# Patient Record
Sex: Female | Born: 1950
Health system: Southern US, Community
[De-identification: ages and names within clinical notes are randomized; demographics above are authoritative.]

## PROBLEM LIST (undated history)

## (undated) DIAGNOSIS — E039 Hypothyroidism, unspecified: Secondary | ICD-10-CM

## (undated) DIAGNOSIS — F1010 Alcohol abuse, uncomplicated: Secondary | ICD-10-CM

## (undated) DIAGNOSIS — Z952 Presence of prosthetic heart valve: Secondary | ICD-10-CM

## (undated) DIAGNOSIS — I350 Nonrheumatic aortic (valve) stenosis: Secondary | ICD-10-CM

## (undated) DIAGNOSIS — R011 Cardiac murmur, unspecified: Secondary | ICD-10-CM

## (undated) DIAGNOSIS — I7121 Aneurysm of the ascending aorta, without rupture: Secondary | ICD-10-CM

## (undated) DIAGNOSIS — I509 Heart failure, unspecified: Secondary | ICD-10-CM

## (undated) DIAGNOSIS — I959 Hypotension, unspecified: Secondary | ICD-10-CM

## (undated) DIAGNOSIS — J449 Chronic obstructive pulmonary disease, unspecified: Secondary | ICD-10-CM

## (undated) HISTORY — DX: Morbid (severe) obesity due to excess calories: E66.01

## (undated) HISTORY — DX: Cardiac murmur, unspecified: R01.1

## (undated) HISTORY — DX: Presence of prosthetic heart valve: Z95.2

---

## 2000-02-24 ENCOUNTER — Other Ambulatory Visit: Admission: RE | Admit: 2000-02-24 | Discharge: 2000-02-24 | Payer: Self-pay | Admitting: Obstetrics and Gynecology

## 2001-06-21 ENCOUNTER — Other Ambulatory Visit: Admission: RE | Admit: 2001-06-21 | Discharge: 2001-06-21 | Payer: Self-pay | Admitting: Obstetrics and Gynecology

## 2002-09-27 ENCOUNTER — Other Ambulatory Visit: Admission: RE | Admit: 2002-09-27 | Discharge: 2002-09-27 | Payer: Self-pay | Admitting: Obstetrics and Gynecology

## 2006-07-14 ENCOUNTER — Ambulatory Visit: Payer: Self-pay | Admitting: Family Medicine

## 2007-04-02 ENCOUNTER — Ambulatory Visit: Payer: Self-pay | Admitting: Family Medicine

## 2008-12-20 ENCOUNTER — Ambulatory Visit: Payer: Self-pay | Admitting: Family Medicine

## 2010-06-06 ENCOUNTER — Ambulatory Visit: Payer: Self-pay | Admitting: Family Medicine

## 2011-01-14 ENCOUNTER — Telehealth: Payer: Self-pay | Admitting: Family Medicine

## 2011-01-14 NOTE — Telephone Encounter (Signed)
Faxed refill for med 0 refills

## 2011-02-14 ENCOUNTER — Encounter: Payer: Self-pay | Admitting: Family Medicine

## 2011-02-14 DIAGNOSIS — J45909 Unspecified asthma, uncomplicated: Secondary | ICD-10-CM | POA: Insufficient documentation

## 2011-02-14 DIAGNOSIS — R011 Cardiac murmur, unspecified: Secondary | ICD-10-CM | POA: Insufficient documentation

## 2011-02-14 DIAGNOSIS — J309 Allergic rhinitis, unspecified: Secondary | ICD-10-CM | POA: Insufficient documentation

## 2011-02-14 DIAGNOSIS — F17200 Nicotine dependence, unspecified, uncomplicated: Secondary | ICD-10-CM

## 2011-03-12 ENCOUNTER — Other Ambulatory Visit: Payer: Self-pay | Admitting: Family Medicine

## 2011-03-13 ENCOUNTER — Other Ambulatory Visit: Payer: Self-pay

## 2011-03-13 MED ORDER — ALBUTEROL SULFATE HFA 108 (90 BASE) MCG/ACT IN AERS: 2.0000 | INHALATION_SPRAY | Freq: Four times a day (QID) | RESPIRATORY_TRACT | Status: DC | PRN

## 2011-03-14 ENCOUNTER — Telehealth: Payer: Self-pay | Admitting: Family Medicine

## 2011-03-14 NOTE — Telephone Encounter (Signed)
PT INFORMED-LM 

## 2011-07-16 ENCOUNTER — Other Ambulatory Visit: Payer: Self-pay | Admitting: Family Medicine

## 2011-07-16 NOTE — Telephone Encounter (Signed)
Is this okay?

## 2011-07-16 NOTE — Telephone Encounter (Signed)
Her inhaler with the night. She needs an appointment

## 2011-07-22 ENCOUNTER — Telehealth: Payer: Self-pay | Admitting: Family Medicine

## 2011-07-22 MED ORDER — ALBUTEROL SULFATE HFA 108 (90 BASE) MCG/ACT IN AERS: 2.0000 | INHALATION_SPRAY | Freq: Four times a day (QID) | RESPIRATORY_TRACT | Status: DC | PRN

## 2011-07-22 NOTE — Telephone Encounter (Signed)
Ventolin called in

## 2011-09-06 ENCOUNTER — Other Ambulatory Visit: Payer: Self-pay | Admitting: Family Medicine

## 2011-09-08 NOTE — Telephone Encounter (Signed)
She needs a follow-up appointment 

## 2011-09-08 NOTE — Telephone Encounter (Signed)
Is this okay?

## 2011-11-28 ENCOUNTER — Other Ambulatory Visit: Payer: Self-pay | Admitting: Family Medicine

## 2012-01-23 ENCOUNTER — Ambulatory Visit (INDEPENDENT_AMBULATORY_CARE_PROVIDER_SITE_OTHER): Payer: Self-pay | Admitting: Medical

## 2012-01-23 ENCOUNTER — Encounter: Payer: Self-pay | Admitting: Medical

## 2012-01-23 VITALS — BP 122/82 | HR 100 | Temp 98.0°F | Resp 16 | Wt 181.0 lb

## 2012-01-23 DIAGNOSIS — J029 Acute pharyngitis, unspecified: Secondary | ICD-10-CM

## 2012-01-23 DIAGNOSIS — J069 Acute upper respiratory infection, unspecified: Secondary | ICD-10-CM

## 2012-01-23 DIAGNOSIS — J45909 Unspecified asthma, uncomplicated: Secondary | ICD-10-CM

## 2012-01-23 MED ORDER — VENTOLIN HFA 108 (90 BASE) MCG/ACT IN AERS: 2.0000 | INHALATION_SPRAY | Freq: Four times a day (QID) | RESPIRATORY_TRACT | Status: DC | PRN

## 2012-01-23 MED ORDER — AMOXICILLIN 875 MG PO TABS: 875.0000 mg | ORAL_TABLET | Freq: Two times a day (BID) | ORAL | Status: AC

## 2012-01-23 NOTE — Progress Notes (Signed)
Subjective:  Cathy Moody is a 61 y.o. female who presents for 1 week hx/o cold symptoms. She reports cough, sometimes productive, runny nose, congestion, generally feeling ill, bad sore throat, headache, and occasional wheezing.   She notes 2 grandchildren recent sick contacts, 1 with strep +, 1 with bad sore throat, likely strep as well.  She is using Delsym with no improvement.   Needs refill on inhaler.  In the last week or so, having to use the inhaler more.  No other aggravating or relieving factors.  No other c/o.  The following portions of the patient's history were reviewed and updated as appropriate: allergies, current medications, past family history, past medical history, past social history, past surgical history and problem list.  ROS: Gen: no fever, chills, wt gain Heart: no CP, palpations, edema Lungs: occasional SOB; no calve pain, hemoptysis GI: negative  Objective:   Filed Vitals:   01/23/12 1111  BP: 122/82  Pulse: 100  Temp: 98 F (36.7 C)  Resp: 16    General appearance: Alert, WD/WN, no distress, ill appearing                             Skin: warm, no rash, no diaphoresis                           Head: no sinus tenderness                            Eyes: conjunctiva normal, corneas clear, PERRLA                            Ears: pearly TMs, external ear canals normal                          Nose: septum midline, turbinates swollen, with erythema             Mouth/throat: MMM, tongue normal, moderate pharyngeal erythema, +post nasal drainage                           Neck: supple, no adenopathy, no thyromegaly, non tender                          Heart: RRR, normal S1, S2, no murmurs                         Lungs: +bronchial breath sounds, +scattered rhonchi, no wheezes, no rales                Extremities: no edema, nontender     Assessment and Plan:   Encounter Diagnoses  Name Primary?  . URI (upper respiratory infection) Yes  . Pharyngitis   .  Asthma    Prescription given today for Amoxicillin as below.  Discussed diagnosis and treatment.  Suggested symptomatic OTC remedies for cough and congestion.  Nasal saline spray for nasal congestion.  Tylenol or Ibuprofen OTC for fever and malaise.  Refilled Ventolin.  Call/return in 2-3 days if symptoms are worse or not improving.    Advised to return soon for fasting labs and physical as she is past due.  Return if having to use inhaler more than  3 times weekly in general.

## 2012-02-25 ENCOUNTER — Other Ambulatory Visit: Payer: Self-pay | Admitting: Medical

## 2012-02-25 NOTE — Telephone Encounter (Signed)
Cathy Moody is this all right to refill.

## 2012-02-25 NOTE — Telephone Encounter (Signed)
Message copied by Janeice Robinson on Wed Feb 25, 2012  1:50 PM ------      Message from: Jac Canavan      Created: Wed Feb 25, 2012 12:07 PM       i sent refill as requested, but if using at least 3 times weekly need to come in and discussed better asthma control.  Also, did she get improved over the recent time i saw her?

## 2012-02-25 NOTE — Telephone Encounter (Signed)
Patient states that she did get better since her last visit. She states the reason why she requested a refill was because she is going out of town until August. [place]

## 2012-05-25 ENCOUNTER — Other Ambulatory Visit: Payer: Self-pay | Admitting: Medical

## 2012-05-25 MED ORDER — ALBUTEROL SULFATE HFA 108 (90 BASE) MCG/ACT IN AERS: 2.0000 | INHALATION_SPRAY | Freq: Four times a day (QID) | RESPIRATORY_TRACT | Status: DC | PRN

## 2012-05-25 NOTE — Telephone Encounter (Signed)
IS THIS OKAY REFILL? LAST OV WAS ON 12/2011

## 2012-05-25 NOTE — Telephone Encounter (Signed)
inhaler sent.  Due for CPX

## 2012-07-14 ENCOUNTER — Other Ambulatory Visit: Payer: Self-pay | Admitting: Medical

## 2012-07-15 ENCOUNTER — Telehealth: Payer: Self-pay | Admitting: Internal Medicine

## 2012-07-15 MED ORDER — ALBUTEROL SULFATE HFA 108 (90 BASE) MCG/ACT IN AERS: 2.0000 | INHALATION_SPRAY | Freq: Four times a day (QID) | RESPIRATORY_TRACT | Status: DC | PRN

## 2012-07-15 NOTE — Telephone Encounter (Signed)
Sent med in 

## 2012-08-19 ENCOUNTER — Other Ambulatory Visit: Payer: Self-pay | Admitting: Medical

## 2012-09-21 ENCOUNTER — Other Ambulatory Visit: Payer: Self-pay | Admitting: Medical

## 2012-09-21 NOTE — Telephone Encounter (Signed)
IS this okay to refill? 

## 2012-09-22 NOTE — Telephone Encounter (Signed)
I left the patient a message about her medication and that she is due for a CPX. CLS

## 2012-09-22 NOTE — Telephone Encounter (Signed)
Needs CPX appt. 

## 2012-11-25 ENCOUNTER — Other Ambulatory Visit: Payer: Self-pay | Admitting: Medical

## 2013-02-03 ENCOUNTER — Telehealth: Payer: Self-pay | Admitting: Internal Medicine

## 2013-02-03 NOTE — Telephone Encounter (Signed)
Pt states she needs a refill on ventolin. i denied it yesterday due she hasnt been in since 5/13 and she states that she will not be in town for another 6 weeks and wants to know if you will fill it. [facility] on [address]

## 2013-02-04 ENCOUNTER — Other Ambulatory Visit: Payer: Self-pay | Admitting: Internal Medicine

## 2013-02-04 ENCOUNTER — Other Ambulatory Visit: Payer: Self-pay | Admitting: Medical

## 2013-02-04 MED ORDER — ALBUTEROL SULFATE HFA 108 (90 BASE) MCG/ACT IN AERS: 2.0000 | INHALATION_SPRAY | Freq: Four times a day (QID) | RESPIRATORY_TRACT | Status: DC | PRN

## 2013-02-04 NOTE — Telephone Encounter (Signed)
She is due for general physical and if she needs to come in for asthma now we can work her in.  If absolutely needed, call out 1 inhaler, but we have to see patients at least once yearly regarding medicatoins.    (If she wants it sent to [facility], doesn't that mean she is in town?)

## 2013-02-04 NOTE — Telephone Encounter (Signed)
Sent in med. Pt knows she needs an appt and that's when she said she wont be in town for 6 more weeks

## 2013-02-04 NOTE — Telephone Encounter (Signed)
She said that she wanted it called in and her son i believe is going to see her and take it to her

## 2013-03-21 ENCOUNTER — Telehealth: Payer: Self-pay | Admitting: Medical

## 2013-03-21 NOTE — Telephone Encounter (Signed)
Needs appt, last visit over a year ago

## 2013-03-23 NOTE — Telephone Encounter (Signed)
CALLED PT TWICE TO LET HER KNOW THAT SHE NEEDS TO COME IN FOR APPT. LEFT MESSAGE TO CALL

## 2013-07-29 ENCOUNTER — Encounter: Payer: Self-pay | Admitting: Family Medicine

## 2013-07-29 ENCOUNTER — Encounter (HOSPITAL_COMMUNITY): Payer: Self-pay | Admitting: Emergency Medicine

## 2013-07-29 ENCOUNTER — Ambulatory Visit (INDEPENDENT_AMBULATORY_CARE_PROVIDER_SITE_OTHER): Payer: BC Managed Care – PPO | Admitting: Family Medicine

## 2013-07-29 ENCOUNTER — Inpatient Hospital Stay (HOSPITAL_COMMUNITY)
Admission: EM | Admit: 2013-07-29 | Discharge: 2013-08-13 | DRG: 266 | Disposition: A | Discharge status: Home / Self Care | Payer: BC Managed Care – PPO | Attending: Surgery | Admitting: Surgery

## 2013-07-29 ENCOUNTER — Emergency Department (HOSPITAL_COMMUNITY): Payer: BC Managed Care – PPO

## 2013-07-29 VITALS — BP 150/90 | HR 124 | Ht <= 58 in | Wt 189.0 lb

## 2013-07-29 DIAGNOSIS — J4489 Other specified chronic obstructive pulmonary disease: Secondary | ICD-10-CM | POA: Diagnosis present

## 2013-07-29 DIAGNOSIS — Z833 Family history of diabetes mellitus: Secondary | ICD-10-CM

## 2013-07-29 DIAGNOSIS — I2789 Other specified pulmonary heart diseases: Secondary | ICD-10-CM | POA: Diagnosis present

## 2013-07-29 DIAGNOSIS — F411 Generalized anxiety disorder: Secondary | ICD-10-CM | POA: Clinically undetermined

## 2013-07-29 DIAGNOSIS — I359 Nonrheumatic aortic valve disorder, unspecified: Secondary | ICD-10-CM | POA: Diagnosis present

## 2013-07-29 DIAGNOSIS — F1020 Alcohol dependence, uncomplicated: Secondary | ICD-10-CM | POA: Diagnosis present

## 2013-07-29 DIAGNOSIS — I5021 Acute systolic (congestive) heart failure: Secondary | ICD-10-CM

## 2013-07-29 DIAGNOSIS — F17201 Nicotine dependence, unspecified, in remission: Secondary | ICD-10-CM

## 2013-07-29 DIAGNOSIS — R9431 Abnormal electrocardiogram [ECG] [EKG]: Secondary | ICD-10-CM

## 2013-07-29 DIAGNOSIS — R0600 Dyspnea, unspecified: Secondary | ICD-10-CM

## 2013-07-29 DIAGNOSIS — J449 Chronic obstructive pulmonary disease, unspecified: Secondary | ICD-10-CM | POA: Diagnosis present

## 2013-07-29 DIAGNOSIS — L259 Unspecified contact dermatitis, unspecified cause: Secondary | ICD-10-CM | POA: Diagnosis present

## 2013-07-29 DIAGNOSIS — I428 Other cardiomyopathies: Secondary | ICD-10-CM | POA: Diagnosis present

## 2013-07-29 DIAGNOSIS — I509 Heart failure, unspecified: Secondary | ICD-10-CM | POA: Diagnosis present

## 2013-07-29 DIAGNOSIS — R609 Edema, unspecified: Secondary | ICD-10-CM

## 2013-07-29 DIAGNOSIS — Z952 Presence of prosthetic heart valve: Secondary | ICD-10-CM

## 2013-07-29 DIAGNOSIS — F1010 Alcohol abuse, uncomplicated: Secondary | ICD-10-CM | POA: Diagnosis present

## 2013-07-29 DIAGNOSIS — J45909 Unspecified asthma, uncomplicated: Secondary | ICD-10-CM

## 2013-07-29 DIAGNOSIS — D7589 Other specified diseases of blood and blood-forming organs: Secondary | ICD-10-CM | POA: Diagnosis present

## 2013-07-29 DIAGNOSIS — Z720 Tobacco use: Secondary | ICD-10-CM

## 2013-07-29 DIAGNOSIS — N390 Urinary tract infection, site not specified: Secondary | ICD-10-CM | POA: Diagnosis not present

## 2013-07-29 DIAGNOSIS — I350 Nonrheumatic aortic (valve) stenosis: Secondary | ICD-10-CM | POA: Diagnosis present

## 2013-07-29 DIAGNOSIS — I2510 Atherosclerotic heart disease of native coronary artery without angina pectoris: Secondary | ICD-10-CM | POA: Diagnosis present

## 2013-07-29 DIAGNOSIS — E0781 Sick-euthyroid syndrome: Secondary | ICD-10-CM | POA: Diagnosis not present

## 2013-07-29 DIAGNOSIS — I272 Pulmonary hypertension, unspecified: Secondary | ICD-10-CM | POA: Diagnosis present

## 2013-07-29 DIAGNOSIS — I5023 Acute on chronic systolic (congestive) heart failure: Principal | ICD-10-CM | POA: Diagnosis present

## 2013-07-29 DIAGNOSIS — Z006 Encounter for examination for normal comparison and control in clinical research program: Secondary | ICD-10-CM

## 2013-07-29 DIAGNOSIS — L408 Other psoriasis: Secondary | ICD-10-CM | POA: Diagnosis present

## 2013-07-29 DIAGNOSIS — R011 Cardiac murmur, unspecified: Secondary | ICD-10-CM

## 2013-07-29 DIAGNOSIS — I959 Hypotension, unspecified: Secondary | ICD-10-CM

## 2013-07-29 DIAGNOSIS — Z6835 Body mass index (BMI) 35.0-35.9, adult: Secondary | ICD-10-CM

## 2013-07-29 DIAGNOSIS — F17200 Nicotine dependence, unspecified, uncomplicated: Secondary | ICD-10-CM

## 2013-07-29 DIAGNOSIS — R0609 Other forms of dyspnea: Secondary | ICD-10-CM

## 2013-07-29 DIAGNOSIS — J9620 Acute and chronic respiratory failure, unspecified whether with hypoxia or hypercapnia: Secondary | ICD-10-CM | POA: Diagnosis present

## 2013-07-29 HISTORY — DX: Heart failure, unspecified: I50.9

## 2013-07-29 HISTORY — DX: Hypotension, unspecified: I95.9

## 2013-07-29 HISTORY — DX: Alcohol abuse, uncomplicated: F10.10

## 2013-07-29 LAB — BASIC METABOLIC PANEL
BUN: 9 mg/dL (ref 6–23)
Calcium: 9 mg/dL (ref 8.4–10.5)
GFR calc Af Amer: >90 mL/min (ref >90)
GFR calc non Af Amer: >90 mL/min (ref >90)
Glucose, Bld: 126 mg/dL — ABNORMAL HIGH (ref 70–99)
Potassium: 4.3 mEq/L (ref 3.5–5.1)

## 2013-07-29 LAB — CBC
HCT: 43.9 % (ref 36.0–46.0)
HCT: 44.5 % (ref 36.0–46.0)
MCV: 100 fL (ref 78.0–100.0)
MCV: 102.3 fL — ABNORMAL HIGH (ref 78.0–100.0)
Platelets: 330 10*3/uL (ref 150–400)
Platelets: 337 10*3/uL (ref 150–400)
RBC: 4.39 MIL/uL (ref 3.87–5.11)
RBC: 4.35 MIL/uL (ref 3.87–5.11)
RDW: 14.2 % (ref 11.5–15.5)
RDW: 14.1 % (ref 11.5–15.5)
WBC: 12 10*3/uL — ABNORMAL HIGH (ref 4.0–10.5)
WBC: 10.2 10*3/uL (ref 4.0–10.5)

## 2013-07-29 LAB — TSH: TSH: 7.306 u[IU]/mL — ABNORMAL HIGH (ref 0.350–4.500)

## 2013-07-29 LAB — TROPONIN I: Troponin I: <0.3 ng/mL (ref <0.30)

## 2013-07-29 LAB — HEMOGLOBIN A1C: Hgb A1c MFr Bld: 6.3 % — ABNORMAL HIGH (ref <5.7)

## 2013-07-29 LAB — CREATININE, SERUM
Creatinine, Ser: 0.82 mg/dL (ref 0.50–1.10)
GFR calc Af Amer: 88 mL/min — ABNORMAL LOW (ref >90)

## 2013-07-29 LAB — PRO B NATRIURETIC PEPTIDE: Pro B Natriuretic peptide (BNP): 26344 pg/mL — ABNORMAL HIGH (ref 0–125)

## 2013-07-29 MED ORDER — SODIUM CHLORIDE 0.9 % IV SOLN: 250.0000 mL | INTRAVENOUS | Status: DC | PRN

## 2013-07-29 MED ORDER — SODIUM CHLORIDE 0.9 % IJ SOLN
3.0000 mL | Freq: Two times a day (BID) | INTRAMUSCULAR | Status: DC
  Administered 2013-07-30 – 2013-08-01 (×3): 3 mL via INTRAVENOUS

## 2013-07-29 MED ORDER — FUROSEMIDE 10 MG/ML IJ SOLN: 40.0000 mg | Freq: Four times a day (QID) | INTRAMUSCULAR | Status: DC

## 2013-07-29 MED ORDER — ENOXAPARIN SODIUM 40 MG/0.4ML ~~LOC~~ SOLN
40.0000 mg | SUBCUTANEOUS | Status: DC
  Filled 2013-07-29: qty 0.4

## 2013-07-29 MED ORDER — LORAZEPAM 2 MG/ML IJ SOLN
1.0000 mg | Freq: Four times a day (QID) | INTRAMUSCULAR | Status: DC | PRN
  Administered 2013-07-30: 1 mg via INTRAVENOUS
  Filled 2013-07-29 (×2): qty 1

## 2013-07-29 MED ORDER — FUROSEMIDE 10 MG/ML IJ SOLN
80.0000 mg | Freq: Once | INTRAMUSCULAR | Status: AC
  Administered 2013-07-29: 80 mg via INTRAVENOUS
  Filled 2013-07-29: qty 8

## 2013-07-29 MED ORDER — VITAMIN B-1 100 MG PO TABS
100.0000 mg | ORAL_TABLET | Freq: Every day | ORAL | Status: DC
  Administered 2013-07-29 – 2013-07-31 (×3): 100 mg via ORAL
  Filled 2013-07-29 (×4): qty 1

## 2013-07-29 MED ORDER — FUROSEMIDE 10 MG/ML IJ SOLN: 40.0000 mg | Freq: Two times a day (BID) | INTRAMUSCULAR | Status: DC

## 2013-07-29 MED ORDER — FUROSEMIDE 10 MG/ML IJ SOLN
40.0000 mg | Freq: Two times a day (BID) | INTRAMUSCULAR | Status: DC
  Administered 2013-07-29 – 2013-07-31 (×4): 40 mg via INTRAVENOUS
  Filled 2013-07-29 (×8): qty 4

## 2013-07-29 MED ORDER — ONDANSETRON HCL 4 MG/2ML IJ SOLN: 4.0000 mg | Freq: Four times a day (QID) | INTRAMUSCULAR | Status: DC | PRN

## 2013-07-29 MED ORDER — ACETAMINOPHEN 325 MG PO TABS
650.0000 mg | ORAL_TABLET | ORAL | Status: DC | PRN
  Administered 2013-07-30 – 2013-08-06 (×9): 650 mg via ORAL
  Filled 2013-07-29 (×5): qty 2

## 2013-07-29 MED ORDER — ADULT MULTIVITAMIN W/MINERALS CH
1.0000 | ORAL_TABLET | Freq: Every day | ORAL | Status: DC
  Administered 2013-07-29 – 2013-08-05 (×7): 1 via ORAL
  Filled 2013-07-29 (×12): qty 1

## 2013-07-29 MED ORDER — POTASSIUM CHLORIDE CRYS ER 20 MEQ PO TBCR
20.0000 meq | EXTENDED_RELEASE_TABLET | Freq: Every day | ORAL | Status: DC
  Filled 2013-07-29: qty 1

## 2013-07-29 MED ORDER — LORAZEPAM 2 MG/ML IJ SOLN
0.5000 mg | INTRAMUSCULAR | Status: DC | PRN
  Administered 2013-07-29: 0.5 mg via INTRAVENOUS
  Filled 2013-07-29: qty 1

## 2013-07-29 MED ORDER — LORAZEPAM 1 MG PO TABS
1.0000 mg | ORAL_TABLET | Freq: Four times a day (QID) | ORAL | Status: DC | PRN
  Administered 2013-07-29: 1 mg via ORAL
  Filled 2013-07-29: qty 1

## 2013-07-29 MED ORDER — ASPIRIN 81 MG PO CHEW
324.0000 mg | CHEWABLE_TABLET | Freq: Once | ORAL | Status: AC
  Administered 2013-07-29: 324 mg via ORAL
  Filled 2013-07-29: qty 4

## 2013-07-29 MED ORDER — ONDANSETRON HCL 4 MG PO TABS: 4.0000 mg | ORAL_TABLET | Freq: Four times a day (QID) | ORAL | Status: DC | PRN

## 2013-07-29 MED ORDER — ASPIRIN EC 81 MG PO TBEC
81.0000 mg | DELAYED_RELEASE_TABLET | Freq: Every day | ORAL | Status: DC
  Administered 2013-07-29: 81 mg via ORAL
  Filled 2013-07-29 (×2): qty 1

## 2013-07-29 MED ORDER — MORPHINE SULFATE 2 MG/ML IJ SOLN: 2.0000 mg | INTRAMUSCULAR | Status: DC | PRN

## 2013-07-29 MED ORDER — HEPARIN SODIUM (PORCINE) 5000 UNIT/ML IJ SOLN
5000.0000 [IU] | Freq: Three times a day (TID) | INTRAMUSCULAR | Status: DC
  Administered 2013-07-29 – 2013-08-01 (×8): 5000 [IU] via SUBCUTANEOUS
  Filled 2013-07-29 (×11): qty 1

## 2013-07-29 MED ORDER — THIAMINE HCL 100 MG/ML IJ SOLN
100.0000 mg | Freq: Every day | INTRAMUSCULAR | Status: DC
  Filled 2013-07-29 (×4): qty 1

## 2013-07-29 MED ORDER — ACETAMINOPHEN 650 MG RE SUPP: 650.0000 mg | Freq: Four times a day (QID) | RECTAL | Status: DC | PRN

## 2013-07-29 MED ORDER — ACETAMINOPHEN 325 MG PO TABS
650.0000 mg | ORAL_TABLET | Freq: Four times a day (QID) | ORAL | Status: DC | PRN
  Filled 2013-07-29 (×4): qty 2

## 2013-07-29 MED ORDER — ASPIRIN EC 81 MG PO TBEC
81.0000 mg | DELAYED_RELEASE_TABLET | Freq: Every day | ORAL | Status: DC
  Administered 2013-07-30 – 2013-08-01 (×3): 81 mg via ORAL
  Filled 2013-07-29 (×3): qty 1

## 2013-07-29 MED ORDER — SODIUM CHLORIDE 0.9 % IJ SOLN
3.0000 mL | Freq: Two times a day (BID) | INTRAMUSCULAR | Status: DC
  Administered 2013-07-29 – 2013-08-01 (×6): 3 mL via INTRAVENOUS

## 2013-07-29 MED ORDER — FOLIC ACID 1 MG PO TABS
1.0000 mg | ORAL_TABLET | Freq: Every day | ORAL | Status: DC
  Administered 2013-07-29 – 2013-08-08 (×10): 1 mg via ORAL
  Filled 2013-07-29 (×12): qty 1

## 2013-07-29 MED ORDER — SODIUM CHLORIDE 0.9 % IJ SOLN: 3.0000 mL | INTRAMUSCULAR | Status: DC | PRN

## 2013-07-29 NOTE — Progress Notes (Signed)
   Subjective:    Patient ID: Cathy Moody, female    DOB: 07/20/1951, 62 y.o.   MRN: [id number]  HPI One month ago she had difficulty with cough, congestion, fever and was given a five-day course of Augmentin. She states she did get better from this. She does have a history of smoking. Approximately 2 weeks ago she developed increased difficulty with shortness of breath followed one week later by peripheral edema, PND and DOE; she also complained of upper chest tightness but no definite chest pain. She came here for evaluation. She was noted to desaturate with activity down to a pulse ox of 80 but did respond to oxygen having a pulse ox in the 97 range.   Review of Systems     Objective:   Physical Exam Alert and slightly tachypneic. Cardiac exam does show a tachycardia. EKG shows evidence of possible inferior as well as anteroseptal MI. T-wave inversions in V6.      Assessment & Plan:  PND (paroxysmal nocturnal dyspnea)  DOE (dyspnea on exertion)  Peripheral edema  Abnormal finding on EKG  she will be sent immediately to the emergency room. I explained that she probably had a heart attack within the last several weeks specifically one week ago.

## 2013-07-29 NOTE — H&P (Addendum)
[facility] History and Physical  [name]:[id number] DOB: Aug 06, 1951 DOA: 07/29/2013  Referring physician:  PCP: Ernst Breach, PA-C  Specialists:   Chief Complaint: SOB, DOE  HPI: Cathy Moody is a 62 y.o. female with PMH of asthma, alcoholism, tobacco use presented with SOB, progressive DOE, PND, orthopnea, associated with LE edema for the last few weeks; she was hypoxic during outpatient visit by PCP and referred to ED for further evaluation;    Review of Systems: The patient denies anorexia, fever, weight loss,, vision loss, decreased hearing, hoarseness, balance deficits, hemoptysis, abdominal pain, melena, hematochezia, severe indigestion/heartburn, hematuria, incontinence, genital sores, muscle weakness, suspicious skin lesions, transient blindness, difficulty walking, depression, unusual weight change, abnormal bleeding, enlarged lymph nodes, angioedema, and breast masses.   Past Medical History  Diagnosis Date  . Asthma   . Heart murmur    History reviewed. No pertinent past surgical history. Social History:  reports that she has been smoking Cigarettes.  She has been smoking about 0.00 packs per day. She does not have any smokeless tobacco history on file. She reports that she drinks alcohol. She reports that she does not use illicit drugs. Home: where does patient live--home, ALF, SNF? and with whom if at home? Yes: Can patient participate in ADLs?  No Known Allergies  Family History  Problem Relation Age of Onset  . Diabetes Paternal Grandmother     (be sure to complete)  Prior to Admission medications   Medication Sig Start Date End Date Taking? Authorizing Provider  albuterol (PROVENTIL HFA;VENTOLIN HFA) 108 (90 BASE) MCG/ACT inhaler Inhale 2 puffs into the lungs every 6 (six) hours as needed for wheezing or shortness of breath.   Yes Historical Provider, MD   Physical Exam: Filed Vitals:   07/29/13 1315  BP: 93/65  Pulse: 115  Temp:    Resp: 33     General:  alert  Eyes: EOM-I  ENT: no oral ulcers  Neck: supple +JVD  Cardiovascular: s1,s2 distant HS; + systolic, + diastolic mr   Respiratory: BL LL carckles  Abdomen: soft, distended, NT  Skin: no rash  Musculoskeletal: no LE edema  Psychiatric: no hallucinations   Neurologic: CN 2-12 intact; motor 5/5 symmetric   Labs on Admission:  Basic Metabolic Panel:  Recent Labs Lab 07/29/13 1137  NA 135  K 4.3  CL 97  CO2 25  GLUCOSE 126*  BUN 9  CREATININE 0.71  CALCIUM 9.0   Liver Function Tests: No results found for this basename: AST, ALT, ALKPHOS, BILITOT, PROT, ALBUMIN,  in the last 168 hours No results found for this basename: LIPASE, AMYLASE,  in the last 168 hours No results found for this basename: AMMONIA,  in the last 168 hours CBC:  Recent Labs Lab 07/29/13 1137  WBC 12.0*  HGB 14.7  HCT 43.9  MCV 100.0  PLT 330   Cardiac Enzymes: No results found for this basename: CKTOTAL, CKMB, CKMBINDEX, TROPONINI,  in the last 168 hours  BNP (last 3 results)  Recent Labs  07/29/13 1138  PROBNP 26344.0*   CBG: No results found for this basename: GLUCAP,  in the last 168 hours  Radiological Exams on Admission: Dg Chest Port 1 View  07/29/2013   CLINICAL DATA:  Shortness of breath.  Tachycardia.  EXAM: PORTABLE CHEST - 1 VIEW  COMPARISON:  None.  FINDINGS: Severe cardiomegaly is present. Cardiac echo should be considered as the possibility of pericardial effusion, cardiomyopathy, and/or valvular disease should be  considered in this patient. Mild pulmonary vascular prominence noted. Bilateral interstitial prominence with Kerley B-lines and bilateral alveolar prominence noted consistent with pulmonary edema. Right-sided pleural effusion is most likely present. No pneumothorax. No acute bony abnormality.  IMPRESSION: Findings consistent with congestive heart failure with pulmonary edema and right-sided pleural effusion. Cardiomegaly is  severe. Cardiac ECHO may prove useful for further evaluation as the possibility of pericardial effusion, cardiomyopathy, and/or valvular disease should be considered in this patient.   Electronically Signed   By: Maisie Fus  Register   On: 07/29/2013 11:38    EKG: Independently reviewed. Sinus tachy, q wave inferior; poor r wave progression   Assessment/Plan Active Problems:   Asthma   Alcohol abuse   Acute CHF  62 y.o. female with PMH of asthma, alcoholism, tobacco use presented with SOB, progressive DOE, PND, orthopnea, associated with LE edema found in acute CHF  1. Acute CHF; CXR: cardiomegaly, congestion; probable CAD vs cardiomyopathy due to valvular HD vs pericardial effusion -admit SDU: started IV lasix 40 Q6; may need IV dobutamine; daily weight, I/O; echo;  NiPPV; may need LHC: defer management to cardiology;  -check TSH, serial trop, ECG; A1c; stop etoh, smoking  2. Asthma; no wheezing on exam; cont prn bronchodilators   3. Substance abuse, tobacco, etoh;  -monitor on ciwa protocol; stop smoking     Cardiology;  if consultant consulted, please document name and whether formally or informally consulted  Code Status: full (must indicate code status--if unknown or must be presumed, indicate so) Family Communication: son, mother at the bedside (indicate person spoken with, if applicable, with phone number if by telephone) Disposition Plan: home when ready (indicate anticipated LOS)  Time spent: >35 minutes   Esperanza Sheets [facility] Pager [phone]  If 7PM-7AM, please contact night-coverage [email or web address] Password [email or web address] 07/29/2013, 2:19 PM

## 2013-07-29 NOTE — Addendum Note (Signed)
Addended by: [name] on: 07/29/2013 01:25 PM   Modules accepted: Orders

## 2013-07-29 NOTE — ED Notes (Signed)
hospitalist at bedside

## 2013-07-29 NOTE — ED Provider Notes (Signed)
CSN: [id number]     Arrival date & time 07/29/13  1057 History   First MD Initiated Contact with Patient 07/29/13 1112     Chief Complaint  Patient presents with  . Tachycardia  . Shortness of Breath    HPI  Patient presents here after being seen by her primary care physician this morning who she's had chest pain and back pain for about a week or 10 days she's had dyspnea PND or orthopnea the last 2-3 days using her physician this morning and referred here. She does not feel as though this is as much her only significant past history is asthma for which she uses bronchodilator. Her symptoms lie supine. She's had no fever. No exacerbating or alleviating factors other position changes.  Past Medical History  Diagnosis Date  . Asthma   . Heart murmur    History reviewed. No pertinent past surgical history. Family History  Problem Relation Age of Onset  . Diabetes Paternal Grandmother    History  Substance Use Topics  . Smoking status: Current Some Day Smoker    Types: Cigarettes  . Smokeless tobacco: Not on file  . Alcohol Use: Yes     Comment: Pt is weekend drinker   OB History   Grav Para Term Preterm Abortions TAB SAB Ect Mult Living                 Review of Systems  Constitutional: Negative for fever, chills, diaphoresis, appetite change and fatigue.  HENT: Negative for mouth sores, sore throat and trouble swallowing.   Eyes: Negative for visual disturbance.  Respiratory: Positive for chest tightness and shortness of breath. Negative for cough and wheezing.   Cardiovascular: Positive for chest pain.  Gastrointestinal: Negative for nausea, vomiting, abdominal pain, diarrhea and abdominal distention.  Endocrine: Negative for polydipsia, polyphagia and polyuria.  Genitourinary: Negative for dysuria, frequency and hematuria.  Musculoskeletal: Negative for gait problem.  Skin: Negative for color change, pallor and rash.  Neurological: Negative for dizziness, syncope,  light-headedness and headaches.  Hematological: Does not bruise/bleed easily.  Psychiatric/Behavioral: Negative for behavioral problems and confusion.    Allergies  Review of patient's allergies indicates no known allergies.  Home Medications  No current outpatient prescriptions on file. BP 104/70  Pulse 111  Temp(Src) 98.2 F (36.8 C)  Resp 33  Ht 5' (1.524 m)  Wt 187 lb 6.3 oz (85 kg)  BMI 36.60 kg/m2  SpO2 96% Physical Exam  Constitutional: She is oriented to person, place, and time. She appears well-developed and well-nourished. No distress.  Dyspneic. Not fatigued.  HENT:  Head: Normocephalic.  Eyes: Conjunctivae are normal. Pupils are equal, round, and reactive to light. No scleral icterus.  Neck: Normal range of motion. Neck supple. No thyromegaly present.  JVD at 45  Cardiovascular: Normal rate, regular rhythm, S1 normal and S2 normal.  Exam reveals no gallop and no friction rub.   No murmur heard. Heart regular. Sinus tachycardia on the monitor  Pulmonary/Chest: Effort normal and breath sounds normal. No respiratory distress. She has no wheezes. She has no rales.  Dependent diminished breath sounds with crackles/rales.  Abdominal: Soft. Bowel sounds are normal. She exhibits no distension. There is no tenderness. There is no rebound.  Musculoskeletal: Normal range of motion.  Neurological: She is alert and oriented to person, place, and time.  Skin: Skin is warm and dry. No rash noted.  Psychiatric: She has a normal mood and affect. Her behavior is normal.  ED Course  Procedures (including critical care time) Labs Review Labs Reviewed  CBC - Abnormal; Notable for the following:    WBC 12.0 (*)    All other components within normal limits  PRO B NATRIURETIC PEPTIDE - Abnormal; Notable for the following:    Pro B Natriuretic peptide (BNP) 26344.0 (*)    All other components within normal limits  BASIC METABOLIC PANEL - Abnormal; Notable for the following:     Glucose, Bld 126 (*)    All other components within normal limits  MRSA PCR SCREENING  POCT I-STAT TROPONIN I   Imaging Review Dg Chest Port 1 View  07/29/2013   CLINICAL DATA:  Shortness of breath.  Tachycardia.  EXAM: PORTABLE CHEST - 1 VIEW  COMPARISON:  None.  FINDINGS: Severe cardiomegaly is present. Cardiac echo should be considered as the possibility of pericardial effusion, cardiomyopathy, and/or valvular disease should be considered in this patient. Mild pulmonary vascular prominence noted. Bilateral interstitial prominence with Kerley B-lines and bilateral alveolar prominence noted consistent with pulmonary edema. Right-sided pleural effusion is most likely present. No pneumothorax. No acute bony abnormality.  IMPRESSION: Findings consistent with congestive heart failure with pulmonary edema and right-sided pleural effusion. Cardiomegaly is severe. Cardiac ECHO may prove useful for further evaluation as the possibility of pericardial effusion, cardiomyopathy, and/or valvular disease should be considered in this patient.   Electronically Signed   By: Maisie Fus  Register   On: 07/29/2013 11:38    EKG Interpretation    Date/Time:    Ventricular Rate:    PR Interval:    QRS Duration:   QT Interval:    QTC Calculation:   R Axis:     Text Interpretation:             EKG. Sinus tachycardia. Slow R wave progression. No ST changes.  MDM   1. CHF (congestive heart failure)   2. Acute CHF   3. Alcohol abuse   4. Asthma   5. Heart murmur   6. Smoker    Presents in congestive heart failure. EKG changes suggestive of a subacute infarct. Troponin not elevated. My suspicion is that this is a recent MI and new congestive heart failure. She was given aspirin. She was given IV Lasix. Not controlled her heart rate at this point as an outpatient tolerate beta blockers or calcium channel blockers. I discussed the case with cardiology.  Currently, they're seeing the patient.    Roney Marion, MD 07/29/13 [phone]

## 2013-07-29 NOTE — ED Notes (Signed)
Pt sent here by pcp for sob and tachycardia.  He feels she may have had an MI 1 week prior.  States x 1 week whenever she ambulates or lays back she feels sob.  Pt in tripod position.  HR 115.

## 2013-07-29 NOTE — Progress Notes (Signed)
  Echocardiogram 2D Echocardiogram has been performed.  Cathy Moody 07/29/2013, 6:15 PM

## 2013-07-29 NOTE — H&P (Addendum)
Chief Complaint: SOB  HPI: The patient is a 62 y/o female, who presents to the ER with a complaint of progressively worsening SOB over the past 4 weeks. Her only known medical conditions include asthma and a heart murmur.She is not followed regularly by a PCP. She states that she saw a cardiologist over 30 years ago for her murmur. She was told that it was benign and has not followed up since. She has a long-standing history of heavy tobacco abuse, having smoked over 50 years.    Her SOB started 4 weeks ago. It occurs mainly with exertion and has worsened. She notes associated orthopnea, PND and worsening LEE. She also endorses anginal type symptoms over the last several weeks, including bilateral neck tightness, jaw and back pain. Worse with exertion and relieved with rest. No associated n/v, diaphoresis, syncope/near syncope.   She initially presented to [facility] for evaluation and was noted to be hypoxic with O2 sats in the 80s, requiring supplemental O2. She was sent to [facility] for further evaluation. She is now breathing better on 4 L [place]. Currently pain free.   Past Medical History  Diagnosis Date  . Asthma   . Heart murmur     History reviewed. No pertinent past surgical history.  Family History  Problem Relation Age of Onset  . Diabetes Paternal Grandmother    Social History:  reports that she has been smoking Cigarettes.  She has been smoking about 0.00 packs per day. She does not have any smokeless tobacco history on file. She reports that she drinks alcohol. She reports that she does not use illicit drugs.  Allergies: No Known Allergies   (Not in a hospital admission)  Results for orders placed during the hospital encounter of 07/29/13 (from the past 48 hour(s))  CBC     Status: Abnormal   Collection Time    07/29/13 11:37 AM      Result Value Range   WBC 12.0 (*) 4.0 - 10.5 K/uL   RBC 4.39  3.87 - 5.11 MIL/uL   Hemoglobin 14.7  12.0 - 15.0  g/dL   HCT 16.1  09.6 - 04.5 %   MCV 100.0  78.0 - 100.0 fL   MCH 33.5  26.0 - 34.0 pg   MCHC 33.5  30.0 - 36.0 g/dL   RDW 40.9  81.1 - 91.4 %   Platelets 330  150 - 400 K/uL  BASIC METABOLIC PANEL     Status: Abnormal   Collection Time    07/29/13 11:37 AM      Result Value Range   Sodium 135  135 - 145 mEq/L   Potassium 4.3  3.5 - 5.1 mEq/L   Chloride 97  96 - 112 mEq/L   CO2 25  19 - 32 mEq/L   Glucose, Bld 126 (*) 70 - 99 mg/dL   BUN 9  6 - 23 mg/dL   Creatinine, Ser 7.82  0.50 - 1.10 mg/dL   Calcium 9.0  8.4 - 95.6 mg/dL   GFR calc non Af Amer >90  >90 mL/min   GFR calc Af Amer >90  >90 mL/min   Comment: (NOTE)     The eGFR has been calculated using the CKD EPI equation.     This calculation has not been validated in all clinical situations.     eGFR's persistently <90 mL/min signify possible Chronic Kidney     Disease.  PRO B NATRIURETIC PEPTIDE  Status: Abnormal   Collection Time    07/29/13 11:38 AM      Result Value Range   Pro B Natriuretic peptide (BNP) 26344.0 (*) 0 - 125 pg/mL  POCT I-STAT TROPONIN I     Status: None   Collection Time    07/29/13 11:43 AM      Result Value Range   Troponin i, poc 0.07  0.00 - 0.08 ng/mL   Comment 3            Comment: Due to the release kinetics of cTnI,     a negative result within the first hours     of the onset of symptoms does not rule out     myocardial infarction with certainty.     If myocardial infarction is still suspected,     repeat the test at appropriate intervals.   Dg Chest Port 1 View  07/29/2013   CLINICAL DATA:  Shortness of breath.  Tachycardia.  EXAM: PORTABLE CHEST - 1 VIEW  COMPARISON:  None.  FINDINGS: Severe cardiomegaly is present. Cardiac echo should be considered as the possibility of pericardial effusion, cardiomyopathy, and/or valvular disease should be considered in this patient. Mild pulmonary vascular prominence noted. Bilateral interstitial prominence with Kerley B-lines and bilateral  alveolar prominence noted consistent with pulmonary edema. Right-sided pleural effusion is most likely present. No pneumothorax. No acute bony abnormality.  IMPRESSION: Findings consistent with congestive heart failure with pulmonary edema and right-sided pleural effusion. Cardiomegaly is severe. Cardiac ECHO may prove useful for further evaluation as the possibility of pericardial effusion, cardiomyopathy, and/or valvular disease should be considered in this patient.   Electronically Signed   By: Maisie Fus  Register   On: 07/29/2013 11:38    Review of Systems  Constitutional: Positive for malaise/fatigue. Negative for fever, chills and diaphoresis.  Respiratory: Positive for shortness of breath.   Cardiovascular: Positive for chest pain, orthopnea, leg swelling and PND. Negative for palpitations.  Gastrointestinal: Negative for nausea and vomiting.  Musculoskeletal: Positive for back pain.  Neurological: Negative for loss of consciousness.  All other systems reviewed and are negative.    Blood pressure 110/53, pulse 125, temperature 98.2 F (36.8 C), resp. rate 33, height 5' (1.524 m), SpO2 98.00%. Physical Exam  Constitutional: She is oriented to person, place, and time. She appears well-developed and well-nourished. No distress.  Appears older than stated age   Neck: JVD present. Carotid bruit is not present.  Cardiovascular: Normal rate, regular rhythm and intact distal pulses.   Murmur (3/6 best heard at RUSB) heard. Respiratory: Effort normal. No respiratory distress.  Decreased breath sounds bilaterally  Musculoskeletal: She exhibits edema (bilateral 2+ lower extremity edema).  Neurological: She is alert and oriented to person, place, and time.  Skin: Skin is warm and dry. She is not diaphoretic.  Psychiatric: She has a normal mood and affect. Her behavior is normal.     Assessment/Plan Principal Problem:   Acute CHF Active Problems:   Asthma   Alcohol abuse   Tobacco  abuse  Plan: 62 y/o female presents with SOB, found to be in acute CHF. BNP elevated >26K. CXR consistent with CHF with pulmonary edema. Also question pericardial effusion. She is hemodynamically stable. I have ordered a STAT 2D echo. Will admit and will continue with high dose IV Lasix. Renal function is normal. Will order strict low sodium diet, daily weights and strict I/Os. Will also rule out for MI. EKG shows sinus tach but no acute changes.  Initial troponin is negative. Will continue to cycle enzymes x 3. She will need an ischemic eval, either NST vs Cath. Will also order Hgb A1c and lipid panel for tomorrow am. Dr. Eldridge Dace to follow.   BRITTAINY SIMMONS, PA-C 07/29/2013, 3:20 PM   I have examined the patient and reviewed assessment and plan and discussed with patient.  Agree with above as stated.  Patient with sx of systolic heart failure including SHOB, edema.  CXR shows cardiomegaly.  Urgent echo today to determine LVEF.  She will need a cardiac w/u for ischemia as noted above, possibly cath, after heart failure is treated.  Some sx concerning for angina, but could be from fluid overload.  She is critically ill at this point and will be going to the stepdown.  Rule out for MI.  She needs to stop smoking as well. Low salt diet explained as well.    Eval heart murmur with echo as well.  Alija Riano S.   I spoke with the patient about her echo results.  I explained to her that her heart function is reduced and she will likely need aortic valve intervention.  Heart failure consultation may be beneficial.  She will need cardiac cath.

## 2013-07-30 DIAGNOSIS — I350 Nonrheumatic aortic (valve) stenosis: Secondary | ICD-10-CM | POA: Diagnosis present

## 2013-07-30 DIAGNOSIS — I5021 Acute systolic (congestive) heart failure: Secondary | ICD-10-CM

## 2013-07-30 LAB — PULMONARY FUNCTION TEST
FEF 25-75 Post: 0.39 L/sec
FEF2575-%Change-Post: 2 %
FEF2575-%Pred-Post: 19 %
FEV1-%Pred-Post: 37 %
FEV1-%Pred-Pre: 36 %
FEV1-Post: 0.8 L
FEV1FVC-%Change-Post: 2 %
FEV6-%Pred-Pre: 49 %
FEV6-Pre: 1.32 L
FEV6FVC-%Pred-Post: 102 %
FEV6FVC-%Pred-Pre: 104 %
FVC-%Change-Post: 0 %
FVC-Pre: 1.32 L
Post FEV1/FVC ratio: 61 %
Pre FEV1/FVC ratio: 59 %
Pre FEV6/FVC Ratio: 100 %

## 2013-07-30 LAB — COMPREHENSIVE METABOLIC PANEL
AST: 20 U/L (ref 0–37)
CO2: 33 mEq/L — ABNORMAL HIGH (ref 19–32)
Calcium: 8.8 mg/dL (ref 8.4–10.5)
Creatinine, Ser: 0.9 mg/dL (ref 0.50–1.10)
GFR calc Af Amer: 78 mL/min — ABNORMAL LOW (ref >90)
GFR calc non Af Amer: 68 mL/min — ABNORMAL LOW (ref >90)
Glucose, Bld: 113 mg/dL — ABNORMAL HIGH (ref 70–99)

## 2013-07-30 LAB — T4, FREE: Free T4: 1.02 ng/dL (ref 0.80–1.80)

## 2013-07-30 LAB — CBC
HCT: 44.4 % (ref 36.0–46.0)
Hemoglobin: 14.4 g/dL (ref 12.0–15.0)
MCH: 33.8 pg (ref 26.0–34.0)
MCHC: 32.4 g/dL (ref 30.0–36.0)
Platelets: 309 10*3/uL (ref 150–400)
RBC: 4.26 MIL/uL (ref 3.87–5.11)
WBC: 9.8 10*3/uL (ref 4.0–10.5)

## 2013-07-30 LAB — TROPONIN I: Troponin I: <0.3 ng/mL (ref <0.30)

## 2013-07-30 MED ORDER — LEVALBUTEROL HCL 0.63 MG/3ML IN NEBU
0.6300 mg | INHALATION_SOLUTION | Freq: Four times a day (QID) | RESPIRATORY_TRACT | Status: DC | PRN
  Administered 2013-07-30 – 2013-08-02 (×4): 0.63 mg via RESPIRATORY_TRACT
  Filled 2013-07-30 (×4): qty 3

## 2013-07-30 MED ORDER — POTASSIUM CHLORIDE CRYS ER 20 MEQ PO TBCR
20.0000 meq | EXTENDED_RELEASE_TABLET | Freq: Two times a day (BID) | ORAL | Status: DC
  Administered 2013-07-30 – 2013-07-31 (×3): 20 meq via ORAL
  Filled 2013-07-30 (×3): qty 1

## 2013-07-30 MED ORDER — ZOLPIDEM TARTRATE 5 MG PO TABS
5.0000 mg | ORAL_TABLET | Freq: Every evening | ORAL | Status: DC | PRN
  Administered 2013-07-30 – 2013-08-07 (×6): 5 mg via ORAL
  Filled 2013-07-30 (×7): qty 1

## 2013-07-30 MED ORDER — LOSARTAN POTASSIUM 25 MG PO TABS
25.0000 mg | ORAL_TABLET | Freq: Every day | ORAL | Status: DC
  Administered 2013-07-30 – 2013-07-31 (×2): 25 mg via ORAL
  Filled 2013-07-30 (×3): qty 1

## 2013-07-30 NOTE — Progress Notes (Addendum)
[facility] PROGRESS NOTE  Cathy Moody ZOX:[id number] DOB: Jan 03, 1951 DOA: 07/29/2013 PCP: Ernst Breach, PA-C  Assessment/Plan: 62 y.o. female with PMH of asthma, alcoholism, tobacco use presented with SOB, progressive DOE, PND, orthopnea, associated with LE edema found in acute CHF   1. Acute decompensated CHF; echo: LVEF 25%; diffuse hypokinesia; AVA 0.6, RVSP 64 -improved on diuresis; I/O neg 3.0; cont IV diuresis'; daily weight, I/O; NiPPV; added ABR per cards; LHC, AVR eval per cardiology; appreciate the input   -expect worsening renal function; close monitor   2. Asthma/COPD; no wheezing on exam; cont prn bronchodilators; pend PFTs  3. Substance abuse, tobacco, etoh;  -monitor on ciwa protocol; stop smoking   4. Mild elevated TSH likely due to acute illness; free t4-normal;    Code Status: full Family Communication: boyfriend at the bedside; d/w her son mother  (indicate person spoken with, relationship, and if by phone, the number) Disposition Plan: pend improvement    Consultants:  cardiology   Procedures:  Echo: LVEF 25%; AVA 0.6  Antibiotics:  None  (indicate start date, and stop date if known)  HPI/Subjective: alert  Objective: Filed Vitals:   07/30/13 0800  BP:   Pulse:   Temp: 97.4 F (36.3 C)  Resp:     Intake/Output Summary (Last 24 hours) at 07/30/13 0848 Last data filed at 07/30/13 0800  Gross per 24 hour  Intake    330 ml  Output   2850 ml  Net  -2520 ml   Filed Weights   07/29/13 1528 07/30/13 0401  Weight: 85 kg (187 lb 6.3 oz) 84 kg (185 lb 3 oz)    Exam:   General:  alert  Cardiovascular: s1,s2 rrr, systolic, +diastolic mr   Respiratory: BL LL crackles   Abdomen: soft, nt, nd   Musculoskeletal: LE edema   Data Reviewed: Basic Metabolic Panel:  Recent Labs Lab 07/29/13 1137 07/29/13 1825 07/30/13 0345  NA 135  --  138  K 4.3  --  3.9  CL 97  --  95*  CO2 25  --  33*  GLUCOSE 126*  --  113*  BUN  9  --  10  CREATININE 0.71 0.82 0.90  CALCIUM 9.0  --  8.8   Liver Function Tests:  Recent Labs Lab 07/30/13 0345  AST 20  ALT 17  ALKPHOS 70  BILITOT 0.7  PROT 6.8  ALBUMIN 3.3*   No results found for this basename: LIPASE, AMYLASE,  in the last 168 hours No results found for this basename: AMMONIA,  in the last 168 hours CBC:  Recent Labs Lab 07/29/13 1137 07/29/13 1825 07/30/13 0345  WBC 12.0* 10.2 9.8  HGB 14.7 14.7 14.4  HCT 43.9 44.5 44.4  MCV 100.0 102.3* 104.2*  PLT 330 337 309   Cardiac Enzymes:  Recent Labs Lab 07/29/13 1820 07/29/13 2240 07/30/13 0345  TROPONINI <0.30 <0.30 <0.30   BNP (last 3 results)  Recent Labs  07/29/13 1138  PROBNP 26344.0*   CBG: No results found for this basename: GLUCAP,  in the last 168 hours  Recent Results (from the past 240 hour(s))  MRSA PCR SCREENING     Status: None   Collection Time    07/29/13  3:59 PM      Result Value Range Status   MRSA by PCR NEGATIVE  NEGATIVE Final   Comment:            The GeneXpert MRSA Assay (FDA  approved for NASAL specimens     only), is one component of a     comprehensive MRSA colonization     surveillance program. It is not     intended to diagnose MRSA     infection nor to guide or     monitor treatment for     MRSA infections.     Studies: Dg Chest Port 1 View  07/29/2013   CLINICAL DATA:  Shortness of breath.  Tachycardia.  EXAM: PORTABLE CHEST - 1 VIEW  COMPARISON:  None.  FINDINGS: Severe cardiomegaly is present. Cardiac echo should be considered as the possibility of pericardial effusion, cardiomyopathy, and/or valvular disease should be considered in this patient. Mild pulmonary vascular prominence noted. Bilateral interstitial prominence with Kerley B-lines and bilateral alveolar prominence noted consistent with pulmonary edema. Right-sided pleural effusion is most likely present. No pneumothorax. No acute bony abnormality.  IMPRESSION: Findings consistent with  congestive heart failure with pulmonary edema and right-sided pleural effusion. Cardiomegaly is severe. Cardiac ECHO may prove useful for further evaluation as the possibility of pericardial effusion, cardiomyopathy, and/or valvular disease should be considered in this patient.   Electronically Signed   By: Maisie Fus  Register   On: 07/29/2013 11:38    Scheduled Meds: . aspirin EC  81 mg Oral Daily  . aspirin EC  81 mg Oral Daily  . folic acid  1 mg Oral Daily  . furosemide  40 mg Intravenous BID  . heparin  5,000 Units Subcutaneous Q8H  . multivitamin with minerals  1 tablet Oral Daily  . potassium chloride  20 mEq Oral Daily  . sodium chloride  3 mL Intravenous Q12H  . sodium chloride  3 mL Intravenous Q12H  . thiamine  100 mg Oral Daily   Or  . thiamine  100 mg Intravenous Daily   Continuous Infusions:   Principal Problem:   Acute CHF Active Problems:   Asthma   Alcohol abuse   CHF (congestive heart failure)   Tobacco abuse    Time spent: >35 minutes     Esperanza Sheets  [facility] Pager [phone]. If 7PM-7AM, please contact night-coverage at [email or web address], password [facility] 07/30/2013, 8:48 AM  LOS: 1 day

## 2013-07-30 NOTE — Progress Notes (Signed)
Subjective:   62 y/o woman with obesity, COPD, ETOH and ongoing tobacco use admitted with a cute HF.   Echo 12/5  with biventricular dysfunction EF 25-30% Mild RV HK. Severe AS (Mean gradient 47mm HG)  Diuresed well. 2L yesterday. Weight down 2 pounds. Breathing better. No CP    Intake/Output Summary (Last 24 hours) at 07/30/13 1116 Last data filed at 07/30/13 0800  Gross per 24 hour  Intake    330 ml  Output   2850 ml  Net  -2520 ml    Current meds: . aspirin EC  81 mg Oral Daily  . folic acid  1 mg Oral Daily  . furosemide  40 mg Intravenous BID  . heparin  5,000 Units Subcutaneous Q8H  . multivitamin with minerals  1 tablet Oral Daily  . potassium chloride  20 mEq Oral BID  . sodium chloride  3 mL Intravenous Q12H  . sodium chloride  3 mL Intravenous Q12H  . thiamine  100 mg Oral Daily   Or  . thiamine  100 mg Intravenous Daily   Infusions:     Objective:  Blood pressure 102/53, pulse 100, temperature 97.4 F (36.3 C), temperature source Oral, resp. rate 15, height 5' (1.524 m), weight 84 kg (185 lb 3 oz), SpO2 95.00%. Weight change:   Physical Exam: Constitutional: She is oriented to person, place, and time. She appears well-developed and well-nourished. No distress.  Appears older than stated age Neck: JVD 10  Carotid bruit is not present.  Cardiovascular: Normal rate, regular rhythm and intact distal pulses.  Murmur (3/6 best heard at RUSB) heard. S2 markedly decreased Respiratory: tachypneic Markedly decreased breath sounds bilaterally  Musculoskeletal: 2+ edema Neurological: She is alert and oriented to person, place, and time.  Skin: Skin is warm and dry. She is not diaphoretic.  Psychiatric: She has a normal mood and affect. Her behavior is normal.    Telemetry: Sinus tach 110  Lab Results: Basic Metabolic Panel:  Recent Labs Lab 07/29/13 1137 07/29/13 1825 07/30/13 0345  NA 135  --  138  K 4.3  --  3.9  CL 97  --  95*  CO2 25  --  33*    GLUCOSE 126*  --  113*  BUN 9  --  10  CREATININE 0.71 0.82 0.90  CALCIUM 9.0  --  8.8   Liver Function Tests:  Recent Labs Lab 07/30/13 0345  AST 20  ALT 17  ALKPHOS 70  BILITOT 0.7  PROT 6.8  ALBUMIN 3.3*   No results found for this basename: LIPASE, AMYLASE,  in the last 168 hours No results found for this basename: AMMONIA,  in the last 168 hours CBC:  Recent Labs Lab 07/29/13 1137 07/29/13 1825 07/30/13 0345  WBC 12.0* 10.2 9.8  HGB 14.7 14.7 14.4  HCT 43.9 44.5 44.4  MCV 100.0 102.3* 104.2*  PLT 330 337 309   Cardiac Enzymes:  Recent Labs Lab 07/29/13 1820 07/29/13 2240 07/30/13 0345  TROPONINI <0.30 <0.30 <0.30   BNP: No components found with this basename: POCBNP,  CBG: No results found for this basename: GLUCAP,  in the last 168 hours Microbiology: No results found for this basename: cult   No results found for this basename: CULT, SDES,  in the last 168 hours  Imaging: Dg Chest Port 1 View  07/29/2013   CLINICAL DATA:  Shortness of breath.  Tachycardia.  EXAM: PORTABLE CHEST - 1 VIEW  COMPARISON:  None.  FINDINGS: Severe cardiomegaly is present. Cardiac echo should be considered as the possibility of pericardial effusion, cardiomyopathy, and/or valvular disease should be considered in this patient. Mild pulmonary vascular prominence noted. Bilateral interstitial prominence with Kerley B-lines and bilateral alveolar prominence noted consistent with pulmonary edema. Right-sided pleural effusion is most likely present. No pneumothorax. No acute bony abnormality.  IMPRESSION: Findings consistent with congestive heart failure with pulmonary edema and right-sided pleural effusion. Cardiomegaly is severe. Cardiac ECHO may prove useful for further evaluation as the possibility of pericardial effusion, cardiomyopathy, and/or valvular disease should be considered in this patient.   Electronically Signed   By: Maisie Fus  Register   On: 07/29/2013 11:38      ASSESSMENT:  1. Acute systolic HF 2. Severe aortic stenosis 3. Acute respiratory failure 4. Severe COPD with ongoing tobacco use 5. ETOH use - on CIWA protocol 5. Morbid obesity  PLAN/DISCUSSION:  She has severe AS and markedly reduced LV function. Given her tobacco use, I am worried that she also has significant CAD. COPD seems quite severe on exam.  Will need R&L cath on Monday to assess coronaries and RH pressures. Will need AVR +/- CABG however COPD may be limiting factor. Will get PFTs.   Continue diuresis. Add low dose ARB. No b-blocker at this point due to respiratory compromise.    LOS: 1 day    Arvilla Meres, MD 07/30/2013, 11:16 AM

## 2013-07-31 LAB — BASIC METABOLIC PANEL
Calcium: 8.7 mg/dL (ref 8.4–10.5)
Chloride: 94 mEq/L — ABNORMAL LOW (ref 96–112)
GFR calc Af Amer: 78 mL/min — ABNORMAL LOW (ref >90)
GFR calc non Af Amer: 68 mL/min — ABNORMAL LOW (ref >90)
Potassium: 4.7 mEq/L (ref 3.5–5.1)
Sodium: 139 mEq/L (ref 135–145)

## 2013-07-31 LAB — URINALYSIS, ROUTINE W REFLEX MICROSCOPIC
Nitrite: NEGATIVE
Protein, ur: NEGATIVE mg/dL
Specific Gravity, Urine: 1.005 (ref 1.005–1.030)
Urobilinogen, UA: 0.2 mg/dL (ref 0.0–1.0)

## 2013-07-31 MED ORDER — BISOPROLOL FUMARATE 5 MG PO TABS
2.5000 mg | ORAL_TABLET | Freq: Every day | ORAL | Status: DC
  Administered 2013-07-31 – 2013-08-04 (×4): 2.5 mg via ORAL
  Administered 2013-08-05: 10:00:00 via ORAL
  Administered 2013-08-06 – 2013-08-07 (×2): 2.5 mg via ORAL
  Administered 2013-08-08: 10:00:00 via ORAL
  Filled 2013-07-31 (×9): qty 0.5

## 2013-07-31 MED ORDER — GUAIFENESIN-DM 100-10 MG/5ML PO SYRP
5.0000 mL | ORAL_SOLUTION | ORAL | Status: DC | PRN
  Administered 2013-07-31 – 2013-08-04 (×3): 5 mL via ORAL
  Filled 2013-07-31 (×4): qty 5

## 2013-07-31 NOTE — Progress Notes (Addendum)
Subjective:   62 y/o woman with obesity, COPD, ETOH and ongoing tobacco use admitted with acute HF.   Echo 12/5  with biventricular dysfunction EF 25-30% Mild RV HK. Severe AS (Mean gradient 47mm HG)  Diuresed well. Another 2L negative  yesterday. Weight down 3 more pounds. Breathing better. No CP. + L flank pain    Intake/Output Summary (Last 24 hours) at 07/31/13 1059 Last data filed at 07/31/13 0600  Gross per 24 hour  Intake    920 ml  Output   1500 ml  Net   -580 ml    Current meds: . aspirin EC  81 mg Oral Daily  . folic acid  1 mg Oral Daily  . furosemide  40 mg Intravenous BID  . heparin  5,000 Units Subcutaneous Q8H  . losartan  25 mg Oral Daily  . multivitamin with minerals  1 tablet Oral Daily  . potassium chloride  20 mEq Oral BID  . sodium chloride  3 mL Intravenous Q12H  . sodium chloride  3 mL Intravenous Q12H  . thiamine  100 mg Oral Daily   Or  . thiamine  100 mg Intravenous Daily   Infusions:     Objective:  Blood pressure 91/51, pulse 108, temperature 98.1 F (36.7 C), temperature source Oral, resp. rate 18, height 5' (1.524 m), weight 82.6 kg (182 lb 1.6 oz), SpO2 98.00%. Weight change: -2.4 kg (-5 lb 4.7 oz)  Physical Exam: Constitutional: She is oriented to person, place, and time. She appears well-developed and well-nourished. No distress.  Appears older than stated age Neck: JVD 8  Carotid bruit is not present.  Cardiovascular: Normal rate, regular rhythm and intact distal pulses.  Murmur (3/6 best heard at RUSB) heard. S2 markedly decreased Respiratory Markedly decreased breath sounds bilaterally. No wheeze  No CVA tenderness. Extremities:  Tr-1+ edema Neurological: She is alert and oriented to person, place, and time.  Skin: Skin is warm and dry. She is not diaphoretic.  Psychiatric: She has a normal mood and affect. Her behavior is normal.    Telemetry: Sinus tach 90-105 Lab Results: Basic Metabolic Panel:  Recent Labs Lab  07/29/13 1137 07/29/13 1825 07/30/13 0345 07/31/13 0549  NA 135  --  138 139  K 4.3  --  3.9 4.7  CL 97  --  95* 94*  CO2 25  --  33* 38*  GLUCOSE 126*  --  113* 119*  BUN 9  --  10 12  CREATININE 0.71 0.82 0.90 0.90  CALCIUM 9.0  --  8.8 8.7   Liver Function Tests:  Recent Labs Lab 07/30/13 0345  AST 20  ALT 17  ALKPHOS 70  BILITOT 0.7  PROT 6.8  ALBUMIN 3.3*   No results found for this basename: LIPASE, AMYLASE,  in the last 168 hours No results found for this basename: AMMONIA,  in the last 168 hours CBC:  Recent Labs Lab 07/29/13 1137 07/29/13 1825 07/30/13 0345  WBC 12.0* 10.2 9.8  HGB 14.7 14.7 14.4  HCT 43.9 44.5 44.4  MCV 100.0 102.3* 104.2*  PLT 330 337 309   Cardiac Enzymes:  Recent Labs Lab 07/29/13 1820 07/29/13 2240 07/30/13 0345  TROPONINI <0.30 <0.30 <0.30   BNP: No components found with this basename: POCBNP,  CBG: No results found for this basename: GLUCAP,  in the last 168 hours Microbiology: No results found for this basename: cult   No results found for this basename: CULT, SDES,  in the  last 168 hours  Imaging: Dg Chest Port 1 View  07/29/2013   CLINICAL DATA:  Shortness of breath.  Tachycardia.  EXAM: PORTABLE CHEST - 1 VIEW  COMPARISON:  None.  FINDINGS: Severe cardiomegaly is present. Cardiac echo should be considered as the possibility of pericardial effusion, cardiomyopathy, and/or valvular disease should be considered in this patient. Mild pulmonary vascular prominence noted. Bilateral interstitial prominence with Kerley B-lines and bilateral alveolar prominence noted consistent with pulmonary edema. Right-sided pleural effusion is most likely present. No pneumothorax. No acute bony abnormality.  IMPRESSION: Findings consistent with congestive heart failure with pulmonary edema and right-sided pleural effusion. Cardiomegaly is severe. Cardiac ECHO may prove useful for further evaluation as the possibility of pericardial effusion,  cardiomyopathy, and/or valvular disease should be considered in this patient.   Electronically Signed   By: Maisie Fus  Register   On: 07/29/2013 11:38     ASSESSMENT:  1. Acute systolic HF 2. Severe aortic stenosis 3. Acute respiratory failure 4. Severe COPD with ongoing tobacco use 5. ETOH use - on CIWA protocol 5. Morbid obesity  PLAN/DISCUSSION:  She has severe AS and markedly reduced LV function. Given her tobacco use, I am worried that she also has significant underlying CAD in addition to COPD (by exam COPD seems fairly severe)  Volume status improved with diuresis. Renal function stable. Will continue diuresis.   Will need R&L cath on tomorrow to assess coronaries and RH pressures. Will need AVR +/- CABG however COPD may be limiting factor. Will get PFTs.   Will add low dose b-blocker now that respiratory status improved. Make NPO after MN.   She has L flank pain. No CVA tenderness. Will check UA   LOS: 2 days    Arvilla Meres, MD 07/31/2013, 10:59 AM

## 2013-07-31 NOTE — Progress Notes (Signed)
Utilization Review Completed.Cathy Moody T12/02/2013  

## 2013-08-01 ENCOUNTER — Encounter (HOSPITAL_COMMUNITY): Payer: Self-pay | Admitting: Interventional Cardiology

## 2013-08-01 ENCOUNTER — Encounter (HOSPITAL_COMMUNITY)
Admission: EM | Disposition: A | Discharge status: Home / Self Care | Payer: Self-pay | Source: Home / Self Care | Attending: Internal Medicine

## 2013-08-01 ENCOUNTER — Encounter (HOSPITAL_COMMUNITY): Payer: BC Managed Care – PPO

## 2013-08-01 DIAGNOSIS — I359 Nonrheumatic aortic valve disorder, unspecified: Secondary | ICD-10-CM

## 2013-08-01 DIAGNOSIS — N390 Urinary tract infection, site not specified: Secondary | ICD-10-CM

## 2013-08-01 DIAGNOSIS — I5021 Acute systolic (congestive) heart failure: Secondary | ICD-10-CM

## 2013-08-01 DIAGNOSIS — I959 Hypotension, unspecified: Secondary | ICD-10-CM

## 2013-08-01 HISTORY — PX: LEFT AND RIGHT HEART CATHETERIZATION WITH CORONARY ANGIOGRAM: SHX5449

## 2013-08-01 LAB — BASIC METABOLIC PANEL
CO2: 39 mEq/L — ABNORMAL HIGH (ref 19–32)
Calcium: 8.8 mg/dL (ref 8.4–10.5)
Creatinine, Ser: 1.31 mg/dL — ABNORMAL HIGH (ref 0.50–1.10)
GFR calc Af Amer: 50 mL/min — ABNORMAL LOW (ref >90)
GFR calc non Af Amer: 43 mL/min — ABNORMAL LOW (ref >90)
Glucose, Bld: 117 mg/dL — ABNORMAL HIGH (ref 70–99)
Potassium: 4.2 mEq/L (ref 3.5–5.1)
Sodium: 137 mEq/L (ref 135–145)

## 2013-08-01 LAB — POCT I-STAT 3, VENOUS BLOOD GAS (G3P V)
Acid-Base Excess: 9 mmol/L — ABNORMAL HIGH (ref 0.0–2.0)
Bicarbonate: 38.3 mEq/L — ABNORMAL HIGH (ref 20.0–24.0)
O2 Saturation: 54 %
pH, Ven: 7.344 — ABNORMAL HIGH (ref 7.250–7.300)
pO2, Ven: 32 mmHg (ref 30.0–45.0)

## 2013-08-01 LAB — CBC
Hemoglobin: 13.5 g/dL (ref 12.0–15.0)
MCH: 33 pg (ref 26.0–34.0)
MCHC: 31.5 g/dL (ref 30.0–36.0)
Platelets: 313 10*3/uL (ref 150–400)
RBC: 4.09 MIL/uL (ref 3.87–5.11)
RDW: 13.7 % (ref 11.5–15.5)
WBC: 8.9 10*3/uL (ref 4.0–10.5)

## 2013-08-01 LAB — CREATININE, SERUM
Creatinine, Ser: 0.99 mg/dL (ref 0.50–1.10)
GFR calc Af Amer: 70 mL/min — ABNORMAL LOW (ref >90)
GFR calc non Af Amer: 60 mL/min — ABNORMAL LOW (ref >90)

## 2013-08-01 LAB — PROTIME-INR: INR: 1.08 (ref 0.00–1.49)

## 2013-08-01 SURGERY — LEFT AND RIGHT HEART CATHETERIZATION WITH CORONARY ANGIOGRAM
Anesthesia: LOCAL

## 2013-08-01 MED ORDER — SODIUM CHLORIDE 0.9 % IJ SOLN: 3.0000 mL | INTRAMUSCULAR | Status: DC | PRN

## 2013-08-01 MED ORDER — HEPARIN SODIUM (PORCINE) 5000 UNIT/ML IJ SOLN
5000.0000 [IU] | Freq: Three times a day (TID) | INTRAMUSCULAR | Status: DC
  Administered 2013-08-01 – 2013-08-07 (×17): 5000 [IU] via SUBCUTANEOUS
  Filled 2013-08-01 (×20): qty 1

## 2013-08-01 MED ORDER — LIDOCAINE HCL (PF) 1 % IJ SOLN
INTRAMUSCULAR | Status: AC
Start: 2013-08-01 — End: 2013-08-01
  Filled 2013-08-01: qty 30

## 2013-08-01 MED ORDER — SODIUM CHLORIDE 0.9 % IV SOLN
INTRAVENOUS | Status: DC
  Administered 2013-08-01: 13:00:00 via INTRAVENOUS

## 2013-08-01 MED ORDER — SODIUM CHLORIDE 0.9 % IV SOLN: 250.0000 mL | INTRAVENOUS | Status: DC | PRN

## 2013-08-01 MED ORDER — SODIUM CHLORIDE 0.9 % IV SOLN: INTRAVENOUS | Status: AC

## 2013-08-01 MED ORDER — ACETAMINOPHEN 325 MG PO TABS: 650.0000 mg | ORAL_TABLET | ORAL | Status: DC | PRN

## 2013-08-01 MED ORDER — ASPIRIN 81 MG PO CHEW: 81.0000 mg | CHEWABLE_TABLET | ORAL | Status: DC

## 2013-08-01 MED ORDER — LEVOFLOXACIN IN D5W 500 MG/100ML IV SOLN
500.0000 mg | INTRAVENOUS | Status: DC
  Administered 2013-08-01 – 2013-08-03 (×3): 500 mg via INTRAVENOUS
  Filled 2013-08-01 (×4): qty 100

## 2013-08-01 MED ORDER — SODIUM CHLORIDE 0.9 % IJ SOLN: 3.0000 mL | Freq: Two times a day (BID) | INTRAMUSCULAR | Status: DC

## 2013-08-01 MED ORDER — HEPARIN (PORCINE) IN NACL 2-0.9 UNIT/ML-% IJ SOLN
INTRAMUSCULAR | Status: AC
  Filled 2013-08-01: qty 1000

## 2013-08-01 NOTE — CV Procedure (Addendum)
     Left and Right Heart Catheterization with Coronary Angiography Report  Cathy Moody  62 y.o.  female [birth year]  Procedure Date: 08/01/2013  Referring Physician: J.Varanasi, MD Primary Cardiologist:: Catalina Gravel  INDICATIONS: Critical aortic stenosis (by echo) with severe left ventricular systolic dysfunction(LVEF less than 30% by echo) the  PROCEDURE: 1. Left heart catheterization; 2. Coronary angiography; 3. Right heart catheterization  CONSENT:  The risks, benefits, and details of the procedure were explained in detail to the patient. Risks including death, stroke, heart attack, kidney injury, allergy, limb ischemia, bleeding and radiation injury were discussed.  The patient verbalized understanding and wanted to proceed.  Informed written consent was obtained.  PROCEDURE TECHNIQUE:  After Xylocaine anesthesia a 5 French sheath was placed in the right femoral artery with after multiple sticks.  The right femoral vein was entered after multiple passes and a 7 French sheath inserted using the modified Seldinger technique. We did not attempt to cross the aortic valve. Coronary angiography was done using a 5 [place] JL 4 and JR 4 catheters.  Left ventriculography was not done.   No sedation was administered because of significant lung disease and tendency towards hypoxia    CONTRAST:  Total of 60 cc.  COMPLICATIONS:  None   HEMODYNAMICS:  Aortic pressure 99/58 mmHg; LV pressure (we did not attempt to cross the aortic valve); LVEDP not obtained, RA 14 mm mercury, RV 65/14 mmHg, PA 65/31 mm mercury, PCWP(mean) mean of 31 mm mercury, Cardiac Output 3.07 L per minute by Fick determination.  ANGIOGRAPHIC DATA:   The left main coronary artery is normal.  The left anterior descending artery is widely patent giving origin to a large diagonal which is also widely patent..  The left circumflex artery is 2 obtuse marginal branches with the first a trifurcating vessel. Widely  patent..  The right coronary artery is dominant and widely patent.Marland Kitchen   LEFT VENTRICULOGRAM:  Left ventricular angiogram was done in the 30 RAO projection and revealed not performed as the aortic valve was not retrogradely crossed.   IMPRESSIONS:  1. Widely patent coronary arteries with no evidence of significant CAD  2. Moderate to severe pulmonary hypertension  3. Known severe left ventricular systolic dysfunction, EF less than 30% by echo  4. Critical aortic stenosis based upon echo findings   RECOMMENDATION:  Referral to heart valve team. Phoned in to [facility].

## 2013-08-01 NOTE — Care Management Note (Addendum)
    Page 1 of 1   08/02/2013     12:04:24 PM   CARE MANAGEMENT NOTE 08/02/2013  Patient:  Cathy Moody, Cathy Moody   Account Number:  [id number]  Date Initiated:  08/01/2013  Documentation initiated by:  Junius Creamer  Subjective/Objective Assessment:   adm w chf     Action/Plan:   lives alone, pcp dr Onalee Hua tysinger  supportive mother and family.   Anticipated DC Date:     Anticipated DC Plan:  HOME W HOME HEALTH SERVICES      DC Planning Services  CM consult      Choice offered to / List presented to:             Status of service:   Medicare Important Message given?   (If response is "NO", the following Medicare IM given date fields will be blank) Date Medicare IM given:   Date Additional Medicare IM given:    Discharge Disposition:    Per UR Regulation:  Reviewed for med. necessity/level of care/duration of stay  If discussed at Long Length of Stay Meetings, dates discussed:    Comments:  12/9  1202pm debbie Arnetra Terris rn,bsn spoke w pt and her mother. pt lives alone but her mother assists if needed.not sure of dc needs at present. will follow and assist.  12/8 1021a debbie Timmey Lamba rn,bsn may need hhc for chf follow up. will assist as needed.

## 2013-08-01 NOTE — Progress Notes (Signed)
Pt. Refuses CPAP at this time. Pt. States that she doesn't wear CPAP at home. Pt. Was made aware to let RT know if she decided to wear CPAP anytime during the night.

## 2013-08-01 NOTE — Progress Notes (Addendum)
Subjective:   62 y/o woman with obesity, COPD, ETOH and ongoing tobacco use admitted with acute HF.   Echo 12/5  with biventricular dysfunction EF 25-30% Mild RV HK. Severe AS (Mean gradient 47mm HG)  Diuresed well. Another 1L negative  yesterday. Weight up...unclear if that is accurate. Breathing better. No CP.  She had an area on the left calf leaking some fluid.    Intake/Output Summary (Last 24 hours) at 08/01/13 0906 Last data filed at 07/31/13 2200  Gross per 24 hour  Intake    740 ml  Output   1200 ml  Net   -460 ml    Current meds: . aspirin EC  81 mg Oral Daily  . bisoprolol  2.5 mg Oral Daily  . folic acid  1 mg Oral Daily  . heparin  5,000 Units Subcutaneous Q8H  . levofloxacin (LEVAQUIN) IV  500 mg Intravenous Q24H  . losartan  25 mg Oral Daily  . multivitamin with minerals  1 tablet Oral Daily  . sodium chloride  3 mL Intravenous Q12H  . sodium chloride  3 mL Intravenous Q12H  . thiamine  100 mg Oral Daily   Or  . thiamine  100 mg Intravenous Daily   Infusions:     Objective:  Blood pressure 87/73, pulse 71, temperature 98.1 F (36.7 C), temperature source Oral, resp. rate 20, height 5' (1.524 m), weight 186 lb 1.1 oz (84.4 kg), SpO2 99.00%. Weight change: 3 lb 15.5 oz (1.8 kg)  Physical Exam: Constitutional: She is oriented to person, place, and time. She appears well-developed and well-nourished. No distress.  Appears older than stated age Neck:  Carotid bruit is not present.  Cardiovascular: Normal rate, regular rhythm and intact distal pulses.  Murmur (3/6 best heard at RUSB) heard. S2 markedly decreased Respiratory Markedly decreased breath sounds bilaterally.   No CVA tenderness. Extremities:  Tr-1+ edema Neurological: She is alert and oriented to person, place, and time.  Skin: Skin is warm and dry. She is not diaphoretic.  Psychiatric: She has a normal mood and affect. Her behavior is normal.    Telemetry: NSR Lab Results: Basic  Metabolic Panel:  Recent Labs Lab 07/29/13 1137 07/29/13 1825 07/30/13 0345 07/31/13 0549 08/01/13 0448  NA 135  --  138 139 137  K 4.3  --  3.9 4.7 4.2  CL 97  --  95* 94* 93*  CO2 25  --  33* 38* 39*  GLUCOSE 126*  --  113* 119* 117*  BUN 9  --  10 12 19   CREATININE 0.71 0.82 0.90 0.90 1.31*  CALCIUM 9.0  --  8.8 8.7 8.8   Liver Function Tests:  Recent Labs Lab 07/30/13 0345  AST 20  ALT 17  ALKPHOS 70  BILITOT 0.7  PROT 6.8  ALBUMIN 3.3*   No results found for this basename: LIPASE, AMYLASE,  in the last 168 hours No results found for this basename: AMMONIA,  in the last 168 hours CBC:  Recent Labs Lab 07/29/13 1137 07/29/13 1825 07/30/13 0345  WBC 12.0* 10.2 9.8  HGB 14.7 14.7 14.4  HCT 43.9 44.5 44.4  MCV 100.0 102.3* 104.2*  PLT 330 337 309   Cardiac Enzymes:  Recent Labs Lab 07/29/13 1820 07/29/13 2240 07/30/13 0345  TROPONINI <0.30 <0.30 <0.30   BNP: No components found with this basename: POCBNP,  CBG: No results found for this basename: GLUCAP,  in the last 168 hours Microbiology: No results found for  this basename: cult   No results found for this basename: CULT, SDES,  in the last 168 hours  Imaging: No results found.   ASSESSMENT:  1. Acute systolic HF 2. Severe aortic stenosis 3. Acute respiratory failure 4. Severe COPD with ongoing tobacco use 5. ETOH use - on CIWA protocol 5. Morbid obesity  PLAN/DISCUSSION:  She has severe AS and markedly reduced LV function. Given her tobacco use, I am worried that she also has significant underlying CAD in addition to COPD (by exam COPD seems fairly severe)  Volume status improved with diuresis. Cr increased slightly today.  Given cath, will slow  diuresis. Now able to lie flat.  Will need R&L cath  to assess coronaries and RH pressures. Will need AVR +/- CABG however COPD may be limiting factor. Will get PFTs.   Will add low dose b-blocker now that respiratory status improved. NPO.   Cath explained to patient and family.  All questions answered.  Dr. Katrinka Blazing to perform cath later today.  She had L flank pain. U/A with negative nitrites, + LE.  Watch for dysuria.  Hold losartan today given cath and the fact that she has had some low BP.  May be related to aggressive diuresis. Cautious of hypotension given severe AS.   LOS: 3 days    Corky Crafts., MD 08/01/2013, 9:06 AM

## 2013-08-01 NOTE — Progress Notes (Addendum)
[facility] PROGRESS NOTE  Cathy Moody ZOX:[id number] DOB: April 05, 1951 DOA: 07/29/2013 PCP: Ernst Breach, PA-C  Assessment/Plan: 62 y.o. female with PMH of asthma, alcoholism, tobacco use presented with SOB, progressive DOE, PND, orthopnea, associated with LE edema found in acute CHF   1. Acute decompensated CHF; echo: LVEF 25%; diffuse hypokinesia; AVA 0.6, RVSP 64 -improved on diuresis; I/O neg 3.0; added ABR, BB per cards; LHC, AVR eval per cardiology; appreciate the input   -hold diuretics 12/8, clinically looks better, mild worsening Cr and scheduled for LHC, BP low; close monitor   2. Asthma/COPD; cont prn bronchodilators; pend PFTs  3. Substance abuse, tobacco, etoh;  -monitor on ciwa protocol; stop smoking   4. Mild elevated TSH likely due to acute illness; free t4-normal;   5.  Probable UTI, pend urine cultures; start PO atx     Code Status: full Family Communication: boyfriend at the bedside; d/w her son mother  (indicate person spoken with, relationship, and if by phone, the number) Disposition Plan: pend improvement    Consultants:  cardiology   Procedures:  Echo: LVEF 25%; AVA 0.6  Antibiotics:  None  (indicate start date, and stop date if known)  HPI/Subjective: alert  Objective: Filed Vitals:   08/01/13 0806  BP:   Pulse:   Temp: 98.1 F (36.7 C)  Resp: 20    Intake/Output Summary (Last 24 hours) at 08/01/13 0900 Last data filed at 07/31/13 2200  Gross per 24 hour  Intake    740 ml  Output   1200 ml  Net   -460 ml   Filed Weights   07/30/13 0401 07/31/13 0438 08/01/13 0422  Weight: 84 kg (185 lb 3 oz) 82.6 kg (182 lb 1.6 oz) 84.4 kg (186 lb 1.1 oz)    Exam:   General:  alert  Cardiovascular: s1,s2 rrr, systolic, +diastolic mr   Respiratory: BL LL crackles   Abdomen: soft, nt, nd   Musculoskeletal: LE edema   Data Reviewed: Basic Metabolic Panel:  Recent Labs Lab 07/29/13 1137 07/29/13 1825 07/30/13 0345  07/31/13 0549 08/01/13 0448  NA 135  --  138 139 137  K 4.3  --  3.9 4.7 4.2  CL 97  --  95* 94* 93*  CO2 25  --  33* 38* 39*  GLUCOSE 126*  --  113* 119* 117*  BUN 9  --  10 12 19   CREATININE 0.71 0.82 0.90 0.90 1.31*  CALCIUM 9.0  --  8.8 8.7 8.8   Liver Function Tests:  Recent Labs Lab 07/30/13 0345  AST 20  ALT 17  ALKPHOS 70  BILITOT 0.7  PROT 6.8  ALBUMIN 3.3*   No results found for this basename: LIPASE, AMYLASE,  in the last 168 hours No results found for this basename: AMMONIA,  in the last 168 hours CBC:  Recent Labs Lab 07/29/13 1137 07/29/13 1825 07/30/13 0345  WBC 12.0* 10.2 9.8  HGB 14.7 14.7 14.4  HCT 43.9 44.5 44.4  MCV 100.0 102.3* 104.2*  PLT 330 337 309   Cardiac Enzymes:  Recent Labs Lab 07/29/13 1820 07/29/13 2240 07/30/13 0345  TROPONINI <0.30 <0.30 <0.30   BNP (last 3 results)  Recent Labs  07/29/13 1138  PROBNP 26344.0*   CBG: No results found for this basename: GLUCAP,  in the last 168 hours  Recent Results (from the past 240 hour(s))  MRSA PCR SCREENING     Status: None   Collection Time    07/29/13  3:59 PM      Result Value Range Status   MRSA by PCR NEGATIVE  NEGATIVE Final   Comment:            The GeneXpert MRSA Assay (FDA     approved for NASAL specimens     only), is one component of a     comprehensive MRSA colonization     surveillance program. It is not     intended to diagnose MRSA     infection nor to guide or     monitor treatment for     MRSA infections.     Studies: No results found.  Scheduled Meds: . aspirin EC  81 mg Oral Daily  . bisoprolol  2.5 mg Oral Daily  . folic acid  1 mg Oral Daily  . furosemide  40 mg Intravenous BID  . heparin  5,000 Units Subcutaneous Q8H  . losartan  25 mg Oral Daily  . multivitamin with minerals  1 tablet Oral Daily  . sodium chloride  3 mL Intravenous Q12H  . sodium chloride  3 mL Intravenous Q12H  . thiamine  100 mg Oral Daily   Or  . thiamine  100 mg  Intravenous Daily   Continuous Infusions:   Principal Problem:   Acute CHF Active Problems:   Asthma   Alcohol abuse   CHF (congestive heart failure)   Tobacco abuse   Acute systolic heart failure   Aortic stenosis, severe    Time spent: >35 minutes     Esperanza Sheets  [facility] Pager [phone]. If 7PM-7AM, please contact night-coverage at [email or web address], password [facility] 08/01/2013, 9:00 AM  LOS: 3 days

## 2013-08-01 NOTE — Progress Notes (Signed)
Critically ill patient with severe AS, LV systolic failure and COPD. She has slight bump in creatinine c/w yesterday. The procedure and risk including stroke, death, MI, respiratory failure, bleeding, limb ischemia among others was discussed in detail with the patient, son, and mother. She has a higher than typical risk considering respiratory issues and poor LV function.

## 2013-08-02 ENCOUNTER — Encounter: Payer: Self-pay | Admitting: Internal Medicine

## 2013-08-02 ENCOUNTER — Other Ambulatory Visit: Payer: Self-pay | Admitting: *Deleted

## 2013-08-02 ENCOUNTER — Inpatient Hospital Stay (HOSPITAL_COMMUNITY): Payer: BC Managed Care – PPO

## 2013-08-02 DIAGNOSIS — I359 Nonrheumatic aortic valve disorder, unspecified: Secondary | ICD-10-CM

## 2013-08-02 LAB — POCT I-STAT 3, ART BLOOD GAS (G3+)
Bicarbonate: 36.6 mEq/L — ABNORMAL HIGH (ref 20.0–24.0)
O2 Saturation: 94 %
TCO2: 39 mmol/L (ref 0–100)
pCO2 arterial: 64 mmHg (ref 35.0–45.0)
pH, Arterial: 7.365 (ref 7.350–7.450)
pO2, Arterial: 75 mmHg — ABNORMAL LOW (ref 80.0–100.0)

## 2013-08-02 LAB — PULMONARY FUNCTION TEST

## 2013-08-02 MED ORDER — MOMETASONE FURO-FORMOTEROL FUM 100-5 MCG/ACT IN AERO
2.0000 | INHALATION_SPRAY | Freq: Two times a day (BID) | RESPIRATORY_TRACT | Status: DC
  Administered 2013-08-02 – 2013-08-09 (×13): 2 via RESPIRATORY_TRACT
  Filled 2013-08-02: qty 8.8

## 2013-08-02 MED ORDER — FUROSEMIDE 10 MG/ML IJ SOLN
40.0000 mg | Freq: Two times a day (BID) | INTRAMUSCULAR | Status: DC
  Administered 2013-08-02 – 2013-08-04 (×6): 40 mg via INTRAVENOUS
  Filled 2013-08-02 (×8): qty 4

## 2013-08-02 MED ORDER — TIOTROPIUM BROMIDE MONOHYDRATE 18 MCG IN CAPS
18.0000 ug | ORAL_CAPSULE | Freq: Every day | RESPIRATORY_TRACT | Status: DC
  Administered 2013-08-02 – 2013-08-09 (×7): 18 ug via RESPIRATORY_TRACT
  Filled 2013-08-02 (×2): qty 5

## 2013-08-02 NOTE — Progress Notes (Signed)
Dr. York Spaniel notifed of 10 beat run of V-tach at 1222 this afternoon. No new orders received. Dr. York Spaniel also notified of two open wounds to each elbow (pt states she has plaque psoriasis). Order received to place wound care consult.  Will continue to monitor.   Asher Muir Lilliauna Van,RN

## 2013-08-02 NOTE — Consult Note (Signed)
WOC wound consult note Reason for Consult: Consult requested for bilat elbows.  Pt states she has psoriasis and has a chronic problem with her elbows healing, then reopening as open wounds prior to admission.  She does not want to go to a dermatologist and denies using any special topical treatment. Wound type: Left elbow full thickness wound .2X.2X.2cm, dry scabbed area surrounding.  Wound bed dark red and dry.  No odor or drainage. Right elbow with full thickness wound .5X.5X.2cm, dry dark red wound bed, no odor or drainage.  Pressure Ulcer POA: These are NOT pressure ulcers. Dressing procedure/placement/frequency: Foam dressing to protect and promote healing. Encouraged pt to obtain follow-up with dermatologist after discharge for this chronic problem. Please re-consult if further assistance is needed.  Thank-you,  Cammie Mcgee MSN, RN, CWOCN, [place], CNS [phone]

## 2013-08-02 NOTE — Progress Notes (Signed)
Pt. Continues to refuse CPAP. Pt. Was made aware to call RT if she changed her mind & decided to wear CPAP.

## 2013-08-02 NOTE — Progress Notes (Addendum)
[facility] PROGRESS NOTE  Cathy Moody ZOX:[id number] DOB: 22-May-1951 DOA: 07/29/2013 PCP: Ernst Breach, PA-C  Assessment/Plan: 62 y.o. female with PMH of asthma, alcoholism, tobacco use presented with SOB, progressive DOE, PND, orthopnea, associated with LE edema found in acute CHF   1. Acute decompensated CHF; echo: LVEF 25%; diffuse hypokinesia; AVA 0.6, RVSP 64 -cont diuresis; I/O neg 4.0; added BB per cards;holding ARB; LHC: no evidence of CAD;   -awaiting CT surgery opinion regarding AVR      2. Asthma/COPD; likely chronic CO2 retainer; cont prn bronchodilators, added spiriva, advair; pend PFTs  3. Substance abuse, tobacco, etoh;  -monitor on ciwa protocol; stop smoking   4. Mild elevated TSH likely due to acute illness; free t4-normal;   5. Probable UTI, pend urine cultures; started PO atx     Code Status: full Family Communication: boyfriend at the bedside; d/w her son mother  (indicate person spoken with, relationship, and if by phone, the number) Disposition Plan: pend improvement    Consultants:  cardiology   Procedures:  Echo: LVEF 25%; AVA 0.6  Antibiotics:  None  (indicate start date, and stop date if known)  HPI/Subjective: alert  Objective: Filed Vitals:   08/02/13 0800  BP: 105/63  Pulse: 94  Temp: 97.8 F (36.6 C)  Resp: 16    Intake/Output Summary (Last 24 hours) at 08/02/13 1005 Last data filed at 08/01/13 1900  Gross per 24 hour  Intake      0 ml  Output    351 ml  Net   -351 ml   Filed Weights   07/31/13 0438 08/01/13 0422 08/02/13 0357  Weight: 82.6 kg (182 lb 1.6 oz) 84.4 kg (186 lb 1.1 oz) 82.6 kg (182 lb 1.6 oz)    Exam:   General:  alert  Cardiovascular: s1,s2 rrr, systolic, +diastolic mr   Respiratory: BL LL crackles   Abdomen: soft, nt, nd   Musculoskeletal: LE edema   Data Reviewed: Basic Metabolic Panel:  Recent Labs Lab 07/29/13 1137 07/29/13 1825 07/30/13 0345 07/31/13 0549  08/01/13 0448 08/01/13 1701  NA 135  --  138 139 137  --   K 4.3  --  3.9 4.7 4.2  --   CL 97  --  95* 94* 93*  --   CO2 25  --  33* 38* 39*  --   GLUCOSE 126*  --  113* 119* 117*  --   BUN 9  --  10 12 19   --   CREATININE 0.71 0.82 0.90 0.90 1.31* 0.99  CALCIUM 9.0  --  8.8 8.7 8.8  --    Liver Function Tests:  Recent Labs Lab 07/30/13 0345  AST 20  ALT 17  ALKPHOS 70  BILITOT 0.7  PROT 6.8  ALBUMIN 3.3*   No results found for this basename: LIPASE, AMYLASE,  in the last 168 hours No results found for this basename: AMMONIA,  in the last 168 hours CBC:  Recent Labs Lab 07/29/13 1137 07/29/13 1825 07/30/13 0345 08/01/13 1701  WBC 12.0* 10.2 9.8 8.9  HGB 14.7 14.7 14.4 13.5  HCT 43.9 44.5 44.4 42.9  MCV 100.0 102.3* 104.2* 104.9*  PLT 330 337 309 313   Cardiac Enzymes:  Recent Labs Lab 07/29/13 1820 07/29/13 2240 07/30/13 0345  TROPONINI <0.30 <0.30 <0.30   BNP (last 3 results)  Recent Labs  07/29/13 1138  PROBNP 26344.0*   CBG: No results found for this basename: GLUCAP,  in the last  168 hours  Recent Results (from the past 240 hour(s))  MRSA PCR SCREENING     Status: None   Collection Time    07/29/13  3:59 PM      Result Value Range Status   MRSA by PCR NEGATIVE  NEGATIVE Final   Comment:            The GeneXpert MRSA Assay (FDA     approved for NASAL specimens     only), is one component of a     comprehensive MRSA colonization     surveillance program. It is not     intended to diagnose MRSA     infection nor to guide or     monitor treatment for     MRSA infections.     Studies: No results found.  Scheduled Meds: . bisoprolol  2.5 mg Oral Daily  . folic acid  1 mg Oral Daily  . furosemide  40 mg Intravenous BID  . heparin  5,000 Units Subcutaneous Q8H  . levofloxacin (LEVAQUIN) IV  500 mg Intravenous Q24H  . multivitamin with minerals  1 tablet Oral Daily   Continuous Infusions:   Principal Problem:   Acute CHF Active  Problems:   Asthma   Alcohol abuse   CHF (congestive heart failure)   Tobacco abuse   Acute systolic heart failure   Aortic stenosis, severe    Time spent: >35 minutes     Esperanza Sheets  [facility] Pager [phone]. If 7PM-7AM, please contact night-coverage at [email or web address], password [facility] 08/02/2013, 10:05 AM  LOS: 4 days

## 2013-08-02 NOTE — Consult Note (Addendum)
[facility]  MULTIDISCIPLINARY HEART VALVE CLINIC    CARDIOTHORACIC SURGERY CONSULTATION REPORT  Referring Provider is Everette Rank, MD PCP is Ernst Breach, PA-C  Chief Complaint  Patient presents with  . Tachycardia  . Shortness of Breath    HPI:  The patient is a 62 year old white female with a long history of heavy smoking, chronic shortness of breath for years probably related to COPD, who also has a long history of a heart murmur. She reports at least a 6 week history of worsening shortness of breath with exertion that has progressed to the point that she is getting short of breath going to the bathroom. She has also developed marked swelling in her legs. On admission her BNP was 16109. Initial Troponin I poc was .07 and subsequent follow-up levels were < 0.3. A 2 D echo showed severe calcific AS with an AVA of 0.61 cm2 by VTI and 0.49 cm2 by Vmax. The mean gradient was 47 mm Hg with a peak gradient of 80 mm Hg. Her EF is severely reduced at 25% with diffuse hypokinesis and mild to moderate MR. Systolic PAP was severely elevated at 64. The LVOT diameter was only 19 mm. She underwent a R&L heart cath showing no coronary disease, severe pulmonary HTN with PA 65/31 with a PCWP mean of 31, RA of 14, and CO of 3.07 L/min with a BSA of 1.8.  Past Medical History  Diagnosis Date  . Asthma/COPD   . Heart murmur   . Acute CHF     Cathy Moody 07/29/2013  . Alcohol abuse     /notes 07/29/2013  . Hypotension     History reviewed. No pertinent past surgical history except C-section.  Family History  Problem Relation Age of Onset  . Diabetes Paternal Grandmother     History   Social History  . Marital Status: Divorced    Spouse Name: N/A    Number of Children: N/A  . Years of Education: N/A   Occupational History  . Stays at home and cares for grandchildren   Social History Main Topics  . Smoking status: Smoked 1-2 ppd for many years. Recently smoking 6-10  cig per day and using vapor.    Types: Cigarettes  . Smokeless tobacco: Not on file  . Alcohol Use: Yes     Comment: Pt is weekend drinker  . Drug Use: No  . Sexual Activity: Not on file   Other Topics Concern  . Not on file   Social History Narrative  . No narrative on file    Current Facility-Administered Medications  Medication Dose Route Frequency Provider Last Rate Last Dose  . acetaminophen (TYLENOL) tablet 650 mg  650 mg Oral Q4H PRN Robbie Lis, PA-C   650 mg at 07/31/13 2156  . bisoprolol (ZEBETA) tablet 2.5 mg  2.5 mg Oral Daily Dolores Patty, MD   2.5 mg at 08/02/13 1014  . folic acid (FOLVITE) tablet 1 mg  1 mg Oral Daily Esperanza Sheets, MD   1 mg at 08/02/13 1014  . furosemide (LASIX) injection 40 mg  40 mg Intravenous BID Everette Rank, MD   40 mg at 08/02/13 1013  . guaiFENesin-dextromethorphan (ROBITUSSIN DM) 100-10 MG/5ML syrup 5 mL  5 mL Oral Q4H PRN Dolores Patty, MD   5 mL at 07/31/13 2153  . heparin injection 5,000 Units  5,000 Units Subcutaneous Q8H Lesleigh Noe, MD   5,000 Units at 08/02/13  0602  . levalbuterol (XOPENEX) nebulizer solution 0.63 mg  0.63 mg Nebulization Q6H PRN Everette Rank, MD   0.63 mg at 08/01/13 1642  . levofloxacin (LEVAQUIN) IVPB 500 mg  500 mg Intravenous Q24H Esperanza Sheets, MD   500 mg at 08/02/13 1013  . mometasone-formoterol (DULERA) 100-5 MCG/ACT inhaler 2 puff  2 puff Inhalation BID Esperanza Sheets, MD   2 puff at 08/02/13 1201  . morphine 2 MG/ML injection 2 mg  2 mg Intravenous Q4H PRN Esperanza Sheets, MD      . multivitamin with minerals tablet 1 tablet  1 tablet Oral Daily Esperanza Sheets, MD   1 tablet at 08/02/13 1014  . ondansetron (ZOFRAN) injection 4 mg  4 mg Intravenous Q6H PRN Brittainy Simmons, PA-C      . tiotropium (SPIRIVA) inhalation capsule 18 mcg  18 mcg Inhalation Daily Esperanza Sheets, MD   18 mcg at 08/02/13 1201  . zolpidem (AMBIEN) tablet 5 mg  5 mg Oral QHS PRN Everette Rank, MD   5 mg  at 08/01/13 2141    No Known Allergies    Review of Systems:   General:  normal appetite, decreased energy, positive weight gain, no weight loss, no fever  Cardiac:  no chest pain with exertion, no chest pain at rest, marked SOB with mild exertion, positive resting SOB, positive PND, positive orthopnea, no  palpitations, no arrhythmia, no atrial fibrillation, marked LE edema, some dizzy spells with standing, no syncope  Respiratory:  marked shortness of breath, no home oxygen, non productive cough, positive dry cough,  no hemoptysis, history of asthma, no pain with inspiration or cough, no sleep apnea, no CPAP at night  GI:   no difficulty swallowing, no reflux, no frequent heartburn, no hiatal hernia, no abdominal pain, no constipation, no diarrhea, no hematochezia, no hematemesis, no melena  GU:   no dysuria,  no frequency, no urinary tract infection, no hematuria,  no kidney stones, no kidney disease  Vascular:  no pain suggestive of claudication, no pain in feet, no leg cramps, no varicose veins, no DVT, no non-healing foot ulcer  Neuro:   no stroke, no TIA's, no seizures, no headaches, notemporary blindness one eye,  no slurred speech, no peripheral neuropathy, no chronic pain, no instability of gait, no memory/cognitive dysfunction  Musculoskeletal: no arthritis, no joint swelling, no myalgias, no difficulty walking, no mobility, positive for back pain  Skin:   no rash, no itching, no skin infections, no pressure sores or ulcerations  Psych:   no anxiety, no depression, no nervousness, no unusual recent stress  Eyes:   no blurry vision, no floaters, no recent vision changes, no wears glasses or contacts  ENT:   no hearing loss, no loose or painful teeth  Hematologic:  no easy bruising, no abnormal bleeding, no clotting disorder, no frequent epistaxis  Endocrine:  no diabetes, does not check CBG's at home.           Physical Exam:   BP 105/63  Pulse 94  Temp(Src) 97.2 F (36.2  C) (Oral)  Resp 16  Ht 5' (1.524 m)  Wt 82.6 kg (182 lb 1.6 oz)  BMI 35.56 kg/m2  SpO2 96% BSA 1.8  General:  Chronically ill-appearing, sitting upright in bed, morbidly obese.   HEENT:  Unremarkable   Neck:   no JVD,  no adenopathy , transmitted murmur to both sides of neck  Chest:   distant, symmetrical breath sounds, no wheezes,  no rhonchi   CV:   RRR, grade III/VI crescendo/decrescendo murmur heard best at RSB, II/VI  diastolic murmur RSB to apex.  Abdomen:  soft, non-tender, no masses , obese  Extremities:  warm, well-perfused, pulses not palpable in feet, mild ankle edema bilat  Rectal/GU  Deferred  Neuro:   Grossly non-focal and symmetrical throughout  Skin:   Clean and dry, no rashes, no breakdown   Diagnostic Tests:  ABG at cath was 7.36, PCO2 of 64, PO2 of 75, 94% sat  Creat 0.99 Albumin 3.3 Hemoglobin A1C 6.3   *[place]* *[facility]* [address] [place], [place] [address] [phone]  ------------------------------------------------------------ Transthoracic Echocardiography  Patient: Masa, Lubin MR #: [id number] Study Date: 07/29/2013 Gender: F Age: 49 Height: Weight: BSA: Pt. Status: Room: [facility]  PERFORMING [facility], Echo ORDERING Everette Rank, MD SONOGRAPHER Georgian Co, RDCS, CCT ADMITTING [place] Spaniel, [place] cc:  ------------------------------------------------------------ LV EF: 25% - 30%  ------------------------------------------------------------ Indications: CHF - 428.0.  ------------------------------------------------------------ History: PMH: Dyspnea.  ------------------------------------------------------------ Study Conclusions  - Left ventricle: The cavity size was normal. Systolic function was severely reduced. The estimated ejection fraction was in the range of 25% to 30%. Diffuse hypokinesis. - Aortic valve: Transvalvular velocity was increased. There was severe stenosis. Mild  regurgitation. Valve area: 0.61cm^2(VTI). Valve area: 0.49cm^2 (Vmax). - Mitral valve: Calcified annulus. Mild to moderate regurgitation. - Left atrium: The atrium was mildly to moderately dilated. - Right ventricle: The cavity size was mildly dilated. Wall thickness was normal. Systolic function was mildly reduced. - Right atrium: The atrium was mildly dilated. - Pulmonary arteries: Systolic pressure was moderately to severely increased. PA peak pressure: 64mm Hg (S). Transthoracic echocardiography. M-mode, complete 2D, spectral Doppler, and color Doppler. Patient status: Inpatient. Location: ICU/CCU  ------------------------------------------------------------  ------------------------------------------------------------ Left ventricle: The cavity size was normal. Systolic function was severely reduced. The estimated ejection fraction was in the range of 25% to 30%. Diffuse hypokinesis.  ------------------------------------------------------------ Aortic valve: Trileaflet; severely calcified leaflets. Mobility was not restricted. Doppler: Transvalvular velocity was increased. There was severe stenosis. Mild regurgitation. Valve area: 0.61cm^2(VTI). Peak velocity ratio of LVOT to aortic valve: 0.17. Valve area: 0.49cm^2 (Vmax). Mean gradient: 47mm Hg (S). Peak gradient: 80mm Hg (S).  ------------------------------------------------------------ Aorta: Aortic root: The aortic root was normal in size.  ------------------------------------------------------------ Mitral valve: Calcified annulus. Mobility was not restricted. Doppler: Transvalvular velocity was within the normal range. There was no evidence for stenosis. Mild to moderate regurgitation. Peak gradient: 5mm Hg (D).  ------------------------------------------------------------ Left atrium: The atrium was mildly to moderately dilated.  ------------------------------------------------------------ Atrial septum:  Poorly visualized.  ------------------------------------------------------------ Right ventricle: The cavity size was mildly dilated. Wall thickness was normal. Systolic function was mildly reduced.  ------------------------------------------------------------ Pulmonic valve: Poorly visualized. Doppler: Transvalvular velocity was within the normal range. There was no evidence for stenosis.  ------------------------------------------------------------ Tricuspid valve: Structurally normal valve. Doppler: Transvalvular velocity was within the normal range. Mild regurgitation.  ------------------------------------------------------------ Pulmonary artery: Poorly visualized. The main pulmonary artery was normal-sized. Systolic pressure was moderately to severely increased.  ------------------------------------------------------------ Right atrium: The atrium was mildly dilated.  ------------------------------------------------------------ Pericardium: There was no pericardial effusion.  ------------------------------------------------------------ Systemic veins: Inferior vena cava: The vessel was dilated; the respirophasic diameter changes were blunted (< 50%); findings are consistent with elevated central venous pressure.  ------------------------------------------------------------  2D measurements Normal Doppler measurements Normal Left ventricle Main pulmonary LVID ED, 53.9 mm 43-52 artery chord, PLAX Pressure, S 64 mm =30 LVID ES, 49.2 mm 23-38 Hg chord, PLAX LVOT FS,  chord, 9 % >29 Peak vel, S 77 cm/s ------ PLAX Aortic valve LVPW, ED 12.2 mm ------ Peak vel, S 446 cm/s ------ IVS/LVPW 0.94 <1.3 Mean vel, S 320 cm/s ------ ratio, ED VTI, S 88. cm ------ Vol ED, MOD1 173 ml ------ 5 Vol ED, MOD2 172 ml ------ Mean 47 mm ------ Ventricular septum gradient, S Hg IVS, ED 11.5 mm ------ Peak 80 mm ------ LVOT gradient, S Hg Diam, S 19 mm ------ Area, VTI 0.6 cm^2  ------ Area 2.84 cm^2 ------ 1 Aorta Peak vel 0.1 ------ Root diam, 30 mm ------ ratio, 7 ED LVOT/AV Left atrium Area, Vmax 0.4 cm^2 ------ AP dim 49 mm ------ 9 Vol, S 81.5 ml ------ Regurg PHT 148 ms ------ Mitral valve Peak E vel 116 cm/s ------ Peak A vel 69 cm/s ------ Deceleration 148 ms 150-23 time 0 Peak 5 mm ------ gradient, D Hg Peak E/A 1.7 ------ ratio Regurg alias 38. cm/s ------ vel, PISA 5 Max regurg 568 cm/s ------ vel Regurg VTI 155 cm ------ ERO, PISA 0.0 cm^2 ------ 7 Regurg vol, 11 ml ------ PISA Tricuspid valve Regurg peak 369 cm/s ------ vel Peak RV-RA 54 mm ------ gradient, S Hg Max regurg 369 cm/s ------ vel Systemic veins Estimated CVP 10 mm ------ Hg Right ventricle Pressure, S 64 mm <30 Hg  ------------------------------------------------------------ Prepared and Electronically Authenticated by  Everette Rank, MD 2014-12-05T18:24:[id number]   Left and Right Heart Catheterization with Coronary Angiography Report  Cathy Moody  62 y.o.  female  1951-01-25  Procedure Date: 08/01/2013  Referring Physician: J.Varanasi, MD  Primary Cardiologist:: Catalina Gravel  INDICATIONS: Critical aortic stenosis (by echo) with severe left ventricular systolic dysfunction(LVEF less than 30% by echo) the  PROCEDURE: 1. Left heart catheterization; 2. Coronary angiography; 3. Right heart catheterization  CONSENT:  The risks, benefits, and details of the procedure were explained in detail to the patient. Risks including death, stroke, heart attack, kidney injury, allergy, limb ischemia, bleeding and radiation injury were discussed. The patient verbalized understanding and wanted to proceed. Informed written consent was obtained.  PROCEDURE TECHNIQUE: After Xylocaine anesthesia a 5 French sheath was placed in the right femoral artery with after multiple sticks. The right femoral vein was entered after multiple passes and a 7 French sheath inserted using the modified  Seldinger technique. We did not attempt to cross the aortic valve. Coronary angiography was done using a 5 [place] JL 4 and JR 4 catheters. Left ventriculography was not done.  No sedation was administered because of significant lung disease and tendency towards hypoxia  CONTRAST: Total of 60 cc.  COMPLICATIONS: None  HEMODYNAMICS: Aortic pressure 99/58 mmHg; LV pressure (we did not attempt to cross the aortic valve); LVEDP not obtained, RA 14 mm mercury, RV 65/14 mmHg, PA 65/31 mm mercury, PCWP(mean) mean of 31 mm mercury, Cardiac Output 3.07 L per minute by Fick determination.  ANGIOGRAPHIC DATA: The left main coronary artery is normal.  The left anterior descending artery is widely patent giving origin to a large diagonal which is also widely patent..  The left circumflex artery is 2 obtuse marginal branches with the first a trifurcating vessel. Widely patent..  The right coronary artery is dominant and widely patent.Marland Kitchen  LEFT VENTRICULOGRAM: Left ventricular angiogram was done in the 30 RAO projection and revealed not performed as the aortic valve was not retrogradely crossed.  IMPRESSIONS: 1. Widely patent coronary arteries with no evidence of significant CAD  2. Moderate to severe pulmonary hypertension  3. Known severe left ventricular systolic dysfunction, EF less than 30% by echo  4. Critical aortic stenosis based upon echo findings  RECOMMENDATION: Referral to heart valve team. Phoned in to [facility].   STS Risk Calculator  Procedure Name Isolated AVRepl  Risk of Mortality 9.957%  Morbidity or Mortality 47.207%  Long Length of Stay 28.957%  Short Length of Stay 16.332%  Permanent Stroke 1.569%  Prolonged Ventilation 41.921%  DSW Infection 0.404%  Renal Failure 5.489%  Reoperation 14.124%      Note: This is assuming she has some degree of cardiogenic shock with borderline low BP, CI of 1.7 at cath, venous O2 sat of 54%. Without cardiogenic shock her risk of mortality is  6.4%.  Impression:  This woman has critical AS with severe LV dysfunction, severe pulmonary hypertension and I suspect has severe COPD with a PCO2 of 64 at cath. She is morbidly obese with a small annular size and with open surgical valve replacement I think I would only be able to insert a 19 mm valve which would leave her with a significant gradient with a sick heart. She would be at very high risk and would not do well if she survived. An annular enlargement or Bentall procedure may allow a slightly larger valve to be inserted but at significant additional risk that I think is prohibitive. I think she did have early signs of cardiogenic shock on presentation. I think consideration of TAVR is reasonable in this high risk patient with a small annular size.   Plan:  I will discuss further with Dr. Excell Seltzer to get his opinion. She is scheduled for PFT's. If Dr. Excell Seltzer feels that TAVR is reasonable then we will initiate the preoperative studies.   Alleen Borne, MD 08/02/2013 12:43 PM

## 2013-08-02 NOTE — Progress Notes (Signed)
Subjective:   62 y/o woman with obesity, COPD, ETOH and ongoing tobacco use admitted with acute HF.   Echo 12/5  with biventricular dysfunction EF 25-30% Mild RV HK. Severe AS (Mean gradient 47mm HG)  Diuresed well. Lasix held yesterday due to cath. No CP. Wants regular food. Does not feel as well today.  Having to sit up more than she did yesterday AM.   Intake/Output Summary (Last 24 hours) at 08/02/13 0843 Last data filed at 08/01/13 1900  Gross per 24 hour  Intake    120 ml  Output    351 ml  Net   -231 ml    Current meds: . bisoprolol  2.5 mg Oral Daily  . folic acid  1 mg Oral Daily  . furosemide  40 mg Intravenous BID  . heparin  5,000 Units Subcutaneous Q8H  . levofloxacin (LEVAQUIN) IV  500 mg Intravenous Q24H  . multivitamin with minerals  1 tablet Oral Daily   Infusions:     Objective:  Blood pressure 105/63, pulse 94, temperature 97.8 F (36.6 C), temperature source Oral, resp. rate 16, height 5' (1.524 m), weight 182 lb 1.6 oz (82.6 kg), SpO2 98.00%. Weight change: -3 lb 15.5 oz (-1.8 kg)  Physical Exam: Constitutional: She is oriented to person, place, and time. She appears well-developed and well-nourished. No distress.  Appears older than stated age Neck:  Carotid bruit is not present.  Cardiovascular: Normal rate, regular rhythm and intact distal pulses.  Murmur (3/6 best heard at RUSB) heard. S2 markedly decreased Respiratory Markedly decreased breath sounds bilaterally.   No CVA tenderness. Extremities:  1+ edema; 2+ right DP pulse; right groin bruising Neurological: She is alert and oriented to person, place, and time.  Skin: Skin is warm and dry. She is not diaphoretic.  Psychiatric: She has a normal mood and affect. Her behavior is normal.    Telemetry: NSR Lab Results: Basic Metabolic Panel:  Recent Labs Lab 07/29/13 1137 07/29/13 1825 07/30/13 0345 07/31/13 0549 08/01/13 0448 08/01/13 1701  NA 135  --  138 139 137  --   K 4.3   --  3.9 4.7 4.2  --   CL 97  --  95* 94* 93*  --   CO2 25  --  33* 38* 39*  --   GLUCOSE 126*  --  113* 119* 117*  --   BUN 9  --  10 12 19   --   CREATININE 0.71 0.82 0.90 0.90 1.31* 0.99  CALCIUM 9.0  --  8.8 8.7 8.8  --    Liver Function Tests:  Recent Labs Lab 07/30/13 0345  AST 20  ALT 17  ALKPHOS 70  BILITOT 0.7  PROT 6.8  ALBUMIN 3.3*   No results found for this basename: LIPASE, AMYLASE,  in the last 168 hours No results found for this basename: AMMONIA,  in the last 168 hours CBC:  Recent Labs Lab 07/29/13 1137 07/29/13 1825 07/30/13 0345 08/01/13 1701  WBC 12.0* 10.2 9.8 8.9  HGB 14.7 14.7 14.4 13.5  HCT 43.9 44.5 44.4 42.9  MCV 100.0 102.3* 104.2* 104.9*  PLT 330 337 309 313   Cardiac Enzymes:  Recent Labs Lab 07/29/13 1820 07/29/13 2240 07/30/13 0345  TROPONINI <0.30 <0.30 <0.30   BNP: No components found with this basename: POCBNP,  CBG: No results found for this basename: GLUCAP,  in the last 168 hours Microbiology: No results found for this basename: cult   No results  found for this basename: CULT, SDES,  in the last 168 hours  Imaging: No results found.   ASSESSMENT:  1. Acute systolic HF 2. Severe aortic stenosis 3. Acute respiratory failure 4. Severe COPD with ongoing tobacco use 5. ETOH use - on CIWA protocol 5. Morbid obesity  PLAN/DISCUSSION:  She has severe AS and markedly reduced LV function. Given her tobacco use, No significant CAD by cath yesterday. COPD (by exam COPD seems fairly severe) may impact options to fix valve.  TAVR vs. convententional AVR.  Volume status improved with diuresis. Cr better yesterday.    May need to  get PFTs.   Continue low dose b-blocker  She had L flank pain. U/A with negative nitrites, + LE.  Watch for dysuria.  Hold losartan today given  the fact that she has had some low BP.  May be related to aggressive diuresis. Cautious of hypotension given severe AS.  Restart if BP  increases.  Await CT surgery opinion.   LOS: 4 days    Corky Crafts., MD 08/02/2013, 8:43 AM

## 2013-08-03 ENCOUNTER — Telehealth: Payer: Self-pay | Admitting: Interventional Cardiology

## 2013-08-03 ENCOUNTER — Encounter (HOSPITAL_COMMUNITY): Payer: Self-pay | Admitting: Radiology

## 2013-08-03 ENCOUNTER — Inpatient Hospital Stay (HOSPITAL_COMMUNITY): Payer: BC Managed Care – PPO

## 2013-08-03 DIAGNOSIS — I272 Pulmonary hypertension, unspecified: Secondary | ICD-10-CM | POA: Diagnosis present

## 2013-08-03 DIAGNOSIS — F411 Generalized anxiety disorder: Secondary | ICD-10-CM | POA: Clinically undetermined

## 2013-08-03 LAB — BASIC METABOLIC PANEL
BUN: 17 mg/dL (ref 6–23)
CO2: 38 mEq/L — ABNORMAL HIGH (ref 19–32)
Calcium: 8.7 mg/dL (ref 8.4–10.5)
Chloride: 91 mEq/L — ABNORMAL LOW (ref 96–112)
Creatinine, Ser: 0.96 mg/dL (ref 0.50–1.10)
GFR calc Af Amer: 72 mL/min — ABNORMAL LOW (ref >90)
GFR calc non Af Amer: 63 mL/min — ABNORMAL LOW (ref >90)
Sodium: 136 mEq/L (ref 135–145)

## 2013-08-03 LAB — T3, FREE: T3, Free: 2.4 pg/mL (ref 2.3–4.2)

## 2013-08-03 MED ORDER — LORAZEPAM 0.5 MG PO TABS
0.5000 mg | ORAL_TABLET | Freq: Four times a day (QID) | ORAL | Status: DC | PRN
  Administered 2013-08-03 – 2013-08-08 (×11): 0.5 mg via ORAL
  Filled 2013-08-03 (×12): qty 1

## 2013-08-03 MED ORDER — METOPROLOL TARTRATE 1 MG/ML IV SOLN
INTRAVENOUS | Status: AC
  Administered 2013-08-03: 5 mg
  Filled 2013-08-03: qty 5

## 2013-08-03 MED ORDER — NITROGLYCERIN 0.4 MG SL SUBL
SUBLINGUAL_TABLET | SUBLINGUAL | Status: AC
  Filled 2013-08-03: qty 25

## 2013-08-03 MED ORDER — LEVOFLOXACIN 500 MG PO TABS
500.0000 mg | ORAL_TABLET | Freq: Every day | ORAL | Status: DC
  Administered 2013-08-04 – 2013-08-05 (×2): 500 mg via ORAL
  Filled 2013-08-03 (×2): qty 1

## 2013-08-03 MED ORDER — IOHEXOL 350 MG/ML SOLN
80.0000 mL | Freq: Once | INTRAVENOUS | Status: AC | PRN
  Administered 2013-08-03: 80 mL via INTRAVENOUS

## 2013-08-03 MED ORDER — METOPROLOL TARTRATE 1 MG/ML IV SOLN: 5.0000 mg | INTRAVENOUS | Status: DC | PRN

## 2013-08-03 NOTE — Telephone Encounter (Signed)
I attempted to call the son's phone number which appears to be an incorrect number as call will not go through.  The son is not listed as emergency contact for patient so I am not going to pursue getting in touch with him as this would violate patient's privacy.

## 2013-08-03 NOTE — Progress Notes (Signed)
Brought down  For ct scan of the heart by wheelchair.Placed on the table at around 1645H, started feeling anxious claiming that she cannot lay flat although  2 pillows on. Music made available for her to calm down, successful for few min. BP 110/85, hr -90's, lopressor 5 mg iv given as ordered. Subsequently hr went down to 70's. When ct scan started she started to become clostrophobic, became sweaty and anxious. Procedure stopped at once.calmed down and brought back to her room. Continue to monitor.

## 2013-08-03 NOTE — Progress Notes (Signed)
Chart reviewed.  [facility] PROGRESS NOTE  HORACE WISHON ZOX:[id number] DOB: 1951/03/25 DOA: 07/29/2013 PCP: Ernst Breach, PA-C  Assessment/Plan: 62 y.o. female with PMH of asthma, alcoholism, tobacco use presented with SOB, progressive DOE, PND, orthopnea, associated with LE edema found in acute CHF   Acute decompensated CHF, systolic, biventricular; echo: LVEF 25%; diffuse hypokinesia; AVA 0.6, RVSP 64: dyspnea better.  Severe AS: preop w/u underway.  PFTs show severe COPD  Severe COPD; likely chronic CO2 retainer; cont prn bronchodilators, added spiriva, advair;  Moderate to severe pulmonary hypertension  Substance abuse, tobacco, etoh; c/o anxiety about her health, being in hospital, but CIWAs low per RN.  Pt wishes to quit smoking  elevated TSH:  fT4 normal. Check fT3   Probable UTI, cultures never done.  Change levaquin to PO. Course 5 days.  Anxiety: prn ativan  Code Status: full Family Communication: family at bedside Disposition Plan: pend improvement    Consultants:  cardiology   TCTS  Procedures:  Echo: LVEF 25%; AVA 0.6  Antibiotics:  levaquin 12/8  HPI/Subjective: Felt panicked in the night. Dyspnea better. Edema better  Objective: Filed Vitals:   08/03/13 0803  BP: 93/40  Pulse: 77  Temp: 97.7 F (36.5 C)  Resp: 22    Intake/Output Summary (Last 24 hours) at 08/03/13 1032 Last data filed at 08/03/13 0918  Gross per 24 hour  Intake    100 ml  Output    800 ml  Net   -700 ml   Filed Weights   08/01/13 0422 08/02/13 0357 08/03/13 0402  Weight: 84.4 kg (186 lb 1.1 oz) 82.6 kg (182 lb 1.6 oz) 82 kg (180 lb 12.4 oz)    Exam:   General:  Alert. Comfortable. Calm and cooperative  Cardiovascular: rrr,  Harsh AS murmur  Respiratory: diminished throughout. Occasional wheeze.  No rhonchi or rales  Abdomen: soft, nt, nd   Musculoskeletal: LE edema   Data Reviewed: Basic Metabolic Panel:  Recent Labs Lab  07/29/13 1137  07/30/13 0345 07/31/13 0549 08/01/13 0448 08/01/13 1701 08/03/13 0355  NA 135  --  138 139 137  --  136  K 4.3  --  3.9 4.7 4.2  --  4.1  CL 97  --  95* 94* 93*  --  91*  CO2 25  --  33* 38* 39*  --  38*  GLUCOSE 126*  --  113* 119* 117*  --  139*  BUN 9  --  10 12 19   --  17  CREATININE 0.71  < > 0.90 0.90 1.31* 0.99 0.96  CALCIUM 9.0  --  8.8 8.7 8.8  --  8.7  < > = values in this interval not displayed. Liver Function Tests:  Recent Labs Lab 07/30/13 0345  AST 20  ALT 17  ALKPHOS 70  BILITOT 0.7  PROT 6.8  ALBUMIN 3.3*   No results found for this basename: LIPASE, AMYLASE,  in the last 168 hours No results found for this basename: AMMONIA,  in the last 168 hours CBC:  Recent Labs Lab 07/29/13 1137 07/29/13 1825 07/30/13 0345 08/01/13 1701  WBC 12.0* 10.2 9.8 8.9  HGB 14.7 14.7 14.4 13.5  HCT 43.9 44.5 44.4 42.9  MCV 100.0 102.3* 104.2* 104.9*  PLT 330 337 309 313   Cardiac Enzymes:  Recent Labs Lab 07/29/13 1820 07/29/13 2240 07/30/13 0345  TROPONINI <0.30 <0.30 <0.30   BNP (last 3 results)  Recent Labs  07/29/13 1138  PROBNP  26344.0*   CBG: No results found for this basename: GLUCAP,  in the last 168 hours  Recent Results (from the past 240 hour(s))  MRSA PCR SCREENING     Status: None   Collection Time    07/29/13  3:59 PM      Result Value Range Status   MRSA by PCR NEGATIVE  NEGATIVE Final   Comment:            The GeneXpert MRSA Assay (FDA     approved for NASAL specimens     only), is one component of a     comprehensive MRSA colonization     surveillance program. It is not     intended to diagnose MRSA     infection nor to guide or     monitor treatment for     MRSA infections.     Studies: No results found.  Scheduled Meds: . bisoprolol  2.5 mg Oral Daily  . folic acid  1 mg Oral Daily  . furosemide  40 mg Intravenous BID  . heparin  5,000 Units Subcutaneous Q8H  . levofloxacin (LEVAQUIN) IV  500 mg  Intravenous Q24H  . mometasone-formoterol  2 puff Inhalation BID  . multivitamin with minerals  1 tablet Oral Daily  . tiotropium  18 mcg Inhalation Daily   Continuous Infusions:   Time spent: >35 minutes   Zacharee [name] L  [facility] Pager [phone]. If 7PM-7AM, please contact night-coverage at [email or web address], password [facility] 08/03/2013, 10:32 AM  LOS: 5 days

## 2013-08-03 NOTE — Progress Notes (Signed)
Had short runs of v-tach, asymptomatic, continue to monitor. 

## 2013-08-03 NOTE — Telephone Encounter (Signed)
New problem    Patient son calling asking for questions regarding mother hospital stay.    Mother is telling him one thing . Son is just unsure

## 2013-08-03 NOTE — Consult Note (Signed)
MULTIDISCIPLINARY HEART VALVE CLINIC NOTE  Patient ID: GLENDI MOHIUDDIN MRN: [id number] DOB/AGE: 1951/07/04 62 y.o.  Primary Care Physician:TYSINGER, Mertha Finders, PA-C Primary Cardiologist: Dr Eldridge Dace  HPI:  62 year-old woman who presented with about 4-6 weeks of progressive shortness of breath, orthopnea, and leg swelling. She has been chronically dyspneic, but much worse over the past few weeks. She has had some anterior chest discomfort with this as well. No lightheadedness or presyncope. The patient has been a heavy smoker for many years and is on inhaled therapies as an outpatient.   She has a longstanding heart murmur and has been told in the past that she has aortic stenosis, but it had not been severe. She had a heart catheterization at age 15 before pregnancy. She has not had regular medical follow-up over the years.   On admission she was treated for heart failure with IV diuresis. An echo showed severe LV dysfunction with LVEF 25-30% and critical aortic stenosis with aortic valve area 0.6 square cm. She has undergone heart catheterization showing no obstructive CAD. We are asked to evaluate her for consideration of treatment options for her severe AS.  The patient lives at home with her boyfriend. She watches her grandchildren during the day. She has been reasonably active until the last 2 months. Currently she has been unable to do any physical activity because of severe dyspnea.   Past Medical History  Diagnosis Date  . Asthma   . Heart murmur   . Acute CHF     [name] 07/29/2013  . Alcohol abuse     /notes 07/29/2013  . Hypotension     History reviewed. No pertinent past surgical history.  Family History  Problem Relation Age of Onset  . Diabetes Paternal Grandmother     History   Social History  . Marital Status: Divorced    Spouse Name: N/A    Number of Children: N/A  . Years of Education: N/A   Occupational History  . Not on file.   Social History Main Topics  .  Smoking status: Current Some Day Smoker    Types: Cigarettes  . Smokeless tobacco: Not on file  . Alcohol Use: Yes     Comment: Pt is weekend drinker  . Drug Use: No  . Sexual Activity: Not on file   Other Topics Concern  . Not on file   Social History Narrative  . No narrative on file    Meds: bisoprolol 2.5 mg, folic acid 1 mg, furosemide 40 mg IV BID, levaquin 500 mg daily, dulera, spiriva, MVI  No Known Allergies  ROS:  General: no fevers/chills/night sweats Eyes: no blurry vision, diplopia, or amaurosis ENT: no sore throat or hearing loss Resp: positive for shortness of breath, cough, wheezing. No hemoptysis. CV: see HPI GI: no abdominal pain, nausea, vomiting, diarrhea, or constipation GU: no dysuria, frequency, or hematuria Skin: no rash Neuro: no headache, numbness, tingling, or weakness of extremities Musculoskeletal: no joint pain or swelling Heme: no bleeding, DVT, or easy bruising Endo: no polydipsia or polyuria  BP 93/40  Pulse 77  Temp(Src) 97.7 F (36.5 C) (Oral)  Resp 22  Ht 5' (1.524 m)  Wt 180 lb 12.4 oz (82 kg)  BMI 35.31 kg/m2  SpO2 98%  PHYSICAL EXAM: Pt is alert and oriented, appears older than her stated age, in no distress. HEENT: normal Neck: JVP elevated approximately 10 cm. Carotid upstrokes delayed. No thyromegaly. Lungs: scattered rhonchi, diminished air movement throughout CV: Apex  is discrete and nondisplaced, RRR with grade 3/6 harsh systolic murmur at the  Abd: soft, NT, +BS, no bruit, no hepatosplenomegaly Back: no CVA tenderness Ext: trace pretibial edema bilaterally        Femoral pulses 2+=  Skin: warm and dry without rash Neuro: CNII-XII intact             Strength intact = bilaterally  EKG:  Sinus tachycardia, LVH, pulmonary disease pattern  2D ECHO:  Left ventricle: The cavity size was normal. Systolic function was severely reduced. The estimated ejection fraction was in the range of 25% to 30%.  Diffuse hypokinesis.  ------------------------------------------------------------ Aortic valve: Trileaflet; severely calcified leaflets. Mobility was not restricted. Doppler: Transvalvular velocity was increased. There was severe stenosis. Mild regurgitation. Valve area: 0.61cm^2(VTI). Peak velocity ratio of LVOT to aortic valve: 0.17. Valve area: 0.49cm^2 (Vmax). Mean gradient: 47mm Hg (S). Peak gradient: 80mm Hg (S).  ------------------------------------------------------------ Aorta: Aortic root: The aortic root was normal in size.  ------------------------------------------------------------ Mitral valve: Calcified annulus. Mobility was not restricted. Doppler: Transvalvular velocity was within the normal range. There was no evidence for stenosis. Mild to moderate regurgitation. Peak gradient: 5mm Hg (D).  ------------------------------------------------------------ Left atrium: The atrium was mildly to moderately dilated.  ------------------------------------------------------------ Atrial septum: Poorly visualized.  ------------------------------------------------------------ Right ventricle: The cavity size was mildly dilated. Wall thickness was normal. Systolic function was mildly reduced.  ------------------------------------------------------------ Pulmonic valve: Poorly visualized. Doppler: Transvalvular velocity was within the normal range. There was no evidence for stenosis.  ------------------------------------------------------------ Tricuspid valve: Structurally normal valve. Doppler: Transvalvular velocity was within the normal range. Mild regurgitation.  ------------------------------------------------------------ Pulmonary artery: Poorly visualized. The main pulmonary artery was normal-sized. Systolic pressure was moderately to severely increased.  ------------------------------------------------------------ Right atrium: The atrium was mildly  dilated.  ------------------------------------------------------------ Pericardium: There was no pericardial effusion.  ------------------------------------------------------------ Systemic veins: Inferior vena cava: The vessel was dilated; the respirophasic diameter changes were blunted (< 50%); findings are consistent with elevated central venous pressure.  CARDIAC CATH:  HEMODYNAMICS: Aortic pressure 99/58 mmHg; LV pressure (we did not attempt to cross the aortic valve); LVEDP not obtained, RA 14 mm mercury, RV 65/14 mmHg, PA 65/31 mm mercury, PCWP(mean) mean of 31 mm mercury, Cardiac Output 3.07 L per minute by Fick determination.  ANGIOGRAPHIC DATA: The left main coronary artery is normal.  The left anterior descending artery is widely patent giving origin to a large diagonal which is also widely patent..  The left circumflex artery is 2 obtuse marginal branches with the first a trifurcating vessel. Widely patent..  The right coronary artery is dominant and widely patent.Marland Kitchen  LEFT VENTRICULOGRAM: Left ventricular angiogram was done in the 30 RAO projection and revealed not performed as the aortic valve was not retrogradely crossed.  IMPRESSIONS: 1. Widely patent coronary arteries with no evidence of significant CAD  2. Moderate to severe pulmonary hypertension  3. Known severe left ventricular systolic dysfunction, EF less than 30% by echo  4. Critical aortic stenosis based upon echo findings   STS RISK CALCULATOR:  Procedure Name  Isolated AVRepl   Risk of Mortality  9.957%   Morbidity or Mortality  47.207%   Long Length of Stay  28.957%   Short Length of Stay  16.332%   Permanent Stroke  1.569%   Prolonged Ventilation  41.921%   DSW Infection  0.404%   Renal Failure  5.489%   Reoperation  14.124%    ASSESSMENT AND PLAN:  62 year-old woman with critical AS, severe global cardiomyopathy likely  related to aortic stenosis, and acute on chronic systolic heart failure, NYHA Class  4. Also noted to have severe pulmonary HTN and likely has severe COPD based on long hx of tobacco abuse and suggestive physical exam. I have personally reviewed her cath and echo images. Have discussed her case with Dr Laneta Simmers and agree that she would be at very high-risk of open cardiac surgery with the additional problem related to her small annular size.   I have discussed treatment options with the patient, her mother, and her son at length. We have contrasted TAVR and open surgical AVR and have reviewed the necessary workup, potential complications, and expected course.   Will arrange a gated cardiac CTA to assess valve morphology (suspect bicuspid), annular size, and other characteristics. Will also do a CTA of the chest, abdomen, and pelvis to evaluate vascular access. Other preop studies will be arranged.   Will follow-up with the patient after these studies are completed. All questions were answered. Thanks for asking our team to evaluate her.   Tonny Bollman 08/03/2013 9:18 AM

## 2013-08-03 NOTE — Progress Notes (Addendum)
VASCULAR LAB PRELIMINARY  PRELIMINARY  PRELIMINARY  PRELIMINARY  Pre-op Cardiac Surgery  Carotid Findings:  Bilateral:  1-39% ICA stenosis.  Vertebral artery flow is antegrade.    Thereasa Parkin, RVT 08/03/2013 1:32 PM      Upper Extremity Right Left  Brachial Pressures 109 triphasic 105 triphasic  Radial Waveforms triphasic triphasic  Ulnar Waveforms triphasic triphasic  Palmar Arch (Allen's Test) WNL WNL   Findings:  Doppler waveforms remain normal with ulnar and radial compressions bilaterally.

## 2013-08-04 ENCOUNTER — Inpatient Hospital Stay (HOSPITAL_COMMUNITY): Payer: BC Managed Care – PPO

## 2013-08-04 DIAGNOSIS — J449 Chronic obstructive pulmonary disease, unspecified: Secondary | ICD-10-CM

## 2013-08-04 DIAGNOSIS — I2789 Other specified pulmonary heart diseases: Secondary | ICD-10-CM

## 2013-08-04 DIAGNOSIS — R946 Abnormal results of thyroid function studies: Secondary | ICD-10-CM

## 2013-08-04 DIAGNOSIS — J4489 Other specified chronic obstructive pulmonary disease: Secondary | ICD-10-CM

## 2013-08-04 DIAGNOSIS — I359 Nonrheumatic aortic valve disorder, unspecified: Secondary | ICD-10-CM

## 2013-08-04 MED ORDER — BISOPROLOL FUMARATE 5 MG PO TABS
2.5000 mg | ORAL_TABLET | ORAL | Status: AC
  Filled 2013-08-04: qty 0.5

## 2013-08-04 MED ORDER — MUSCLE RUB 10-15 % EX CREA
TOPICAL_CREAM | CUTANEOUS | Status: DC | PRN
  Administered 2013-08-05 – 2013-08-06 (×2): via TOPICAL
  Filled 2013-08-04: qty 85

## 2013-08-04 MED ORDER — BISOPROLOL FUMARATE 5 MG PO TABS: 2.5000 mg | ORAL_TABLET | Freq: Every day | ORAL | Status: DC

## 2013-08-04 NOTE — Progress Notes (Signed)
Pt. Continues to refuses CPAP at this time.

## 2013-08-04 NOTE — Progress Notes (Signed)
Subjective:   62 y/o woman with obesity, COPD, ETOH and ongoing tobacco use admitted with acute HF.   Echo 12/5  with biventricular dysfunction EF 25-30% Mild RV HK. Severe AS (Mean gradient 47mm HG)  Diuresed well. Renal function has improved.  Intake/Output Summary (Last 24 hours) at 08/04/13 1305 Last data filed at 08/04/13 1100  Gross per 24 hour  Intake    580 ml  Output   2121 ml  Net  -1541 ml    Current meds: . bisoprolol  2.5 mg Oral Daily  . bisoprolol  2.5 mg Oral STAT  . folic acid  1 mg Oral Daily  . furosemide  40 mg Intravenous BID  . heparin  5,000 Units Subcutaneous Q8H  . levofloxacin  500 mg Oral Daily  . mometasone-formoterol  2 puff Inhalation BID  . multivitamin with minerals  1 tablet Oral Daily  . tiotropium  18 mcg Inhalation Daily   Infusions:     Objective:  Blood pressure 83/46, pulse 79, temperature 98.2 F (36.8 C), temperature source Oral, resp. rate 22, height 5' (1.524 m), weight 181 lb (82.1 kg), SpO2 99.00%. Weight change: 3.5 oz (0.1 kg)  Physical Exam: Constitutional: She is oriented to person, place, and time. She appears well-developed and well-nourished. No distress.  Appears older than stated age Neck:  Carotid bruit is not present.  Cardiovascular: Normal rate, regular rhythm and intact distal pulses.  Murmur (3/6 best heard at RUSB) heard. S2 markedly decreased Respiratory Markedly decreased breath sounds bilaterally.   No CVA tenderness. Extremities:  1+ edema; 2+ right DP pulse;  Neurological: She is alert and oriented to person, place, and time.  Skin: Skin is warm and dry. She is not diaphoretic.  Psychiatric: She has a normal mood and affect. Her behavior is normal.    Telemetry: NSR Lab Results: Basic Metabolic Panel:  Recent Labs Lab 07/29/13 1137  07/30/13 0345 07/31/13 0549 08/01/13 0448 08/01/13 1701 08/03/13 0355  NA 135  --  138 139 137  --  136  K 4.3  --  3.9 4.7 4.2  --  4.1  CL 97  --  95*  94* 93*  --  91*  CO2 25  --  33* 38* 39*  --  38*  GLUCOSE 126*  --  113* 119* 117*  --  139*  BUN 9  --  10 12 19   --  17  CREATININE 0.71  < > 0.90 0.90 1.31* 0.99 0.96  CALCIUM 9.0  --  8.8 8.7 8.8  --  8.7  < > = values in this interval not displayed. Liver Function Tests:  Recent Labs Lab 07/30/13 0345  AST 20  ALT 17  ALKPHOS 70  BILITOT 0.7  PROT 6.8  ALBUMIN 3.3*   No results found for this basename: LIPASE, AMYLASE,  in the last 168 hours No results found for this basename: AMMONIA,  in the last 168 hours CBC:  Recent Labs Lab 07/29/13 1137 07/29/13 1825 07/30/13 0345 08/01/13 1701  WBC 12.0* 10.2 9.8 8.9  HGB 14.7 14.7 14.4 13.5  HCT 43.9 44.5 44.4 42.9  MCV 100.0 102.3* 104.2* 104.9*  PLT 330 337 309 313   Cardiac Enzymes:  Recent Labs Lab 07/29/13 1820 07/29/13 2240 07/30/13 0345  TROPONINI <0.30 <0.30 <0.30   BNP: No components found with this basename: POCBNP,  CBG: No results found for this basename: GLUCAP,  in the last 168 hours Microbiology: No results found  for this basename: cult   No results found for this basename: CULT, SDES,  in the last 168 hours  Imaging: No results found.   ASSESSMENT:  1. Acute systolic HF 2. Severe aortic stenosis 3. Acute respiratory failure 4. Severe COPD with ongoing tobacco use 5. ETOH use - on CIWA protocol 5. Morbid obesity  PLAN/DISCUSSION:  She has severe AS and markedly reduced LV function. Given her tobacco use, No significant CAD by cath yesterday. COPD (by exam COPD seems fairly severe) may impact options to fix valve.  TAVR vs. convententional AVR.  Unfortunately, she did not tolerate CT scan due to claustrophobia. She is still in the process of being evaluated for what the best option will be to repair her aortic valve.  I discussed this with Dr. Excell Seltzer. They will attempt a CT scan again. She is willing to try with a little bit of sedation. We can give some Valium pre-procedure and this may  help her tolerate lying down in the CT scanner. She does not think it was shortness of breath that made her sit up.  Volume status improved with diuresis. Cr better yesterday.    May need to  get PFTs.   Continue low dose b-blocker  She had L flank pain. U/A with negative nitrites, + LE.  Watch for dysuria.  Hold losartan today given  the fact that she has had some low BP.  May be related to aggressive diuresis. Cautious of hypotension given severe AS.  Restart if BP increases.     LOS: 6 days    Corky Crafts., MD 08/04/2013, 1:05 PM

## 2013-08-04 NOTE — Progress Notes (Signed)
[facility] Progress Note Cathy Moody TEAM 1 - Stepdown/ICU TEAM   Cathy Moody ZOX:[id number] DOB: 07-10-1951 DOA: 07/29/2013 PCP: Ernst Breach, PA-C  Brief narrative: 62 y.o. female with PMH of asthma, alcoholism, tobacco use presented with SOB, progressive DOE, PND, orthopnea, associated with LE edema found in acute CHF. Per patient, she has not seen her doctor in over a year. She has been using her inhaler excessively over the past week prior to admission but her symptoms continued to progress.   Assessment/Plan: Principal Problem:   Acute CHF with systolic heart failure and severe AS and mod to severe pulm HTN - cardiology and CT surgery following- TAVR being considered as her pulmonary function places her a high risk for open heart surgery - diuresing adequately and now > 6 L in negative balance - Cath negative for significant CAD  Active Problems:   COPD, severe - cont current treatment with inhalers - cough productive of clear sputum    Tobacco abuse - advised to quit    Anxiety state, unspecified - cont Ativan PRN    Elevated TSH - Free T4 normal and therefore likely sick euthyroid syndrome  UTI - Levaquin x 5 days- will cut back to 250 mg daily to cover for UTI only  ETOH abuse - no signs of withdrawal  Obesity   Code Status: full code Family Communication: met mother today Disposition Plan: follow in SDU  Consultants: Cardiology CT surgery  Procedures: 12/8 cardiac cath- left and right heart  Antibiotics: Levaquin- 12/11  DVT prophylaxis: Heparin  HPI/Subjective: Her dyspnea has improved considerably to where she can move about the room without distress. Has a congested cough with thin, clear sputum. No other complaints.    Objective: Blood pressure 100/45, pulse 74, temperature 98.2 F (36.8 C), temperature source Oral, resp. rate 22, height 5' (1.524 m), weight 82.1 kg (181 lb), SpO2 100.00%.  Intake/Output Summary (Last 24  hours) at 08/04/13 1656 Last data filed at 08/04/13 1600  Gross per 24 hour  Intake   1060 ml  Output   2421 ml  Net  -1361 ml     Exam: General: No acute respiratory distress Lungs: Clear to auscultation bilaterally without wheezes or crackles- Cardiovascular: Regular rate and rhythm- murmur in aortic area Abdomen: Nontender, nondistended, soft, bowel sounds positive, no rebound, no ascites, no appreciable mass Extremities: No significant cyanosis, clubbing, mild pitting edema  Data Reviewed: Basic Metabolic Panel:  Recent Labs Lab 07/29/13 1137  07/30/13 0345 07/31/13 0549 08/01/13 0448 08/01/13 1701 08/03/13 0355  NA 135  --  138 139 137  --  136  K 4.3  --  3.9 4.7 4.2  --  4.1  CL 97  --  95* 94* 93*  --  91*  CO2 25  --  33* 38* 39*  --  38*  GLUCOSE 126*  --  113* 119* 117*  --  139*  BUN 9  --  10 12 19   --  17  CREATININE 0.71  < > 0.90 0.90 1.31* 0.99 0.96  CALCIUM 9.0  --  8.8 8.7 8.8  --  8.7  < > = values in this interval not displayed. Liver Function Tests:  Recent Labs Lab 07/30/13 0345  AST 20  ALT 17  ALKPHOS 70  BILITOT 0.7  PROT 6.8  ALBUMIN 3.3*   No results found for this basename: LIPASE, AMYLASE,  in the last 168 hours No results found for this basename: AMMONIA,  in the last 168 hours CBC:  Recent Labs Lab 07/29/13 1137 07/29/13 1825 07/30/13 0345 08/01/13 1701  WBC 12.0* 10.2 9.8 8.9  HGB 14.7 14.7 14.4 13.5  HCT 43.9 44.5 44.4 42.9  MCV 100.0 102.3* 104.2* 104.9*  PLT 330 337 309 313   Cardiac Enzymes:  Recent Labs Lab 07/29/13 1820 07/29/13 2240 07/30/13 0345  TROPONINI <0.30 <0.30 <0.30   BNP (last 3 results)  Recent Labs  07/29/13 1138  PROBNP 26344.0*   CBG: No results found for this basename: GLUCAP,  in the last 168 hours  Recent Results (from the past 240 hour(s))  MRSA PCR SCREENING     Status: None   Collection Time    07/29/13  3:59 PM      Result Value Range Status   MRSA by PCR NEGATIVE   NEGATIVE Final   Comment:            The GeneXpert MRSA Assay (FDA     approved for NASAL specimens     only), is one component of a     comprehensive MRSA colonization     surveillance program. It is not     intended to diagnose MRSA     infection nor to guide or     monitor treatment for     MRSA infections.     Studies:  Recent x-ray studies have been reviewed in detail by the Attending Physician  Scheduled Meds:  Scheduled Meds: . bisoprolol  2.5 mg Oral Daily  . bisoprolol  2.5 mg Oral STAT  . folic acid  1 mg Oral Daily  . furosemide  40 mg Intravenous BID  . heparin  5,000 Units Subcutaneous Q8H  . levofloxacin  500 mg Oral Daily  . mometasone-formoterol  2 puff Inhalation BID  . multivitamin with minerals  1 tablet Oral Daily  . tiotropium  18 mcg Inhalation Daily   Continuous Infusions:   Time spent on care of this patient: 35 min   Calvert Cantor, MD  [facility] Office  [phone] Pager - Text Page per Loretha Stapler as per below:  On-Call/Text Page:      [email or web address]      password [email or web address]  If 7PM-7AM, please contact night-coverage [email or web address] Password [email or web address] 08/04/2013, 4:56 PM   LOS: 6 days

## 2013-08-04 NOTE — Progress Notes (Signed)
3 Days Post-Op Procedure(s) (LRB): LEFT AND RIGHT HEART CATHETERIZATION WITH CORONARY ANGIOGRAM (N/A) Subjective:  She says she has been able to move around the room without much dyspnea. She went down for cardiac CT last pm but got claustrophobic and it had to be stopped. She had it done successfully this afternoon. The reconstructions are pending but it does show ascending aortic enlargement to 4.5 cm.   Her PFT's are terrible with an FEV1 of 0.78.  Objective: Vital signs in last 24 hours: Temp:  [97.2 F (36.2 C)-98.7 F (37.1 C)] 98.2 F (36.8 C) (12/11 1105) Pulse Rate:  [64-94] 79 (12/11 1135) Cardiac Rhythm:  [-] Normal sinus rhythm (12/11 1105) Resp:  [15-27] 22 (12/11 1135) BP: (81-116)/(42-71) 83/46 mmHg (12/11 1135) SpO2:  [92 %-100 %] 99 % (12/11 1135) Weight:  [181 lb (82.1 kg)] 181 lb (82.1 kg) (12/11 0437)  Hemodynamic parameters for last 24 hours:    Intake/Output from previous day: 12/10 0701 - 12/11 0700 In: 540 [P.O.:440; IV Piggyback:100] Out: 1170 [Urine:1170] Intake/Output this shift: Total I/O In: 240 [P.O.:240] Out: 1551 [Urine:1550; Stool:1]  General appearance: alert, cooperative and sitting upright in bed Heart: regular rate and rhythm and systolic murmur RSB Lungs: diminished breath sounds bilaterally Extremities: extremities normal, atraumatic, no cyanosis or edema and no edema, redness or tenderness in the calves or thighs  Lab Results:  Recent Labs  08/01/13 1701  WBC 8.9  HGB 13.5  HCT 42.9  PLT 313   BMET:  Recent Labs  08/01/13 1701 08/03/13 0355  NA  --  136  K  --  4.1  CL  --  91*  CO2  --  38*  GLUCOSE  --  139*  BUN  --  17  CREATININE 0.99 0.96  CALCIUM  --  8.7    PT/INR: No results found for this basename: LABPROT, INR,  in the last 72 hours ABG    Component Value Date/Time   PHART 7.365 08/01/2013 1423   HCO3 36.6* 08/01/2013 1423   TCO2 39 08/01/2013 1423   O2SAT 94.0 08/01/2013 1423   CBG (last 3)  No  results found for this basename: GLUCAP,  in the last 72 hours  FVC-Pre L  1.32 P  FVC-%Pred-Pre %  47 P  FVC-Post L  1.32 P  FVC-%Pred-Post %  47 P  FVC-%Change-Post %  0 P  FEV1-Pre L  0.78 P  FEV1-%Pred-Pre %  36 P  FEV1-Post L  0.80 P  FEV1-%Pred-Post %  37 P  FEV1-%Change-Post %  2 P  FEV6-Pre L  1.32 P  FEV6-%Pred-Pre %  49 P  FEV6-Post L  1.31 P  FEV6-%Pred-Post %  48 P  FEV6-%Change-Post %  0 P  Pre FEV1/FVC ratio %  59 P  FEV1FVC-%Pred-Pre %  75 P  Post FEV1/FVC ratio %  61 P  FEV1FVC-%Change-Post %  2 P  Pre FEV6/FVC Ratio %  100 P  FEV6FVC-%Pred-Pre %  104 P  Post FEV6/FVC ratio %  99 P  FEV6FVC-%Pred-Post %  102 P  FEV6FVC-%Change-Post %  -1 P  FEF 25-75 Pre L/sec  0.38 P  FEF2575-%Pred-Pre %  18 P  FEF 25-75 Post L/sec  0.39 P  FEF2575-%Pred-Post %  19 P  FEF2575-%Change-Post %  2 P     Assessment/Plan:  1. Critical aortic stenosis - I think she would be at extremely high risk for open surgical AVR and even higher risk for [place] due to low EF,  severe COPD with PCO2 60, pulmonary HTN, morbid obesity. She does have a 4.5 cm ascending aorta but that could be left alone because her long term prognosis for survival is low. Will await the final measurements and assessment of her cardiac CT. I think TAVR would be the best option if possible.  LOS: 6 days    BARTLE,BRYAN K 08/04/2013

## 2013-08-05 ENCOUNTER — Inpatient Hospital Stay (HOSPITAL_COMMUNITY): Payer: BC Managed Care – PPO

## 2013-08-05 DIAGNOSIS — J449 Chronic obstructive pulmonary disease, unspecified: Secondary | ICD-10-CM

## 2013-08-05 DIAGNOSIS — I359 Nonrheumatic aortic valve disorder, unspecified: Secondary | ICD-10-CM

## 2013-08-05 LAB — BASIC METABOLIC PANEL
CO2: 37 mEq/L — ABNORMAL HIGH (ref 19–32)
Calcium: 9.1 mg/dL (ref 8.4–10.5)
Creatinine, Ser: 1.1 mg/dL (ref 0.50–1.10)
GFR calc non Af Amer: 53 mL/min — ABNORMAL LOW (ref >90)
Glucose, Bld: 136 mg/dL — ABNORMAL HIGH (ref 70–99)

## 2013-08-05 MED ORDER — FUROSEMIDE 40 MG PO TABS
40.0000 mg | ORAL_TABLET | Freq: Every day | ORAL | Status: DC
  Administered 2013-08-05 – 2013-08-08 (×4): 40 mg via ORAL
  Filled 2013-08-05 (×5): qty 1

## 2013-08-05 MED ORDER — SENNOSIDES-DOCUSATE SODIUM 8.6-50 MG PO TABS
1.0000 | ORAL_TABLET | Freq: Two times a day (BID) | ORAL | Status: DC
  Filled 2013-08-05: qty 1

## 2013-08-05 MED ORDER — BISACODYL 10 MG RE SUPP: 10.0000 mg | Freq: Every day | RECTAL | Status: DC | PRN

## 2013-08-05 MED ORDER — IOHEXOL 350 MG/ML SOLN
100.0000 mL | Freq: Once | INTRAVENOUS | Status: AC | PRN
  Administered 2013-08-05: 100 mL via INTRAVENOUS

## 2013-08-05 MED ORDER — POLYETHYLENE GLYCOL 3350 17 G PO PACK
17.0000 g | PACK | Freq: Every day | ORAL | Status: DC
  Filled 2013-08-05 (×5): qty 1

## 2013-08-05 NOTE — Evaluation (Signed)
Physical Therapy Evaluation Patient Details Name: Cathy Moody MRN: [id number] DOB: 1950-10-31 Today's Date: 08/05/2013 Time: 6213-0865 PT Time Calculation (min): 59 min  PT Assessment / Plan / Recommendation History of Present Illness  Pt admit with CHF. Also with COPD.  Plan for TAVR on 12/16.  Clinical Impression  Pt admitted with above. Pt currently with functional limitations due to the deficits listed below (see PT Problem List). Pt will benefit from continued therapy up to surgery as pt presents with poor endurance.  Testing per walk test, 6 min walk test and Frailty test below for baseline results prior to TAVR.   Pt will benefit from skilled PT to increase their independence and safety with mobility to allow discharge to the venue listed below.    PT Assessment  Patient needs continued PT services    Follow Up Recommendations  Other (comment) (TBA after surgery)        Barriers to Discharge        Equipment Recommendations  Cane;Other (comment) (Pt states she wants cane but TBA after surgery)         Frequency Min 3X/week    Precautions / Restrictions Precautions Precautions: Fall Restrictions Weight Bearing Restrictions: No   Pertinent Vitals/Pain 102/63, 79 bpm, 86% on RA therefore left 4LO2 on with sat 94% and >; No pain      Mobility  Bed Mobility Bed Mobility: Supine to Sit Supine to Sit: 7: Independent Transfers Transfers: Sit to Stand;Stand to Sit Sit to Stand: 4: Min assist;With upper extremity assist;From bed Stand to Sit: 4: Min assist;With upper extremity assist;To chair/3-in-1;With armrests Details for Transfer Assistance: Pt needed steadying assist as pt stated she is dizzy when she stands up. Ambulation/Gait Ambulation/Gait Assistance: 4: Min assist Ambulation Distance (Feet): 338 Feet Assistive device: Rolling walker Ambulation/Gait Assistance Details: Pt ambulated some without RW.  Needed frequent standing rest breaks.  Also had to  place O2 on as pt desat to 87% on RA.  Performed 5 meter walk test with results as follows:  12 seconds (.416 meters/second), 12.3 seconds (.406 meters/second), and 11.8 seconds (.424 meters/second) which is a gait velocity of .415 meters/second.    Had to use 4LO2.    6 min walk test:         BP         HR         O2(%RA)         Modified Borg Scale for Dyspnea      Perceived Rate of Exertion  Baseline:                 102/63    79bpm      86%                            2/10                                                8/20 Post test                   97/46     83 bpm     96 4L                          2/10  12/20 Pt ambulated 338 feet during 6 min walk test.  Had to use 4LO2.  Frailty index: 4  Gait Pattern: Step-through pattern;Decreased stride length;Wide base of support;Trunk flexed Gait velocity: 1.33 feet/second with pt at household ambulation level.  Stairs: No Wheelchair Mobility Wheelchair Mobility: No         PT Diagnosis: Generalized weakness  PT Problem List: Decreased balance;Decreased activity tolerance;Decreased mobility;Decreased knowledge of use of DME;Decreased safety awareness;Decreased knowledge of precautions;Cardiopulmonary status limiting activity PT Treatment Interventions: DME instruction;Gait training;Functional mobility training;Therapeutic activities;Therapeutic exercise;Balance training;Patient/family education     PT Goals(Current goals can be found in the care plan section) Acute Rehab PT Goals Patient Stated Goal: to go home PT Goal Formulation: With patient Time For Goal Achievement: 08/19/13 Potential to Achieve Goals: Good  Visit Information  Last PT Received On: 08/05/13 Assistance Needed: +2 History of Present Illness: Pt admit with CHF. Also with COPD.  Plan for TAVR on 12/16.       Prior Functioning  Home Living Family/patient expects to be discharged to:: Private residence Living  Arrangements: Spouse/significant other Available Help at Discharge: Family;Available 24 hours/day Type of Home: House Home Access: Stairs to enter [employer] of Steps: 2 Entrance Stairs-Rails: Can reach both Home Layout: One level Home Equipment:  (home O2) Additional Comments: Pt reports she kept her 57 and 28 year old grandkids  Prior Function Level of Independence: Independent Communication Communication: No difficulties    Cognition  Cognition Arousal/Alertness: Awake/alert Behavior During Therapy: WFL for tasks assessed/performed Overall Cognitive Status: Within Functional Limits for tasks assessed    Extremity/Trunk Assessment Upper Extremity Assessment Upper Extremity Assessment: Defer to OT evaluation Lower Extremity Assessment Lower Extremity Assessment: Generalized weakness   Balance Balance Balance Assessed: Yes Static Standing Balance Static Standing - Balance Support: Bilateral upper extremity supported;During functional activity Static Standing - Level of Assistance: 4: Min assist Static Standing - Comment/# of Minutes: 2 High Level Balance High Level Balance Activites: Turns;Direction changes High Level Balance Comments: Needed min assist for these activities as pt fatigues easily.    End of Session PT - End of Session Equipment Utilized During Treatment: Gait belt;Oxygen Activity Tolerance: Patient limited by fatigue Patient left: in chair;with call bell/phone within reach;with family/visitor present Nurse Communication: Mobility status       [name],[name] 08/05/2013, 3:16 PM [facility] Acute Rehabilitation [phone] (pager)

## 2013-08-05 NOTE — Progress Notes (Signed)
    Subjective:  Feeling a little better. Still with cough and shortness of breath. No chest pain. Edema has resolved.   Objective:  Vital Signs in the last 24 hours: Temp:  [98 F (36.7 C)-98.2 F (36.8 C)] 98 F (36.7 C) (12/12 0816) Pulse Rate:  [71-81] 77 (12/12 0816) Resp:  [15-86] 86 (12/12 0816) BP: (81-115)/(34-60) 93/44 mmHg (12/12 0816) SpO2:  [96 %-100 %] 96 % (12/12 0816) Weight:  [181 lb 3.5 oz (82.2 kg)] 181 lb 3.5 oz (82.2 kg) (12/12 0352)  Intake/Output from previous day: 12/11 0701 - 12/12 0700 In: 1080 [P.O.:1080] Out: 2301 [Urine:2300; Stool:1]  Physical Exam: Pt is alert and oriented, NAD HEENT: normal Neck: JVP - normal, carotids 2+= without bruits Lungs: CTA bilaterally CV: RRR without murmur or gallop Abd: soft, NT, Positive BS, no hepatomegaly Ext: no C/C/E, distal pulses intact and equal Skin: warm/dry no rash   Lab Results: No results found for this basename: WBC, HGB, PLT,  in the last 72 hours  Recent Labs  08/03/13 0355 08/05/13 0529  NA 136 136  K 4.1 3.9  CL 91* 91*  CO2 38* 37*  GLUCOSE 139* 136*  BUN 17 22  CREATININE 0.96 1.10   No results found for this basename: TROPONINI, CK, MB,  in the last 72 hours  Cardiac Studies: Gated Cardiac CTA reviewed - see under images section.  Tele: Sinus rhythm  Assessment/Plan:  1. Acute systolic HF - LVEF less than 30% 2. Severe aortic stenosis  3. Acute respiratory failure  4. Severe COPD with ongoing tobacco use  5. Morbid obesity  The patient's blood pressure has been running low. I think she is ready to transition to oral Lasix. I discussed the findings of the gated cardiac CTA with the patient and her family this morning. She appears to have a tricuspid aortic valve with severe leaflet calcification. The aortic root is moderately dilated at 4.5 cm. Her valve annulus appears suitable for a 29 mm Sapien valve. I will order a CTA of the chest, abdomen, and pelvis to assess her  vascular access for transcatheter aortic valve replacement. Will discuss further with Dr. Laneta Simmers, but she does appear to be a reasonable candidate for TAVR. Because of her tenuous clinical situation with severe LV dysfunction, critical aortic stenosis, hypotension, and severe lung disease, I think is best to proceed with treatment while she is hospitalized rather than sending her home before surgery.  Tonny Bollman, M.D. 08/05/2013, 10:23 AM

## 2013-08-05 NOTE — Progress Notes (Signed)
Already seen today by [facility].

## 2013-08-05 NOTE — Progress Notes (Signed)
[facility] Progress Note [place] TEAM 1 - Stepdown/ICU TEAM   TANAIYA KOLARIK JYN:[id number] DOB: 1951/04/06 DOA: 07/29/2013 PCP: Ernst Breach, PA-C  Brief narrative: 62 y.o. female with PMH of asthma, alcoholism, tobacco use presented with SOB, progressive DOE, PND, and orthopnea, associated with LE edema and was subsequently found to be in acute CHF. Per patient, she had not seen a doctor in over a year.   HPI/Subjective: The patient is now able to walk the hallway with assistance without marked difficulty.  She denies chest pain fevers chills nausea or vomiting.  She is anxious to proceed with her valve replacement surgery.  Assessment/Plan:  Acute systolic CHF with severe AoS and mod to severe pulm HTN - Cardiology and TCTS following - TAVR being considered as her pulmonary function places her a high risk for open heart approach  - diuresing adequately and now > 6 L in negative balance - Cath negative for significant CAD  COPD, severe - cont current treatment - well compensated at present  Tobacco abuse - Strongly advised to quit  Anxiety state, unspecified - cont Ativan PRN  Elevated TSH - Free T4 normal - likely sick euthyroid syndrome  UTI - Levaquin x 5 days  ETOH abuse - no signs of withdrawal  Macrocytosis without anemia Likely due to direct toxic effects of alcohol - check B12 and folic acid  Hyperglycemia Multiple serum glucose measurements have been elevated - check hemoglobin A1c  Obesity  Code Status: FULL Family Communication: Discussed care with patient and mother at bedside Disposition Plan: SDU  Consultants: Cardiology TCTS  Procedures: 12/8 cardiac cath - left and right heart - widely patent coronary arteries with no evidence of significant CAD - moderate to severe pulmonary hypertension  Antibiotics: Levaquin 12/08>>12/12  DVT prophylaxis: SQ Heparin  Objective: Blood pressure 111/97, pulse 80, temperature 97.7 F  (36.5 C), temperature source Oral, resp. rate 35, height 5' (1.524 m), weight 82.2 kg (181 lb 3.5 oz), SpO2 97.00%.  Intake/Output Summary (Last 24 hours) at 08/05/13 1347 Last data filed at 08/05/13 1200  Gross per 24 hour  Intake    840 ml  Output   1100 ml  Net   -260 ml   Exam: General: No acute respiratory distress at rest Lungs: Clear to auscultation bilaterally without wheezes or crackles Cardiovascular: Regular rate and rhythm - 2/6 holosystolic murmur Abdomen: Nontender, nondistended, soft, bowel sounds positive, no rebound, no ascites, no appreciable mass Extremities: No significant cyanosis, clubbing, mild pitting edema  Data Reviewed: Basic Metabolic Panel:  Recent Labs Lab 07/30/13 0345 07/31/13 0549 08/01/13 0448 08/01/13 1701 08/03/13 0355 08/05/13 0529  NA 138 139 137  --  136 136  K 3.9 4.7 4.2  --  4.1 3.9  CL 95* 94* 93*  --  91* 91*  CO2 33* 38* 39*  --  38* 37*  GLUCOSE 113* 119* 117*  --  139* 136*  BUN 10 12 19   --  17 22  CREATININE 0.90 0.90 1.31* 0.99 0.96 1.10  CALCIUM 8.8 8.7 8.8  --  8.7 9.1   Liver Function Tests:  Recent Labs Lab 07/30/13 0345  AST 20  ALT 17  ALKPHOS 70  BILITOT 0.7  PROT 6.8  ALBUMIN 3.3*   CBC:  Recent Labs Lab 07/29/13 1825 07/30/13 0345 08/01/13 1701  WBC 10.2 9.8 8.9  HGB 14.7 14.4 13.5  HCT 44.5 44.4 42.9  MCV 102.3* 104.2* 104.9*  PLT 337 309 313  Cardiac Enzymes:  Recent Labs Lab 07/29/13 1820 07/29/13 2240 07/30/13 0345  TROPONINI <0.30 <0.30 <0.30   BNP (last 3 results)  Recent Labs  07/29/13 1138  PROBNP 26344.0*    Recent Results (from the past 240 hour(s))  MRSA PCR SCREENING     Status: None   Collection Time    07/29/13  3:59 PM      Result Value Range Status   MRSA by PCR NEGATIVE  NEGATIVE Final   Comment:            The GeneXpert MRSA Assay (FDA     approved for NASAL specimens     only), is one component of a     comprehensive MRSA colonization      surveillance program. It is not     intended to diagnose MRSA     infection nor to guide or     monitor treatment for     MRSA infections.     Studies:  Recent x-ray studies have been reviewed in detail by the Attending Physician  Scheduled Meds:  Scheduled Meds: . bisoprolol  2.5 mg Oral Daily  . folic acid  1 mg Oral Daily  . furosemide  40 mg Oral Daily  . heparin  5,000 Units Subcutaneous Q8H  . levofloxacin  500 mg Oral Daily  . mometasone-formoterol  2 puff Inhalation BID  . multivitamin with minerals  1 tablet Oral Daily  . tiotropium  18 mcg Inhalation Daily    Time spent on care of this patient: 35 min   Keyshun Elpers T, MD  [facility] Office  [phone] Pager - Text Page per Loretha Stapler as per below:  On-Call/Text Page:      [email or web address]      password [email or web address]  If 7PM-7AM, please contact night-coverage [email or web address] Password [email or web address] 08/05/2013, 1:47 PM   LOS: 7 days

## 2013-08-06 LAB — VITAMIN B12: Vitamin B-12: 745 pg/mL (ref 211–911)

## 2013-08-06 LAB — COMPREHENSIVE METABOLIC PANEL
ALT: 163 U/L — ABNORMAL HIGH (ref 0–35)
Albumin: 3.1 g/dL — ABNORMAL LOW (ref 3.5–5.2)
Alkaline Phosphatase: 61 U/L (ref 39–117)
CO2: 36 mEq/L — ABNORMAL HIGH (ref 19–32)
Calcium: 9 mg/dL (ref 8.4–10.5)
GFR calc Af Amer: 58 mL/min — ABNORMAL LOW (ref >90)
GFR calc non Af Amer: 50 mL/min — ABNORMAL LOW (ref >90)
Glucose, Bld: 146 mg/dL — ABNORMAL HIGH (ref 70–99)
Potassium: 4.2 mEq/L (ref 3.5–5.1)
Sodium: 134 mEq/L — ABNORMAL LOW (ref 135–145)
Total Protein: 6.3 g/dL (ref 6.0–8.3)

## 2013-08-06 LAB — FOLATE: Folate: 18.8 ng/mL

## 2013-08-06 MED ORDER — OXYCODONE HCL 5 MG PO TABS: 5.0000 mg | ORAL_TABLET | ORAL | Status: DC | PRN

## 2013-08-06 NOTE — Progress Notes (Signed)
[facility] Progress Note [place] TEAM 1 - Stepdown/ICU TEAM   Cathy Moody ZOX:[id number] DOB: 06/25/1951 DOA: 07/29/2013 PCP: Ernst Breach, PA-C  Brief narrative: 62 y.o. female with PMH of asthma, alcoholism, tobacco use presented with SOB, progressive DOE, PND, and orthopnea, associated with LE edema and was subsequently found to be in acute CHF. Per patient, she had not seen a doctor in over a year.   HPI/Subjective: The patient has no new complaints today.  She states she is doing well.    Assessment/Plan:  Acute systolic CHF with severe AoS and mod to severe pulm HTN - Cardiology and TCTS following - TAVR being considered  - diuresing adequately and now > 6 L in negative balance - Cath negative for significant CAD  COPD, severe - cont current treatment - well compensated at present  Tobacco abuse - Strongly advised to quit  Anxiety state, unspecified - cont Ativan PRN  Elevated TSH - Free T4 normal c/w sick euthyroid syndrome  UTI - Levaquin x 5 days  ETOH abuse - no signs of withdrawal  Macrocytosis without anemia Likely due to direct toxic effects of alcohol - B12 and folic acid pending   Hyperglycemia Multiple serum glucose measurements have been elevated - A1c pending   Obesity  Code Status: FULL Family Communication: Discussed care with patient at bedside Disposition Plan: SDU  Consultants: Cardiology TCTS  Procedures: 12/8 cardiac cath - left and right heart - widely patent coronary arteries with no evidence of significant CAD - moderate to severe pulmonary hypertension  Antibiotics: Levaquin 12/08>>12/12  DVT prophylaxis: SQ Heparin  Objective: Blood pressure 94/54, pulse 70, temperature 97.3 F (36.3 C), temperature source Oral, resp. rate 23, height 5' (1.524 m), weight 82.6 kg (182 lb 1.6 oz), SpO2 98.00%.  Intake/Output Summary (Last 24 hours) at 08/06/13 1132 Last data filed at 08/06/13 1000  Gross per 24 hour   Intake    660 ml  Output    675 ml  Net    -15 ml   Exam: General: No acute respiratory distress at rest Lungs: Clear to auscultation bilaterally without wheezes or crackles Cardiovascular: Regular rate and rhythm - 2/6 holosystolic murmur Abdomen: Nontender, nondistended, soft, bowel sounds positive, no rebound, no ascites, no appreciable mass Extremities: No significant cyanosis, clubbing, mild pitting edema  Data Reviewed: Basic Metabolic Panel:  Recent Labs Lab 07/31/13 0549 08/01/13 0448 08/01/13 1701 08/03/13 0355 08/05/13 0529 08/06/13 0555  NA 139 137  --  136 136 134*  K 4.7 4.2  --  4.1 3.9 4.2  CL 94* 93*  --  91* 91* 89*  CO2 38* 39*  --  38* 37* 36*  GLUCOSE 119* 117*  --  139* 136* 146*  BUN 12 19  --  17 22 27*  CREATININE 0.90 1.31* 0.99 0.96 1.10 1.15*  CALCIUM 8.7 8.8  --  8.7 9.1 9.0   Liver Function Tests:  Recent Labs Lab 08/06/13 0555  AST 202*  ALT 163*  ALKPHOS 61  BILITOT 0.8  PROT 6.3  ALBUMIN 3.1*   CBC:  Recent Labs Lab 08/01/13 1701  WBC 8.9  HGB 13.5  HCT 42.9  MCV 104.9*  PLT 313    Recent Results (from the past 240 hour(s))  MRSA PCR SCREENING     Status: None   Collection Time    07/29/13  3:59 PM      Result Value Range Status   MRSA by PCR NEGATIVE  NEGATIVE  Final   Comment:            The GeneXpert MRSA Assay (FDA     approved for NASAL specimens     only), is one component of a     comprehensive MRSA colonization     surveillance program. It is not     intended to diagnose MRSA     infection nor to guide or     monitor treatment for     MRSA infections.     Studies:  Recent x-ray studies have been reviewed in detail by the Attending Physician  Scheduled Meds:  Scheduled Meds: . bisoprolol  2.5 mg Oral Daily  . folic acid  1 mg Oral Daily  . furosemide  40 mg Oral Daily  . heparin  5,000 Units Subcutaneous Q8H  . mometasone-formoterol  2 puff Inhalation BID  . multivitamin with minerals  1  tablet Oral Daily  . polyethylene glycol  17 g Oral Daily  . senna-docusate  1 tablet Oral BID  . tiotropium  18 mcg Inhalation Daily    Time spent on care of this patient: 25 min   MCCLUNG,JEFFREY T, MD  [facility] Office  [phone] Pager - Text Page per Loretha Stapler as per below:  On-Call/Text Page:      [email or web address]      password [email or web address]  If 7PM-7AM, please contact night-coverage [email or web address] Password [email or web address] 08/06/2013, 11:32 AM   LOS: 8 days

## 2013-08-06 NOTE — Progress Notes (Signed)
CARDIAC REHAB PHASE I   PRE:  Rate/Rhythm: 80 SR PVCs  BP:  Supine:   Sitting: 109/39  Standing:    SaO2: 97%4L  MODE:  Ambulation: 270 ft   POST:  Rate/Rhythm: 96SR  BP:  Supine:   Sitting: 104/42  Standing:    SaO2: 96%RA 0945-1015 Pt walked 270 ft on 4L with rolling walker independently. Stopped once to rest. Wanted to get walk over with. C/o dizziness at beginning of walk but did not feel that she needed to sit. Stated dizziness got better with walk but legs felt tight. To recliner after walk with call bell. Denied SOB or CP with walk. Encouraged pt to walk with nurses also.   Cathy Nutting, RN BSN  08/06/2013 10:09 AM

## 2013-08-06 NOTE — Progress Notes (Signed)
Patient ID: Cathy Moody, female   DOB: 1951-07-20, 62 y.o.   MRN: [id number]      [address]       [place] [address]             [phone]        FATMATA LEGERE [facility] Medical Record #[id number] Date of Birth: November 28, 1950  Referring: Dr Excell Seltzer Primary Care: Ernst Breach, PA-C  Chief Complaint:    Chief Complaint  Patient presents with  . Tachycardia  . Shortness of Breath    History of Present Illness:     The patient is a 62 year old white female with a long history of heavy smoking, chronic shortness of breath for years probably related to COPD, who also has a long history of a heart murmur. She has noted   6 week history of worsening shortness of breath with exertion.  She is now sob just walking short distance in the house. She has also developed marked swelling in her legs. On admission her BNP was 29528. Initial Troponin I poc was .07 and subsequent follow-up levels were < 0.3. A 2 D echo showed severe calcific AS with an AVA of 0.61 cm2 by VTI and 0.49 cm2 by Vmax. The mean gradient was 47 mm Hg with a peak gradient of 80 mm Hg. She has had presyncope but no frank syncope.   Current Activity/ Functional Status: Patient is independent with mobility/ambulation, transfers, ADL's, IADL's.   Zubrod Score: At the time of surgery this patient's most appropriate activity status/level should be described as: []  Normal activity, no symptoms []  Symptoms, fully ambulatory [x]  Symptoms, in bed less than or equal to 50% of the time []  Symptoms, in bed greater than 50% of the time but less than 100% []  Bedridden []  Moribund  Past Medical History  Diagnosis Date  . Asthma   . Heart murmur   . Acute CHF     Cathy Moody 07/29/2013  . Alcohol abuse     /notes 07/29/2013  . Hypotension     History reviewed. No pertinent past surgical history.  History  Smoking status  . Current Some Day Smoker  . Types: Cigarettes  Smokeless tobacco  . Not on file    History  Alcohol Use  . Yes    Comment: Pt is weekend drinker    History   Social History  . Marital Status: Divorced    Spouse Name: N/A    Number of Children: N/A  . Years of Education: N/A   Occupational History  . Not on file.   Social History Main Topics  . Smoking status: Current Some Day Smoker    Types: Cigarettes  . Smokeless tobacco: Not on file  . Alcohol Use: Yes     Comment: Pt is weekend drinker  . Drug Use: No  . Sexual Activity: Not on file   Other Topics Concern  . Not on file   Social History Narrative  . No narrative on file    No Known Allergies  Current Facility-Administered Medications  Medication Dose Route Frequency Provider Last Rate Last Dose  . acetaminophen (TYLENOL) tablet 650 mg  650 mg Oral Q4H PRN Brittainy Simmons, PA-C   650 mg at 08/06/13 1128  . bisacodyl (DULCOLAX) suppository 10 mg  10 mg Rectal Daily PRN Lonia Blood, MD      . bisoprolol (ZEBETA) tablet 2.5 mg  2.5 mg Oral Daily Dolores Patty, MD  2.5 mg at 08/06/13 0910  . folic acid (FOLVITE) tablet 1 mg  1 mg Oral Daily Esperanza Sheets, MD   1 mg at 08/06/13 0910  . furosemide (LASIX) tablet 40 mg  40 mg Oral Daily Tonny Bollman, MD   40 mg at 08/06/13 0910  . guaiFENesin-dextromethorphan (ROBITUSSIN DM) 100-10 MG/5ML syrup 5 mL  5 mL Oral Q4H PRN Dolores Patty, MD   5 mL at 08/04/13 1330  . heparin injection 5,000 Units  5,000 Units Subcutaneous Q8H Lesleigh Noe, MD   5,000 Units at 08/06/13 1442  . levalbuterol (XOPENEX) nebulizer solution 0.63 mg  0.63 mg Nebulization Q6H PRN Everette Rank, MD   0.63 mg at 08/02/13 1403  . LORazepam (ATIVAN) tablet 0.5 mg  0.5 mg Oral Q6H PRN Christiane Ha, MD   0.5 mg at 08/06/13 1219  . metoprolol (LOPRESSOR) injection 5 mg  5 mg Intravenous Q5 min PRN Lars Masson, MD      . mometasone-formoterol [facility]) 100-5 MCG/ACT inhaler 2 puff  2 puff Inhalation BID Esperanza Sheets, MD   2 puff at 08/06/13 0844   . morphine 2 MG/ML injection 2 mg  2 mg Intravenous Q4H PRN Esperanza Sheets, MD      . multivitamin with minerals tablet 1 tablet  1 tablet Oral Daily Esperanza Sheets, MD   1 tablet at 08/05/13 1020  . MUSCLE RUB CREA   Topical PRN Calvert Cantor, MD      . ondansetron (ZOFRAN) injection 4 mg  4 mg Intravenous Q6H PRN Brittainy Simmons, PA-C      . oxyCODONE (Oxy IR/ROXICODONE) immediate release tablet 5 mg  5 mg Oral Q4H PRN Lonia Blood, MD      . polyethylene glycol (MIRALAX / GLYCOLAX) packet 17 g  17 g Oral Daily Lonia Blood, MD      . senna-docusate (Senokot-S) tablet 1 tablet  1 tablet Oral BID Lonia Blood, MD      . tiotropium [facility]) inhalation capsule 18 mcg  18 mcg Inhalation Daily Esperanza Sheets, MD   18 mcg at 08/06/13 0844  . zolpidem (AMBIEN) tablet 5 mg  5 mg Oral QHS PRN Everette Rank, MD   5 mg at 08/04/13 2209    Prescriptions prior to admission  Medication Sig Dispense Refill  . albuterol (PROVENTIL HFA;VENTOLIN HFA) 108 (90 BASE) MCG/ACT inhaler Inhale 2 puffs into the lungs every 6 (six) hours as needed for wheezing or shortness of breath.        Family History  Problem Relation Age of Onset  . Diabetes Paternal Grandmother      Review of Systems:     Cardiac Review of Systems: Y or N  Chest Pain [ y   ]  Resting SOB [ y  ] Exertional SOB  [  y]  Orthopnea Cove.Etienne  ]   Pedal Edema [  severe ]    Palpitations Milo.Brash  ] Syncope  [ n ]   Presyncope [ y  ]  General Review of Systems: [Y] = yes [  ]=no Constitional: recent weight change [  ]; anorexia [  ]; fatigue [  ]; nausea [  ]; night sweats [  ]; fever [  ]; or chills [  ]  Dental: poor dentition[  ]; Last Dentist visit:   Eye : blurred vision [  ]; diplopia [   ]; vision changes [  ];  Amaurosis fugax[  ]; Resp: cough [  ];  wheezing[ y ];  hemoptysis[ n ]; shortness of breath[y  ]; paroxysmal nocturnal dyspnea[ y ]; dyspnea on exertion[ y  ]; or orthopnea[ y ];  GI:  gallstones[  ], vomiting[  ];  dysphagia[  ]; melena[  ];  hematochezia [  ]; heartburn[  ];   Hx of  Colonoscopy[ n ]; GU: kidney stones [  ]; hematuria[  ];   dysuria [  ];  nocturia[  ];  history of     obstruction [  ]; urinary frequency [  ]             Skin: rash, swelling[  ];, hair loss[y  ];  peripheral edema[ y ];  or itching[  ]; Musculosketetal: myalgias[  ];  joint swelling[  ];  joint erythema[  ];  joint pain[  ];  back pain[  ];  Heme/Lymph: bruising[ y ];  bleeding[  ];  anemia[  ];  Neuro: TIA[  ];  headaches[  ];  stroke[n  ];  vertigo[  ];  seizures[  ];   paresthesias[  ];  difficulty walking[  ];  Psych:depression[  ]; anxiety[  ];  Endocrine: diabetes[  ];  thyroid dysfunction[y  ];  Immunizations: Flu Milo.Brash  ]; Pneumococcal[n  ];  Other:  Physical Exam: BP 93/47  Pulse 68  Temp(Src) 97.3 F (36.3 C) (Oral)  Resp 20  Ht 5' (1.524 m)  Wt 182 lb 1.6 oz (82.6 kg)  BMI 35.56 kg/m2  SpO2 96%  General appearance: alert, cooperative, appears older than stated age and mild distress Neurologic: intact Heart: systolic murmur: holosystolic 4/6, crescendo throughout the precordium Lungs: diminished breath sounds bilaterally Abdomen: soft, non-tender; bowel sounds normal; no masses,  no organomegaly Extremities: edema 3 + both feet, no palpalble pulses at ankles radiation murmur in carotids  Diagnostic Studies & Laboratory data:     Recent Radiology Findings:  Ct Coronary Morp W/cta Cor W/score W/ca W/cm &/or Wo/cm  08/05/2013   ADDENDUM REPORT: 08/05/2013 15:21  ADDENDUM: OVER-READ INTERPRETATION  CT CHEST  The following report is an over-read performed by radiologist Dr. Eliezer Mccoy [facility] on 08/05/2013. This over-read does not include interpretation of cardiac or coronary anatomy or pathology. The CTA interpretation by the cardiologist is attached.  FINDINGS:  Moderate centrilobular emphysematous changes. Mild  interstitial edema is suspected. Small right and trace left pleural effusions. Associated right middle/lower lobe atelectasis. No pneumothorax.  Cardiomegaly. Numerous mediastinal lymph nodes measuring up to 14 mm short axis (series 10856/image 12), likely reactive. No suspicious axillary lymphadenopathy.  Visualized upper abdomen is unremarkable.  No focal osseous lesions.  IMPRESSION:  Cardiomegaly with suspected mild interstitial edema. Small right and trace left pleural effusions.  Associated right middle/lower lobe atelectasis.  Underlying moderate centrilobular emphysematous changes.   Electronically Signed   By: Charline Bills M.D.   On: 08/05/2013 15:21   08/05/2013   ADDENDUM REPORT: 08/05/2013 09:28  ADDENDUM: The optimal deployment angle is LAO 10 Cranial 0.   Electronically Signed   By: Tobias Alexander   On: 08/05/2013 09:28   08/05/2013   CLINICAL DATA:  AORTIC STENOSIS  EXAM: Cardiac TAVR CT  TECHNIQUE: The patient was scanned on a Philips 256 scanner. A 120 kV retrospective scan  was triggered in the descending thoracic aorta at 111 HU's. Gantry rotation speed was 270 msecs and collimation was .9 mm. No beta blockade or nitro were given. The 3D data set was reconstructed in 10 % intervals of the R-R cycle. Systolic and diastolic phases were analyzed on a dedicated work station using MPR, MIP and VRT modes. The patient received 80 cc of contrast.  FINDINGS: Aortic Valve: Aortic valve is severely calcified especially the right and non-coronary cusp. Subvalvular apparatus and ascending aorta are free of calcifications.  Aorta: Ascending aorta is dilated and has no calcifications. Aortic arch has mild calcifications at the origin of the right brachiocephalic, left carotid and left subclavian arteries. No calcifications in the descending aorta.  Sinotubular Junction:  31 x 32 mm.  Ascending Thoracic Aorta: Ascending aorta is moderately dilated with maximum diameter 45 x 45 mm at the level of right  pulmonary artery.  Aortic Arch:  25 x 24 mm  Descending Thoracic Aorta:  27 x 25 mm  Sinus of Valsalva Measurements:  Non-coronary:  34 mm  Right -coronary:  30 mm  Left -coronary:  34 mm  Coronary Artery Height above Annulus:  Left Main:  12.0 mm  Right Coronary:  15.6 mm  Virtual Basal Annulus Measurements:  Maximum/Minimum Diameter:  26.6 / 28.1 mm  Perimeter:  86 mm  Area:  578 mm2  This measurements would fit with 29 mm Edwards Sapien valve.  Coronary Arteries: Right dominance. Right and left coronary artery originate normally from right and left coronary sinus. There is mild calcified plague in the proximal RCA and LAD associated with 0-25% stenosis.  Optimum Fluoroscopic Angle for Delivery:  Needs to be determined.  IMPRESSION: 1. Tricuspid aortic valve with severely calcified leaflets with restricted leaflet opening.  2. TAVR measurements amenable for 29 mm Edwards Sapien valve. Good distance of coronary vessels origins to the annulus.  3.  Dilated ascending aorta measuring 45 x 45 mm.  4.  Non-obstructive CAD.  Tobias Alexander  Electronically Signed: By: Charlton Haws M.D. On: 08/04/2013 20:15   Dg Chest Port 1 View  07/29/2013   CLINICAL DATA:  Shortness of breath.  Tachycardia.  EXAM: PORTABLE CHEST - 1 VIEW  COMPARISON:  None.  FINDINGS: Severe cardiomegaly is present. Cardiac echo should be considered as the possibility of pericardial effusion, cardiomyopathy, and/or valvular disease should be considered in this patient. Mild pulmonary vascular prominence noted. Bilateral interstitial prominence with Kerley B-lines and bilateral alveolar prominence noted consistent with pulmonary edema. Right-sided pleural effusion is most likely present. No pneumothorax. No acute bony abnormality.  IMPRESSION: Findings consistent with congestive heart failure with pulmonary edema and right-sided pleural effusion. Cardiomegaly is severe. Cardiac ECHO may prove useful for further evaluation as the possibility of  pericardial effusion, cardiomyopathy, and/or valvular disease should be considered in this patient.   Electronically Signed   By: Maisie Fus  Register   On: 07/29/2013 11:38    Recent Lab Findings: Lab Results  Component Value Date   WBC 8.9 08/01/2013   HGB 13.5 08/01/2013   HCT 42.9 08/01/2013   PLT 313 08/01/2013   GLUCOSE 146* 08/06/2013   ALT 163* 08/06/2013   AST 202* 08/06/2013   NA 134* 08/06/2013   K 4.2 08/06/2013   CL 89* 08/06/2013   CREATININE 1.15* 08/06/2013   BUN 27* 08/06/2013   CO2 36* 08/06/2013   TSH 7.306* 07/29/2013   INR 1.08 08/01/2013   HGBA1C 6.2* 08/06/2013   CATH: Procedure Date:  08/01/2013  Referring Physician: J.Varanasi, MD  Primary Cardiologist:: Catalina Gravel  INDICATIONS: Critical aortic stenosis (by echo) with severe left ventricular systolic dysfunction(LVEF less than 30% by echo) the  PROCEDURE: 1. Left heart catheterization; 2. Coronary angiography; 3. Right heart catheterization  CONSENT:  The risks, benefits, and details of the procedure were explained in detail to the patient. Risks including death, stroke, heart attack, kidney injury, allergy, limb ischemia, bleeding and radiation injury were discussed. The patient verbalized understanding and wanted to proceed. Informed written consent was obtained.  PROCEDURE TECHNIQUE: After Xylocaine anesthesia a 5 French sheath was placed in the right femoral artery with after multiple sticks. The right femoral vein was entered after multiple passes and a 7 French sheath inserted using the modified Seldinger technique. We did not attempt to cross the aortic valve. Coronary angiography was done using a 5 [place] JL 4 and JR 4 catheters. Left ventriculography was not done.  No sedation was administered because of significant lung disease and tendency towards hypoxia  CONTRAST: Total of 60 cc.  COMPLICATIONS: None  HEMODYNAMICS: Aortic pressure 99/58 mmHg; LV pressure (we did not attempt to cross the aortic valve);  LVEDP not obtained, RA 14 mm mercury, RV 65/14 mmHg, PA 65/31 mm mercury, PCWP(mean) mean of 31 mm mercury, Cardiac Output 3.07 L per minute by Fick determination.  ANGIOGRAPHIC DATA: The left main coronary artery is normal.  The left anterior descending artery is widely patent giving origin to a large diagonal which is also widely patent..  The left circumflex artery is 2 obtuse marginal branches with the first a trifurcating vessel. Widely patent..  The right coronary artery is dominant and widely patent.Marland Kitchen  LEFT VENTRICULOGRAM: Left ventricular angiogram was done in the 30 RAO projection and revealed not performed as the aortic valve was not retrogradely crossed.  IMPRESSIONS: 1. Widely patent coronary arteries with no evidence of significant CAD  2. Moderate to severe pulmonary hypertension  3. Known severe left ventricular systolic dysfunction, EF less than 30% by echo  4. Critical aortic stenosis based upon echo findings  RECOMMENDATION: Referral to heart valve team. Phoned in to [facility].       ECHO: Transthoracic Echocardiography  Patient: Eris, Hannan MR #: [id number] Study Date: 07/29/2013 Gender: F Age: 36 Height: Weight: BSA: Pt. Status: Room: [facility]  PERFORMING [facility], Echo ORDERING Everette Rank, MD SONOGRAPHER Georgian Co, RDCS, CCT ADMITTING [place] Spaniel, [place] cc:  ------------------------------------------------------------ LV EF: 25% - 30%  ------------------------------------------------------------ Indications: CHF - 428.0.  ------------------------------------------------------------ History: PMH: Dyspnea.  ------------------------------------------------------------ Study Conclusions  - Left ventricle: The cavity size was normal. Systolic function was severely reduced. The estimated ejection fraction was in the range of 25% to 30%. Diffuse hypokinesis. - Aortic valve: Transvalvular velocity was increased. There was severe stenosis. Mild  regurgitation. Valve area: 0.61cm^2(VTI). Valve area: 0.49cm^2 (Vmax). - Mitral valve: Calcified annulus. Mild to moderate regurgitation. - Left atrium: The atrium was mildly to moderately dilated. - Right ventricle: The cavity size was mildly dilated. Wall thickness was normal. Systolic function was mildly reduced. - Right atrium: The atrium was mildly dilated. - Pulmonary arteries: Systolic pressure was moderately to severely increased. PA peak pressure: 64mm Hg (S). Transthoracic echocardiography. M-mode, complete 2D, spectral Doppler, and color Doppler. Patient status: Inpatient. Location: ICU/CCU  ------------------------------------------------------------  ------------------------------------------------------------ Left ventricle: The cavity size was normal. Systolic function was severely reduced. The estimated ejection fraction was in the range of 25% to 30%. Diffuse hypokinesis.  ------------------------------------------------------------ Aortic valve: Trileaflet; severely  calcified leaflets. Mobility was not restricted. Doppler: Transvalvular velocity was increased. There was severe stenosis. Mild regurgitation. Valve area: 0.61cm^2(VTI). Peak velocity ratio of LVOT to aortic valve: 0.17. Valve area: 0.49cm^2 (Vmax). Mean gradient: 47mm Hg (S). Peak gradient: 80mm Hg (S).  ------------------------------------------------------------ Aorta: Aortic root: The aortic root was normal in size.  ------------------------------------------------------------ Mitral valve: Calcified annulus. Mobility was not restricted. Doppler: Transvalvular velocity was within the normal range. There was no evidence for stenosis. Mild to moderate regurgitation. Peak gradient: 5mm Hg (D).  ------------------------------------------------------------ Left atrium: The atrium was mildly to moderately dilated.  ------------------------------------------------------------ Atrial septum:  Poorly visualized.  ------------------------------------------------------------ Right ventricle: The cavity size was mildly dilated. Wall thickness was normal. Systolic function was mildly reduced.  ------------------------------------------------------------ Pulmonic valve: Poorly visualized. Doppler: Transvalvular velocity was within the normal range. There was no evidence for stenosis.  ------------------------------------------------------------ Tricuspid valve: Structurally normal valve. Doppler: Transvalvular velocity was within the normal range. Mild regurgitation.  ------------------------------------------------------------ Pulmonary artery: Poorly visualized. The main pulmonary artery was normal-sized. Systolic pressure was moderately to severely increased.  ------------------------------------------------------------ Right atrium: The atrium was mildly dilated.  ------------------------------------------------------------ Pericardium: There was no pericardial effusion.  ------------------------------------------------------------ Systemic veins: Inferior vena cava: The vessel was dilated; the respirophasic diameter changes were blunted (< 50%); findings are consistent with elevated central venous pressure.  ------------------------------------------------------------  2D measurements Normal Doppler measurements Normal Left ventricle Main pulmonary LVID ED, 53.9 mm 43-52 artery chord, PLAX Pressure, S 64 mm =30 LVID ES, 49.2 mm 23-38 Hg chord, PLAX LVOT FS, chord, 9 % >29 Peak vel, S 77 cm/s ------ PLAX Aortic valve LVPW, ED 12.2 mm ------ Peak vel, S 446 cm/s ------ IVS/LVPW 0.94 <1.3 Mean vel, S 320 cm/s ------ ratio, ED VTI, S 88. cm ------ Vol ED, MOD1 173 ml ------ 5 Vol ED, MOD2 172 ml ------ Mean 47 mm ------ Ventricular septum gradient, S Hg IVS, ED 11.5 mm ------ Peak 80 mm ------ LVOT gradient, S Hg Diam, S 19 mm ------ Area, VTI 0.6 cm^2  ------ Area 2.84 cm^2 ------ 1 Aorta Peak vel 0.1 ------ Root diam, 30 mm ------ ratio, 7 ED LVOT/AV Left atrium Area, Vmax 0.4 cm^2 ------ AP dim 49 mm ------ 9 Vol, S 81.5 ml ------ Regurg PHT 148 ms ------ Mitral valve Peak E vel 116 cm/s ------ Peak A vel 69 cm/s ------ Deceleration 148 ms 150-23 time 0 Peak 5 mm ------ gradient, D Hg Peak E/A 1.7 ------ ratio Regurg alias 38. cm/s ------ vel, PISA 5 Max regurg 568 cm/s ------ vel Regurg VTI 155 cm ------ ERO, PISA 0.0 cm^2 ------ 7 Regurg vol, 11 ml ------ PISA Tricuspid valve Regurg peak 369 cm/s ------ vel Peak RV-RA 54 mm ------ gradient, S Hg Max regurg 369 cm/s ------ vel Systemic veins Estimated CVP 10 mm ------ Hg Right ventricle Pressure, S 64 mm <30 Hg  ------------------------------------------------------------ Prepared and Electronically Authenticated by  Everette Rank, MD 2014-12-05T18:24:[id number]     Assessment / Plan:    1 critical AS with severe LV dysfunction,  2 severe pulmonary hypertension  3  severe COPD with a PCO2 of 64 at cath.   4 morbidly obese  With the patients severe pulmonary disease her risk for conventional cardiac surgery/ AVR poss root enlargement  would be 10% or greater, and high risk of need for  prolonged postop ventilation. Agree with pursuing option with [facility] .    Delight Ovens MD      [address] [place] [place] [address]  Office [phone]   Beeper [phone]  08/06/2013 7:22 PM

## 2013-08-06 NOTE — Progress Notes (Signed)
Subjective:  Walking hallway with cardiac rehab. Mild cough. No CP.   Objective:  Vital Signs in the last 24 hours: Temp:  [97.5 F (36.4 C)-97.8 F (36.6 C)] 97.5 F (36.4 C) (12/13 0722) Pulse Rate:  [68-83] 68 (12/13 0722) Resp:  [16-35] 17 (12/13 0722) BP: (80-111)/(48-97) 88/64 mmHg (12/13 0722) SpO2:  [91 %-100 %] 95 % (12/13 0845) Weight:  [182 lb 1.6 oz (82.6 kg)] 182 lb 1.6 oz (82.6 kg) (12/13 0722)  Intake/Output from previous day: 12/12 0701 - 12/13 0700 In: 780 [P.O.:780] Out: 775 [Urine:775]   Physical Exam: General: Looks older than stated age, in no acute distress. Head:  Normocephalic and atraumatic. Lungs: Scattered wheeze. Heart: Normal S1 and S2.  3/6 SM RUSB with radiation to carotids. No gallops.  Abdomen: soft, non-tender, positive bowel sounds. Extremities: No clubbing or cyanosis. No edema. Neurologic: Alert and oriented x 3.    Lab Results: No results found for this basename: WBC, HGB, PLT,  in the last 72 hours  Recent Labs  08/05/13 0529 08/06/13 0555  NA 136 134*  K 3.9 4.2  CL 91* 89*  CO2 37* 36*  GLUCOSE 136* 146*  BUN 22 27*  CREATININE 1.10 1.15*    Hepatic Function Panel  Recent Labs  08/06/13 0555  PROT 6.3  ALBUMIN 3.1*  AST 202*  ALT 163*  ALKPHOS 61  BILITOT 0.8     Imaging: Ct Coronary Morp W/cta Cor W/score W/ca W/cm &/or Wo/cm  08/05/2013   ADDENDUM REPORT: 08/05/2013 15:21  ADDENDUM: OVER-READ INTERPRETATION  CT CHEST  The following report is an over-read performed by radiologist Dr. Eliezer Mccoy [facility] on 08/05/2013. This over-read does not include interpretation of cardiac or coronary anatomy or pathology. The CTA interpretation by the cardiologist is attached.  FINDINGS:  Moderate centrilobular emphysematous changes. Mild interstitial edema is suspected. Small right and trace left pleural effusions. Associated right middle/lower lobe atelectasis. No pneumothorax.  Cardiomegaly.  Numerous mediastinal lymph nodes measuring up to 14 mm short axis (series [id number]/image 12), likely reactive. No suspicious axillary lymphadenopathy.  Visualized upper abdomen is unremarkable.  No focal osseous lesions.  IMPRESSION:  Cardiomegaly with suspected mild interstitial edema. Small right and trace left pleural effusions.  Associated right middle/lower lobe atelectasis.  Underlying moderate centrilobular emphysematous changes.   Electronically Signed   By: Charline Bills M.D.   On: 08/05/2013 15:21   08/05/2013   ADDENDUM REPORT: 08/05/2013 09:28  ADDENDUM: The optimal deployment angle is LAO 10 Cranial 0.   Electronically Signed   By: Tobias Alexander   On: 08/05/2013 09:28   08/05/2013   CLINICAL DATA:  AORTIC STENOSIS  EXAM: Cardiac TAVR CT  TECHNIQUE: The patient was scanned on a Philips 256 scanner. A 120 kV retrospective scan was triggered in the descending thoracic aorta at 111 HU's. Gantry rotation speed was 270 msecs and collimation was .9 mm. No beta blockade or nitro were given. The 3D data set was reconstructed in 10 % intervals of the R-R cycle. Systolic and diastolic phases were analyzed on a dedicated work station using MPR, MIP and VRT modes. The patient received 80 cc of contrast.  FINDINGS: Aortic Valve: Aortic valve is severely calcified especially the right and non-coronary cusp. Subvalvular apparatus and ascending aorta are free of calcifications.  Aorta: Ascending aorta is dilated and has no calcifications. Aortic arch has mild calcifications at the origin of the right brachiocephalic, left carotid and left subclavian arteries. No  calcifications in the descending aorta.  Sinotubular Junction:  31 x 32 mm.  Ascending Thoracic Aorta: Ascending aorta is moderately dilated with maximum diameter 45 x 45 mm at the level of right pulmonary artery.  Aortic Arch:  25 x 24 mm  Descending Thoracic Aorta:  27 x 25 mm  Sinus of Valsalva Measurements:  Non-coronary:  34 mm  Right -coronary:  30  mm  Left -coronary:  34 mm  Coronary Artery Height above Annulus:  Left Main:  12.0 mm  Right Coronary:  15.6 mm  Virtual Basal Annulus Measurements:  Maximum/Minimum Diameter:  26.6 / 28.1 mm  Perimeter:  86 mm  Area:  578 mm2  This measurements would fit with 29 mm Edwards Sapien valve.  Coronary Arteries: Right dominance. Right and left coronary artery originate normally from right and left coronary sinus. There is mild calcified plague in the proximal RCA and LAD associated with 0-25% stenosis.  Optimum Fluoroscopic Angle for Delivery:  Needs to be determined.  IMPRESSION: 1. Tricuspid aortic valve with severely calcified leaflets with restricted leaflet opening.  2. TAVR measurements amenable for 29 mm Edwards Sapien valve. Good distance of coronary vessels origins to the annulus.  3.  Dilated ascending aorta measuring 45 x 45 mm.  4.  Non-obstructive CAD.  Tobias Alexander  Electronically Signed: By: Charlton Haws M.D. On: 08/04/2013 20:15     Telemetry: NSR, no adverse rhythms Personally viewed.   Cardiac Studies:  CT as above.   Assessment/Plan:  Principal Problem:   Acute CHF Active Problems:   COPD, severe   Alcohol abuse   CHF (congestive heart failure)   Tobacco abuse   Acute systolic heart failure   Aortic stenosis, severe   Anxiety state, unspecified   Pulmonary hypertension, moderate to severe   Elevated TSH   1) Acute systolic heart failure - EF <30%. BP has been low. Lasix now 40mg  PO QD. Low dose bisoprolol 2.5mg  QD. Unable to tolerate increased dose due to hypotension. Walked 250 ft with rehab.   2) Severe Aortic stenosis. - reviewed Dr. Excell Seltzer and Dr. Laneta Simmers notes. Potential TAVR. CT above. Potential for Tuesday. CT abd done.   3) Morbid obesity - weight loss  4) Severe COPD - tobacco cessation.   5) Aortic root dilitation - 4.5cm.   Watchful waiting today.   Cathy Moody 08/06/2013, 10:02 AM

## 2013-08-07 DIAGNOSIS — I2789 Other specified pulmonary heart diseases: Secondary | ICD-10-CM

## 2013-08-07 MED ORDER — HEPARIN SODIUM (PORCINE) 5000 UNIT/ML IJ SOLN: 5000.0000 [IU] | Freq: Three times a day (TID) | INTRAMUSCULAR | Status: DC

## 2013-08-07 MED ORDER — LEVALBUTEROL HCL 0.63 MG/3ML IN NEBU: 0.6300 mg | INHALATION_SOLUTION | RESPIRATORY_TRACT | Status: DC | PRN

## 2013-08-07 NOTE — Progress Notes (Signed)
      Subjective:  Wants to go outside for a few min. At baseline.    Objective:  Vital Signs in the last 24 hours: Temp:  [97.3 F (36.3 C)-97.9 F (36.6 C)] 97.5 F (36.4 C) (12/14 0800) Pulse Rate:  [68-80] 68 (12/13 1659) Resp:  [17-30] 30 (12/14 0600) BP: (83-99)/(44-70) 99/44 mmHg (12/14 0800) SpO2:  [95 %-98 %] 97 % (12/14 0800) FiO2 (%):  [36 %] 36 % (12/13 2126) Weight:  [186 lb 4.6 oz (84.5 kg)] 186 lb 4.6 oz (84.5 kg) (12/14 0510)  Intake/Output from previous day: 12/13 0701 - 12/14 0700 In: 720 [P.O.:720] Out: 750 [Urine:750]   Physical Exam: General: Looks older than stated age, in no acute distress. Head:  Normocephalic and atraumatic. Lungs: Scattered wheeze. Heart: Normal S1 and S2.  3/6 SM RUSB with radiation to carotids. No gallops.  Abdomen: soft, non-tender, positive bowel sounds. Extremities: No clubbing or cyanosis. No edema. Neurologic: Alert and oriented x 3.    Lab Results:   Recent Labs  08/05/13 0529 08/06/13 0555  NA 136 134*  K 3.9 4.2  CL 91* 89*  CO2 37* 36*  GLUCOSE 136* 146*  BUN 22 27*  CREATININE 1.10 1.15*    Hepatic Function Panel  Recent Labs  08/06/13 0555  PROT 6.3  ALBUMIN 3.1*  AST 202*  ALT 163*  ALKPHOS 61  BILITOT 0.8    Telemetry: NSR, no adverse rhythms Personally viewed.   Cardiac Studies:  CT as above.   Assessment/Plan:  Principal Problem:   Acute CHF Active Problems:   COPD, severe   Alcohol abuse   CHF (congestive heart failure)   Tobacco abuse   Acute systolic heart failure   Aortic stenosis, severe   Anxiety state, unspecified   Pulmonary hypertension, moderate to severe   Elevated TSH   1) Acute systolic heart failure - EF <30%. BP has been low. Lasix now 40mg  PO QD. Low dose bisoprolol 2.5mg  QD. Unable to tolerate increased dose due to hypotension. Walked 250 ft with rehab.   2) Severe Aortic stenosis. - reviewed Dr. Excell Seltzer and Dr. Laneta Simmers notes. Potential TAVR. CT above.  Potential for Tuesday. CT abd done. Reviewed Dr. Tyrone Sage note.   3) Morbid obesity - weight loss  4) Severe COPD - tobacco cessation.   5) Aortic root dilitation - 4.5cm.   Watchful waiting today. Can not go outside.   Lanecia Sliva 08/07/2013, 8:35 AM

## 2013-08-07 NOTE — Progress Notes (Signed)
[facility] Progress Note [place] TEAM 1 - Stepdown/ICU TEAM   TAMANIKA HEINEY BJY:[id number] DOB: 1951-06-20 DOA: 07/29/2013 PCP: Ernst Breach, PA-C  Brief narrative: 62 y.o. female with PMH of asthma, alcoholism, tobacco use presented with SOB, progressive DOE, PND, and orthopnea, associated with LE edema and was subsequently found to be in acute CHF. Per patient, she had not seen a doctor in over a year.   HPI/Subjective: The patient has no new complaints today.  She has many questions about the aortic valve procedure.  Assessment/Plan:  Acute systolic CHF with severe AoS and mod to severe pulm HTN - Cardiology and TCTS following - TAVR being considered, possibly for Tuesday - Volume status appears balanced - Cath negative for significant CAD  COPD, severe - cont current treatment - well compensated at present - body habitus concerning for possible sleep apnea the patient reports she has never had a sleep study - I have discussed this with her and will suggest a sleep study be accomplished after discharge  Mod/Severe Pulm HTN -clearly signif COPD is most likley driving force, but L heart disease may also be contributing - pt has been counseled multiple times that she can never smoke again - as noted above, outpt eval for possible OSA is also indicated   Tobacco abuse - explained that ongoing smoking to any extent will unquestionably shorten her life   Anxiety state, unspecified - cont Ativan PRN - well managed at present   Elevated TSH - Free T4 normal c/w sick euthyroid syndrome  UTI - Levaquin x 5 days completed   ETOH abuse - no signs of withdrawal  Macrocytosis without anemia Likely due to direct toxic effects of alcohol - B12 and folic acid within normal   Hyperglycemia Multiple serum glucose measurements have been elevated - A1c not yet c/w true DM - she history of diabetes - I have explained the importance of lifestyle modification and weight loss  to her as a method to prevent development of true diabetes  Obesity - Body mass index is 36.38 kg/(m^2).   Code Status: FULL Family Communication: Discussed care with patient at bedside along with son Disposition Plan: SDU awaiting TVAR  Consultants: Cardiology TCTS  Procedures: 12/8 cardiac cath - left and right heart - widely patent coronary arteries with no evidence of significant CAD - moderate to severe pulmonary hypertension  Antibiotics: Levaquin 12/08>>12/12  DVT prophylaxis: SQ Heparin  Objective: Blood pressure 99/44, pulse 68, temperature 97.5 F (36.4 C), temperature source Oral, resp. rate 30, height 5' (1.524 m), weight 84.5 kg (186 lb 4.6 oz), SpO2 97.00%.  Intake/Output Summary (Last 24 hours) at 08/07/13 0846 Last data filed at 08/07/13 0700  Gross per 24 hour  Intake    720 ml  Output    750 ml  Net    -30 ml   Exam: General: No acute respiratory distress sitting in bed Lungs: Clear to auscultation bilaterally without wheezes or crackles - bs distant th/o all fields  Cardiovascular: Regular rate and rhythm - 2/6 holosystolic murmur Abdomen: Nontender, nondistended, soft, bowel sounds positive, no rebound, no ascites, no appreciable mass Extremities: No significant cyanosis, clubbing, mild pitting edema  Data Reviewed: Basic Metabolic Panel:  Recent Labs Lab 08/01/13 0448 08/01/13 1701 08/03/13 0355 08/05/13 0529 08/06/13 0555  NA 137  --  136 136 134*  K 4.2  --  4.1 3.9 4.2  CL 93*  --  91* 91* 89*  CO2 39*  --  38* 37* 36*  GLUCOSE 117*  --  139* 136* 146*  BUN 19  --  17 22 27*  CREATININE 1.31* 0.99 0.96 1.10 1.15*  CALCIUM 8.8  --  8.7 9.1 9.0   Liver Function Tests:  Recent Labs Lab 08/06/13 0555  AST 202*  ALT 163*  ALKPHOS 61  BILITOT 0.8  PROT 6.3  ALBUMIN 3.1*   CBC:  Recent Labs Lab 08/01/13 1701  WBC 8.9  HGB 13.5  HCT 42.9  MCV 104.9*  PLT 313    Recent Results (from the past 240 hour(s))  MRSA PCR  SCREENING     Status: None   Collection Time    07/29/13  3:59 PM      Result Value Range Status   MRSA by PCR NEGATIVE  NEGATIVE Final   Comment:            The GeneXpert MRSA Assay (FDA     approved for NASAL specimens     only), is one component of a     comprehensive MRSA colonization     surveillance program. It is not     intended to diagnose MRSA     infection nor to guide or     monitor treatment for     MRSA infections.     Studies:  Recent x-ray studies have been reviewed in detail by the Attending Physician  Scheduled Meds:  Scheduled Meds: . bisoprolol  2.5 mg Oral Daily  . folic acid  1 mg Oral Daily  . furosemide  40 mg Oral Daily  . heparin  5,000 Units Subcutaneous Q8H  . mometasone-formoterol  2 puff Inhalation BID  . multivitamin with minerals  1 tablet Oral Daily  . polyethylene glycol  17 g Oral Daily  . senna-docusate  1 tablet Oral BID  . tiotropium  18 mcg Inhalation Daily    Time spent on care of this patient: 25 min   Kourtnie Sachs T, MD  [facility] Office  [phone] Pager - Text Page per Loretha Stapler as per below:  On-Call/Text Page:      [email or web address]      password [email or web address]  If 7PM-7AM, please contact night-coverage [email or web address] Password [email or web address] 08/07/2013, 8:46 AM   LOS: 9 days

## 2013-08-07 NOTE — Progress Notes (Signed)
Pt was sleeping and family said she refuses cpap.

## 2013-08-07 NOTE — Progress Notes (Signed)
Pt continues to refuse CPAP at night.

## 2013-08-08 DIAGNOSIS — I359 Nonrheumatic aortic valve disorder, unspecified: Secondary | ICD-10-CM

## 2013-08-08 DIAGNOSIS — F411 Generalized anxiety disorder: Secondary | ICD-10-CM

## 2013-08-08 LAB — ABO/RH: ABO/RH(D): O POS

## 2013-08-08 LAB — BASIC METABOLIC PANEL
BUN: 26 mg/dL — ABNORMAL HIGH (ref 6–23)
CO2: 37 mEq/L — ABNORMAL HIGH (ref 19–32)
Calcium: 9 mg/dL (ref 8.4–10.5)
GFR calc non Af Amer: 57 mL/min — ABNORMAL LOW (ref >90)
Glucose, Bld: 119 mg/dL — ABNORMAL HIGH (ref 70–99)
Sodium: 136 mEq/L (ref 135–145)

## 2013-08-08 MED ORDER — METOPROLOL TARTRATE 12.5 MG HALF TABLET
12.5000 mg | ORAL_TABLET | Freq: Once | ORAL | Status: AC
  Administered 2013-08-09: 12.5 mg via ORAL
  Filled 2013-08-08: qty 1

## 2013-08-08 MED ORDER — NITROGLYCERIN IN D5W 200-5 MCG/ML-% IV SOLN
2.0000 ug/min | INTRAVENOUS | Status: DC
  Filled 2013-08-08: qty 250

## 2013-08-08 MED ORDER — SODIUM CHLORIDE 0.9 % IV SOLN
INTRAVENOUS | Status: DC
  Filled 2013-08-08: qty 30

## 2013-08-08 MED ORDER — BISACODYL 5 MG PO TBEC
5.0000 mg | DELAYED_RELEASE_TABLET | Freq: Once | ORAL | Status: AC
  Administered 2013-08-09: 5 mg via ORAL
  Filled 2013-08-08: qty 1

## 2013-08-08 MED ORDER — POTASSIUM CHLORIDE 2 MEQ/ML IV SOLN
80.0000 meq | INTRAVENOUS | Status: DC
  Filled 2013-08-08: qty 40

## 2013-08-08 MED ORDER — LORAZEPAM 0.5 MG PO TABS
0.5000 mg | ORAL_TABLET | Freq: Three times a day (TID) | ORAL | Status: DC | PRN
Start: 2013-08-08 — End: 2013-08-09
  Administered 2013-08-08: 0.5 mg via ORAL
  Filled 2013-08-08: qty 1

## 2013-08-08 MED ORDER — DEXMEDETOMIDINE HCL IN NACL 400 MCG/100ML IV SOLN
0.1000 ug/kg/h | INTRAVENOUS | Status: DC
  Filled 2013-08-08: qty 100

## 2013-08-08 MED ORDER — DEXTROSE 5 % IV SOLN
0.0000 ug/min | INTRAVENOUS | Status: DC
  Filled 2013-08-08: qty 8

## 2013-08-08 MED ORDER — SODIUM CHLORIDE 0.9 % IV SOLN
1250.0000 mg | INTRAVENOUS | Status: AC
  Administered 2013-08-09: 1250 mg via INTRAVENOUS
  Filled 2013-08-08: qty 1250

## 2013-08-08 MED ORDER — SODIUM CHLORIDE 0.9 % IV SOLN
INTRAVENOUS | Status: DC
  Filled 2013-08-08: qty 1

## 2013-08-08 MED ORDER — PHENYLEPHRINE HCL 10 MG/ML IJ SOLN
30.0000 ug/min | INTRAVENOUS | Status: DC
  Filled 2013-08-08: qty 2

## 2013-08-08 MED ORDER — EPINEPHRINE HCL 1 MG/ML IJ SOLN
0.5000 ug/min | INTRAVENOUS | Status: DC
  Filled 2013-08-08: qty 4

## 2013-08-08 MED ORDER — DOPAMINE-DEXTROSE 3.2-5 MG/ML-% IV SOLN
2.0000 ug/kg/min | INTRAVENOUS | Status: DC
  Filled 2013-08-08: qty 250

## 2013-08-08 MED ORDER — DEXTROSE 5 % IV SOLN
1.5000 g | INTRAVENOUS | Status: AC
  Administered 2013-08-09: 1.5 g via INTRAVENOUS
  Filled 2013-08-08: qty 1.5

## 2013-08-08 MED ORDER — MAGNESIUM SULFATE 50 % IJ SOLN
40.0000 meq | INTRAMUSCULAR | Status: DC
  Filled 2013-08-08: qty 10

## 2013-08-08 MED ORDER — TEMAZEPAM 15 MG PO CAPS
15.0000 mg | ORAL_CAPSULE | Freq: Once | ORAL | Status: AC | PRN
  Filled 2013-08-08: qty 1

## 2013-08-08 NOTE — Progress Notes (Signed)
       Subjective:  Anxious. Attitude is starting to worsen she says. We talked about this. I tried to encourage her.   Ambulated today.   Objective:  Vital Signs in the last 24 hours: Temp:  [97.4 F (36.3 C)-97.9 F (36.6 C)] 97.9 F (36.6 C) (12/15 0410) Pulse Rate:  [58-73] 73 (12/15 0830) Resp:  [20-36] 20 (12/15 0830) BP: (91-117)/(47-58) 102/58 mmHg (12/15 0830) SpO2:  [91 %-99 %] 97 % (12/15 0830)  Intake/Output from previous day: 12/14 0701 - 12/15 0700 In: 720 [P.O.:720] Out: 1400 [Urine:1400]   Physical Exam: General: Looks older than stated age, in no acute distress. Head:  Normocephalic and atraumatic. Lungs: Scattered wheeze. Heart: Normal S1 and S2.  3/6 SM RUSB with radiation to carotids. No gallops.  Abdomen: soft, non-tender, positive bowel sounds. Extremities: No clubbing or cyanosis. No edema. Neurologic: Alert and oriented x 3.    Lab Results:   Recent Labs  08/06/13 0555 08/08/13 0525  NA 134* 136  K 4.2 4.0  CL 89* 91*  CO2 36* 37*  GLUCOSE 146* 119*  BUN 27* 26*  CREATININE 1.15* 1.04    Hepatic Function Panel  Recent Labs  08/06/13 0555  PROT 6.3  ALBUMIN 3.1*  AST 202*  ALT 163*  ALKPHOS 61  BILITOT 0.8    Telemetry: NSR, no adverse rhythms Personally viewed.   Cardiac Studies:  CT as above.   Assessment/Plan:  Principal Problem:   Acute CHF Active Problems:   COPD, severe   Alcohol abuse   CHF (congestive heart failure)   Tobacco abuse   Acute systolic heart failure   Aortic stenosis, severe   Anxiety state, unspecified   Pulmonary hypertension, moderate to severe   Elevated TSH   1) Acute systolic heart failure - EF <30%. BP has been low. Lasix now 40mg  PO QD. Low dose bisoprolol 2.5mg  QD. Unable to tolerate increased dose due to hypotension. Walked 250 ft with rehab. Creat stable/improved.   2) Severe Aortic stenosis. - reviewed Dr. Excell Seltzer and Dr. Laneta Simmers notes. Potential TAVR. CT above. Potential for  Tuesday. CT abd done. Reviewed Dr. Tyrone Sage note.   3) Morbid obesity - weight loss  4) Severe COPD - tobacco cessation.   5) Aortic root dilitation - 4.5cm.   Watchful waiting today. Wait to hear on final plan from TAVR team.   Cathy Moody 08/08/2013, 10:02 AM

## 2013-08-08 NOTE — Progress Notes (Signed)
    Subjective:  Feeling better, but anxious about surgery tomorrow. No chest pain. Shortness of breath with activity but not at rest.   Objective:  Vital Signs in the last 24 hours: Temp:  [97.6 F (36.4 C)-97.9 F (36.6 C)] 97.6 F (36.4 C) (12/15 1900) Pulse Rate:  [68-73] 72 (12/15 1645) Resp:  [18-30] 30 (12/15 2014) BP: (94-117)/(24-58) 105/24 mmHg (12/15 2014) SpO2:  [91 %-100 %] 97 % (12/15 2101)  Intake/Output from previous day: 12/14 0701 - 12/15 0700 In: 720 [P.O.:720] Out: 1400 [Urine:1400]  Physical Exam: Pt is alert and oriented, NAD HEENT: normal Neck: JVP - normal, carotids delayed Lungs: CTA bilaterally but diminished air movement CV: RRR with grade 3/6 harsh systolic murmur at the RUSB Abd: soft, NT, Positive BS, no hepatomegaly Ext: trace pretibial edema bilaterally, distal pulses intact and equal Skin: warm/dry no rash   Lab Results: No results found for this basename: WBC, HGB, PLT,  in the last 72 hours  Recent Labs  08/06/13 0555 08/08/13 0525  NA 134* 136  K 4.2 4.0  CL 89* 91*  CO2 36* 37*  GLUCOSE 146* 119*  BUN 27* 26*  CREATININE 1.15* 1.04   No results found for this basename: TROPONINI, CK, MB,  in the last 72 hours  Cardiac Studies: Echo/cath reviewed   Tele: Sinus rhythm, PAC's  Assessment/Plan:  Critical AS with severe nonischemic cardiomyopathy and acute on chronic systolic heart failure. Plans for TAVR via transapical approach tomorrow. Have discussed at length with Dr Laneta Simmers and the patient as well as her family. Have reviewed expected post-op clinical course, potential complication, and treatment alternatives. As per note of Dr Laneta Simmers, we are in agreement her surgical risk is exceedingly high with STS predicted mortality of 10% in setting of severe obstructive lung disease and NYHA Class 4 heart failure.  All questions answered; labs, meds, and orders reviewed.   Cathy Moody, M.D. 08/08/2013, 11:22 PM

## 2013-08-08 NOTE — Progress Notes (Signed)
7 Days Post-Op Procedure(s) (LRB): LEFT AND RIGHT HEART CATHETERIZATION WITH CORONARY ANGIOGRAM (N/A) Subjective:  Nervous about surgery but ready   Objective: Vital signs in last 24 hours: Temp:  [97.6 F (36.4 C)-97.9 F (36.6 C)] 97.6 F (36.4 C) (12/15 1241) Pulse Rate:  [68-73] 68 (12/15 1241) Cardiac Rhythm:  [-] Normal sinus rhythm (12/15 1241) Resp:  [20-28] 20 (12/15 0830) BP: (92-117)/(44-58) 94/44 mmHg (12/15 1241) SpO2:  [91 %-97 %] 97 % (12/15 1241)  Hemodynamic parameters for last 24 hours:    Intake/Output from previous day: 12/14 0701 - 12/15 0700 In: 720 [P.O.:720] Out: 1400 [Urine:1400] Intake/Output this shift: Total I/O In: 120 [P.O.:120] Out: -   General appearance: alert and cooperative, sitting straight up in bed. Heart: regular rate and rhythm Lungs: diminished breath sounds bilaterally No peripheral edema  Lab Results: No results found for this basename: WBC, HGB, HCT, PLT,  in the last 72 hours BMET:  Recent Labs  08/06/13 0555 08/08/13 0525  NA 134* 136  Moody 4.2 4.0  CL 89* 91*  CO2 36* 37*  GLUCOSE 146* 119*  BUN 27* 26*  CREATININE 1.15* 1.04  CALCIUM 9.0 9.0    PT/INR: No results found for this basename: LABPROT, INR,  in the last 72 hours ABG    Component Value Date/Time   PHART 7.365 08/01/2013 1423   HCO3 36.6* 08/01/2013 1423   TCO2 39 08/01/2013 1423   O2SAT 94.0 08/01/2013 1423   CBG (last 3)  No results found for this basename: GLUCAP,  in the last 72 hours  Assessment/Plan: Critical aortic stenosis with low EF, severe COPD, obesity.   During the course of the patient's preoperative work up they have been evaluated comprehensively by a multidisciplinary team of specialists coordinated through the Multid[facility]ve [facility] in the [facility].  They have been demonstrated to suffer from symptomatic severe aortic stenosis as noted above. The patient has been counseled extensively as to  the relative risks and benefits of all options for the treatment of severe aortic stenosis including long term medical therapy, conventional surgery for aortic valve replacement, and transcatheter aortic valve replacement.  The patient has been independently evaluated by two cardiac surgeons including myself and Dr. Sheliah Plane, and Dr. Tonny Bollman from Interventional Cardiology. They all felt her to be at extremely high risk for conventional surgical aortic valve replacement based upon a predicted risk of mortality using the Society of Thoracic Surgeons risk calculator of 9.9% and all of [place] feel that the patient would be a poor candidate for conventional surgery (predicted risk of mortality >15% and/or predicted risk of permanent morbidity >50%) because of comorbidities including morbid obesity, severe COPD with an FEV1 of 0.78 with CO2 retention and a PCO2 of 60, low EF of 25%.    Based upon review of all of the patient's preoperative diagnostic tests they are felt to be candidate for transcatheter aortic valve replacement using the transapical approach as an alternative to high risk conventional surgery.  Her ascending aorta is aneurysmal with a diameter of 4.5 cm and I think a transaortic approach would be more likely to result in aortic complications. In addition her ascending aorta is relatively short due to her body habitus and it may not be possible to get adequate working length above the valve. Although her LV function is severely depressed I think the risk of a transapical approach is less.  Following the decision to proceed with transcatheter aortic  valve replacement, a discussion has been held regarding what types of management strategies would be attempted intraoperatively in the event of life-threatening complications, including whether or not the patient would be considered a candidate for the use of cardiopulmonary bypass and/or conversion to open sternotomy for attempted surgical  intervention.  The patient has been advised of a variety of complications that might develop including but not limited to risks of death, stroke, paravalvular leak, aortic dissection or other major vascular complications, aortic annulus rupture, device embolization, cardiac rupture or perforation, mitral regurgitation, acute myocardial infarction, arrhythmia, heart block or bradycardia requiring permanent pacemaker placement, congestive heart failure, respiratory failure, renal failure, pneumonia, infection, other late complications related to structural valve deterioration or migration, or other complications that might ultimately cause a temporary or permanent loss of functional independence or other long term morbidity.  I told her and her family that I would put her on CPB through the groin if needed for hemodynamic stabilization and would perform a sternotomy if I felt there was a surgically correctable problem requiring that. The patient provides full informed consent for the procedure as described and all questions were answered.                        LOS: 10 days    Cathy Moody 08/08/2013

## 2013-08-08 NOTE — Progress Notes (Signed)
CARDIAC REHAB PHASE I   PRE:  Rate/Rhythm: 74SR  BP:  Supine:   Sitting: 102/58  Standing:    SaO2: 95%4L  MODE:  Ambulation: 170 ft   POST:  Rate/Rhythm: 94SR  BP:  Supine:   Sitting: 95/34  Standing:    SaO2: 91% 4L [id number] Pt walked 170 ft on 4L with rolling walker and asst x 2. One person to handle monitor and oxygen and one due to pt c/o dizziness. Could not get pt to go farther as she c/o nausea, dizziness and tired from ativan. Stated she would try to go farther with PT later. To recliner after walk. Call bell in reach. Family in room. Will need motivation after surgery.   Cathy Nutting, RN BSN  08/08/2013 9:22 AM

## 2013-08-08 NOTE — Progress Notes (Signed)
PT Cancellation Note  Patient Details Name: Cathy Moody MRN: [id number] DOB: May 04, 1951   Cancelled Treatment:    Reason Eval/Treat Not Completed: Patient at procedure or test/unavailable (pt currently working with cardiac rehab and unavailable)   Toney Sang Beth 08/08/2013, 9:15 AM Delaney Meigs, PT [phone]

## 2013-08-08 NOTE — Progress Notes (Signed)
[facility] Progress Note [place] TEAM 1 - Stepdown/ICU TEAM   EUGINA ROW ZOX:[id number] DOB: 1950-12-26 DOA: 07/29/2013 PCP: Ernst Breach, PA-C  Brief narrative: 62 y.o. female with PMH of asthma, alcoholism, tobacco use presented with SOB, progressive DOE, PND, and orthopnea, associated with LE edema and was subsequently found to be in acute CHF. Per patient, she had not seen a doctor in over a year.   HPI/Subjective: The patient has no new complaints today.  She still has many questions about the aortic valve procedure.  Assessment/Plan:  Acute systolic CHF with severe AoS and mod to severe pulm HTN - Cardiology and TCTS following - TAVR being considered, possibly for Tuesday? - Volume status appears balanced - Cath negative for significant CAD  COPD, severe - cont current treatment - well compensated at present - body habitus concerning for possible sleep apnea - the patient reports she has never had a sleep study - I have discussed this with her and will suggest a sleep study be accomplished after discharge  Mod/Severe Pulm HTN -clearly signif COPD is most likley driving force, but L heart disease may also be contributing - pt has been counseled multiple times that she can never smoke again - as noted above, outpt eval for possible OSA is also indicated   Tobacco abuse - explained that ongoing smoking to any extent will unquestionably shorten her life   Anxiety state, unspecified - cont Ativan PRN - well managed at present   Elevated TSH - Free T4 normal c/w sick euthyroid syndrome  UTI - Levaquin x 5 days completed   ETOH abuse - no signs of withdrawal  Macrocytosis without anemia Likely due to direct toxic effects of alcohol - B12 and folic acid within normal   Hyperglycemia Multiple serum glucose measurements have been elevated - A1c not yet c/w true DM - she has a family history of diabetes - I have explained the importance of lifestyle  modification and weight loss to her as a method to prevent development of true diabetes  Obesity - Body mass index is 36.38 kg/(m^2).   Code Status: FULL Family Communication: Discussed care with patient at bedside along with son and mother  Disposition Plan: SDU awaiting TVAR  Consultants: Cardiology TCTS  Procedures: 12/8 cardiac cath - left and right heart - widely patent coronary arteries with no evidence of significant CAD - moderate to severe pulmonary hypertension  Antibiotics: Levaquin 12/08>>12/12  DVT prophylaxis: SQ Heparin  Objective: Blood pressure 102/58, pulse 73, temperature 97.9 F (36.6 C), temperature source Oral, resp. rate 20, height 5' (1.524 m), weight 84.5 kg (186 lb 4.6 oz), SpO2 97.00%.  Intake/Output Summary (Last 24 hours) at 08/08/13 1138 Last data filed at 08/08/13 0700  Gross per 24 hour  Intake    720 ml  Output   1400 ml  Net   -680 ml   Exam: General: No acute respiratory distress sitting in bed Lungs: Clear to auscultation bilaterally without wheezes or crackles - bs distant th/o all fields  Cardiovascular: Regular rate and rhythm - 2/6 holosystolic murmur Abdomen: Nontender, nondistended, soft, bowel sounds positive, no rebound, no ascites, no appreciable mass Extremities: No significant cyanosis, clubbing, mild pitting edema  Data Reviewed: Basic Metabolic Panel:  Recent Labs Lab 08/01/13 1701 08/03/13 0355 08/05/13 0529 08/06/13 0555 08/08/13 0525  NA  --  136 136 134* 136  K  --  4.1 3.9 4.2 4.0  CL  --  91* 91* 89* 91*  CO2  --  38* 37* 36* 37*  GLUCOSE  --  139* 136* 146* 119*  BUN  --  17 22 27* 26*  CREATININE 0.99 0.96 1.10 1.15* 1.04  CALCIUM  --  8.7 9.1 9.0 9.0   Liver Function Tests:  Recent Labs Lab 08/06/13 0555  AST 202*  ALT 163*  ALKPHOS 61  BILITOT 0.8  PROT 6.3  ALBUMIN 3.1*   CBC:  Recent Labs Lab 08/01/13 1701  WBC 8.9  HGB 13.5  HCT 42.9  MCV 104.9*  PLT 313    Recent Results  (from the past 240 hour(s))  MRSA PCR SCREENING     Status: None   Collection Time    07/29/13  3:59 PM      Result Value Range Status   MRSA by PCR NEGATIVE  NEGATIVE Final   Comment:            The GeneXpert MRSA Assay (FDA     approved for NASAL specimens     only), is one component of a     comprehensive MRSA colonization     surveillance program. It is not     intended to diagnose MRSA     infection nor to guide or     monitor treatment for     MRSA infections.     Studies:  Recent x-ray studies have been reviewed in detail by the Attending Physician  Scheduled Meds:  Scheduled Meds: . bisoprolol  2.5 mg Oral Daily  . folic acid  1 mg Oral Daily  . furosemide  40 mg Oral Daily  . mometasone-formoterol  2 puff Inhalation BID  . multivitamin with minerals  1 tablet Oral Daily  . polyethylene glycol  17 g Oral Daily  . senna-docusate  1 tablet Oral BID  . tiotropium  18 mcg Inhalation Daily    Time spent on care of this patient: 25 min   Ryatt Corsino T, MD  [facility] Office  [phone] Pager - Text Page per Loretha Stapler as per below:  On-Call/Text Page:      [email or web address]      password [email or web address]  If 7PM-7AM, please contact night-coverage [email or web address] Password [email or web address] 08/08/2013, 11:38 AM   LOS: 10 days

## 2013-08-08 NOTE — Progress Notes (Signed)
Pt has continued to refuse cpap.

## 2013-08-09 ENCOUNTER — Encounter (HOSPITAL_COMMUNITY): Payer: Self-pay | Admitting: Certified Registered"

## 2013-08-09 ENCOUNTER — Inpatient Hospital Stay (HOSPITAL_COMMUNITY): Payer: BC Managed Care – PPO

## 2013-08-09 ENCOUNTER — Encounter (HOSPITAL_COMMUNITY): Payer: BC Managed Care – PPO | Admitting: Anesthesiology

## 2013-08-09 ENCOUNTER — Encounter (HOSPITAL_COMMUNITY)
Admission: EM | Disposition: A | Discharge status: Home / Self Care | Payer: BC Managed Care – PPO | Source: Home / Self Care | Attending: Internal Medicine

## 2013-08-09 ENCOUNTER — Inpatient Hospital Stay (HOSPITAL_COMMUNITY): Payer: BC Managed Care – PPO | Admitting: Anesthesiology

## 2013-08-09 DIAGNOSIS — I359 Nonrheumatic aortic valve disorder, unspecified: Secondary | ICD-10-CM

## 2013-08-09 DIAGNOSIS — Z952 Presence of prosthetic heart valve: Secondary | ICD-10-CM

## 2013-08-09 HISTORY — PX: TRANSCATHETER AORTIC VALVE REPLACEMENT, TRANSAPICAL: SHX6401

## 2013-08-09 HISTORY — PX: INTRAOPERATIVE TRANSESOPHAGEAL ECHOCARDIOGRAM: SHX5062

## 2013-08-09 LAB — POCT I-STAT 3, ART BLOOD GAS (G3+)
Acid-Base Excess: 14 mmol/L — ABNORMAL HIGH (ref 0.0–2.0)
Acid-Base Excess: 9 mmol/L — ABNORMAL HIGH (ref 0.0–2.0)
Acid-Base Excess: 11 mmol/L — ABNORMAL HIGH (ref 0.0–2.0)
Acid-Base Excess: 5 mmol/L — ABNORMAL HIGH (ref 0.0–2.0)
Bicarbonate: 39 mEq/L — ABNORMAL HIGH (ref 20.0–24.0)
Bicarbonate: 36 mEq/L — ABNORMAL HIGH (ref 20.0–24.0)
Bicarbonate: 32.2 mEq/L — ABNORMAL HIGH (ref 20.0–24.0)
Bicarbonate: 36 mEq/L — ABNORMAL HIGH (ref 20.0–24.0)
O2 Saturation: 100 %
O2 Saturation: 100 %
O2 Saturation: 89 %
Patient temperature: 35.7
TCO2: 38 mmol/L (ref 0–100)
TCO2: 39 mmol/L (ref 0–100)
TCO2: 34 mmol/L (ref 0–100)
TCO2: 38 mmol/L (ref 0–100)
pCO2 arterial: 51.3 mmHg — ABNORMAL HIGH (ref 35.0–45.0)
pCO2 arterial: 54.3 mmHg — ABNORMAL HIGH (ref 35.0–45.0)
pCO2 arterial: 64.6 mmHg (ref 35.0–45.0)
pH, Arterial: 7.489 — ABNORMAL HIGH (ref 7.350–7.450)
pH, Arterial: 7.44 (ref 7.350–7.450)
pH, Arterial: 7.351 (ref 7.350–7.450)
pO2, Arterial: 300 mmHg — ABNORMAL HIGH (ref 80.0–100.0)
pO2, Arterial: 184 mmHg — ABNORMAL HIGH (ref 80.0–100.0)
pO2, Arterial: 59 mmHg — ABNORMAL LOW (ref 80.0–100.0)

## 2013-08-09 LAB — CBC
HCT: 39.3 % (ref 36.0–46.0)
HCT: 39.1 % (ref 36.0–46.0)
Hemoglobin: 11.5 g/dL — ABNORMAL LOW (ref 12.0–15.0)
Hemoglobin: 12.6 g/dL (ref 12.0–15.0)
MCH: 33.6 pg (ref 26.0–34.0)
MCHC: 32.2 g/dL (ref 30.0–36.0)
MCV: 103.2 fL — ABNORMAL HIGH (ref 78.0–100.0)
Platelets: 246 10*3/uL (ref 150–400)
Platelets: 155 10*3/uL (ref 150–400)
RBC: 3.76 MIL/uL — ABNORMAL LOW (ref 3.87–5.11)
RBC: 3.42 MIL/uL — ABNORMAL LOW (ref 3.87–5.11)
RDW: 13.8 % (ref 11.5–15.5)
WBC: 9.8 10*3/uL (ref 4.0–10.5)
WBC: 11.3 10*3/uL — ABNORMAL HIGH (ref 4.0–10.5)
WBC: 14.6 10*3/uL — ABNORMAL HIGH (ref 4.0–10.5)

## 2013-08-09 LAB — BASIC METABOLIC PANEL
BUN: 24 mg/dL — ABNORMAL HIGH (ref 6–23)
Chloride: 91 mEq/L — ABNORMAL LOW (ref 96–112)
GFR calc Af Amer: 72 mL/min — ABNORMAL LOW (ref >90)
Glucose, Bld: 110 mg/dL — ABNORMAL HIGH (ref 70–99)
Potassium: 3.8 mEq/L (ref 3.5–5.1)

## 2013-08-09 LAB — SURGICAL PCR SCREEN: Staphylococcus aureus: NEGATIVE

## 2013-08-09 LAB — BLOOD GAS, ARTERIAL
Bicarbonate: 38.2 mEq/L — ABNORMAL HIGH (ref 20.0–24.0)
Patient temperature: 98.6
pH, Arterial: 7.394 (ref 7.350–7.450)

## 2013-08-09 LAB — POCT I-STAT 4, (NA,K, GLUC, HGB,HCT)
Glucose, Bld: 121 mg/dL — ABNORMAL HIGH (ref 70–99)
HCT: 38 % (ref 36.0–46.0)
HCT: 35 % — ABNORMAL LOW (ref 36.0–46.0)
Hemoglobin: 12.9 g/dL (ref 12.0–15.0)
Hemoglobin: 11.9 g/dL — ABNORMAL LOW (ref 12.0–15.0)
Hemoglobin: 12.6 g/dL (ref 12.0–15.0)
Potassium: 4 mEq/L (ref 3.5–5.1)
Potassium: 4 mEq/L (ref 3.5–5.1)
Potassium: 3.3 mEq/L — ABNORMAL LOW (ref 3.5–5.1)
Sodium: 136 mEq/L (ref 135–145)
Sodium: 136 mEq/L (ref 135–145)
Sodium: 134 mEq/L — ABNORMAL LOW (ref 135–145)
Sodium: 135 mEq/L (ref 135–145)

## 2013-08-09 LAB — URINALYSIS, ROUTINE W REFLEX MICROSCOPIC
Bilirubin Urine: NEGATIVE
Hgb urine dipstick: NEGATIVE
Ketones, ur: NEGATIVE mg/dL
Nitrite: NEGATIVE
Specific Gravity, Urine: 1.016 (ref 1.005–1.030)
Urobilinogen, UA: 0.2 mg/dL (ref 0.0–1.0)
pH: 5 (ref 5.0–8.0)

## 2013-08-09 LAB — URINE MICROSCOPIC-ADD ON

## 2013-08-09 LAB — CREATININE, SERUM
Creatinine, Ser: 0.86 mg/dL (ref 0.50–1.10)
GFR calc Af Amer: 83 mL/min — ABNORMAL LOW (ref >90)
GFR calc non Af Amer: 71 mL/min — ABNORMAL LOW (ref >90)

## 2013-08-09 LAB — PROTIME-INR
INR: 1.19 (ref 0.00–1.49)
INR: 1.48 (ref 0.00–1.49)
Prothrombin Time: 17.5 seconds — ABNORMAL HIGH (ref 11.6–15.2)

## 2013-08-09 LAB — POCT I-STAT, CHEM 8
BUN: 19 mg/dL (ref 6–23)
Chloride: 90 mEq/L — ABNORMAL LOW (ref 96–112)
HCT: 41 % (ref 36.0–46.0)
Sodium: 136 mEq/L (ref 135–145)
TCO2: 36 mmol/L (ref 0–100)

## 2013-08-09 LAB — GLUCOSE, CAPILLARY
Glucose-Capillary: 113 mg/dL — ABNORMAL HIGH (ref 70–99)
Glucose-Capillary: 115 mg/dL — ABNORMAL HIGH (ref 70–99)
Glucose-Capillary: 119 mg/dL — ABNORMAL HIGH (ref 70–99)

## 2013-08-09 SURGERY — REPLACEMENT, AORTIC VALVE, TRANSAPICAL APPROACH
Anesthesia: General

## 2013-08-09 MED ORDER — MAGNESIUM SULFATE 40 MG/ML IJ SOLN
4.0000 g | Freq: Once | INTRAMUSCULAR | Status: AC
  Administered 2013-08-09: 4 g via INTRAVENOUS
  Filled 2013-08-09: qty 100

## 2013-08-09 MED ORDER — SODIUM CHLORIDE 0.9 % IJ SOLN
INTRAMUSCULAR | Status: DC | PRN
  Administered 2013-08-09: 10 mL via INTRAVENOUS

## 2013-08-09 MED ORDER — ROCURONIUM BROMIDE 100 MG/10ML IV SOLN
INTRAVENOUS | Status: DC | PRN
  Administered 2013-08-09 (×2): 50 mg via INTRAVENOUS

## 2013-08-09 MED ORDER — ACETAMINOPHEN 650 MG RE SUPP
650.0000 mg | Freq: Once | RECTAL | Status: AC
  Administered 2013-08-09: 650 mg via RECTAL

## 2013-08-09 MED ORDER — LACTATED RINGERS IV SOLN
INTRAVENOUS | Status: DC | PRN
  Administered 2013-08-09: 11:00:00 via INTRAVENOUS

## 2013-08-09 MED ORDER — SODIUM CHLORIDE 0.9 % IJ SOLN: 3.0000 mL | INTRAMUSCULAR | Status: DC | PRN

## 2013-08-09 MED ORDER — SODIUM CHLORIDE 0.9 % IV SOLN
1.0000 mL/kg/h | INTRAVENOUS | Status: DC
  Administered 2013-08-09: 1 mL/kg/h via INTRAVENOUS

## 2013-08-09 MED ORDER — ARTIFICIAL TEARS OP OINT
TOPICAL_OINTMENT | OPHTHALMIC | Status: DC | PRN
  Administered 2013-08-09: 1 via OPHTHALMIC

## 2013-08-09 MED ORDER — POTASSIUM CHLORIDE 10 MEQ/50ML IV SOLN
10.0000 meq | Freq: Once | INTRAVENOUS | Status: AC
  Administered 2013-08-09: 10 meq via INTRAVENOUS

## 2013-08-09 MED ORDER — SODIUM CHLORIDE 0.9 % IV SOLN: 250.0000 mL | INTRAVENOUS | Status: DC | PRN

## 2013-08-09 MED ORDER — SODIUM CHLORIDE 0.9 % IV SOLN: INTRAVENOUS | Status: DC

## 2013-08-09 MED ORDER — ASPIRIN 81 MG PO CHEW: 324.0000 mg | CHEWABLE_TABLET | Freq: Every day | ORAL | Status: DC

## 2013-08-09 MED ORDER — CEFUROXIME SODIUM 1.5 G IJ SOLR
1.5000 g | Freq: Two times a day (BID) | INTRAMUSCULAR | Status: AC
  Administered 2013-08-09 – 2013-08-11 (×4): 1.5 g via INTRAVENOUS
  Filled 2013-08-09 (×4): qty 1.5

## 2013-08-09 MED ORDER — LEVALBUTEROL HCL 0.63 MG/3ML IN NEBU
0.6300 mg | INHALATION_SOLUTION | Freq: Four times a day (QID) | RESPIRATORY_TRACT | Status: DC
  Administered 2013-08-09 – 2013-08-13 (×14): 0.63 mg via RESPIRATORY_TRACT
  Filled 2013-08-09 (×18): qty 3

## 2013-08-09 MED ORDER — VECURONIUM BROMIDE 10 MG IV SOLR
INTRAVENOUS | Status: DC | PRN
  Administered 2013-08-09: 10 mg via INTRAVENOUS

## 2013-08-09 MED ORDER — ACETAMINOPHEN 160 MG/5ML PO SOLN
1000.0000 mg | Freq: Four times a day (QID) | ORAL | Status: DC
  Filled 2013-08-09: qty 40

## 2013-08-09 MED ORDER — DEXMEDETOMIDINE HCL IN NACL 200 MCG/50ML IV SOLN
0.1000 ug/kg/h | INTRAVENOUS | Status: DC
  Filled 2013-08-09: qty 50

## 2013-08-09 MED ORDER — OXYCODONE HCL 5 MG PO TABS
5.0000 mg | ORAL_TABLET | ORAL | Status: DC | PRN
  Administered 2013-08-09: 5 mg via ORAL
  Administered 2013-08-10 – 2013-08-11 (×5): 10 mg via ORAL
  Administered 2013-08-11: 5 mg via ORAL
  Filled 2013-08-09: qty 2
  Filled 2013-08-09: qty 1
  Filled 2013-08-09 (×3): qty 2
  Filled 2013-08-09: qty 1
  Filled 2013-08-09: qty 2

## 2013-08-09 MED ORDER — SODIUM CHLORIDE 0.9 % IV SOLN
INTRAVENOUS | Status: DC
  Filled 2013-08-09: qty 1

## 2013-08-09 MED ORDER — FUROSEMIDE 10 MG/ML IJ SOLN
40.0000 mg | Freq: Once | INTRAMUSCULAR | Status: AC
  Administered 2013-08-09: 40 mg via INTRAVENOUS
  Filled 2013-08-09: qty 4

## 2013-08-09 MED ORDER — ETOMIDATE 2 MG/ML IV SOLN
INTRAVENOUS | Status: DC | PRN
  Administered 2013-08-09: 8 mg via INTRAVENOUS

## 2013-08-09 MED ORDER — LACTATED RINGERS IV SOLN: 500.0000 mL | Freq: Once | INTRAVENOUS | Status: AC | PRN

## 2013-08-09 MED ORDER — HEPARIN SODIUM (PORCINE) 1000 UNIT/ML IJ SOLN
INTRAMUSCULAR | Status: DC | PRN
  Administered 2013-08-09: 15000 [IU] via INTRAVENOUS

## 2013-08-09 MED ORDER — SODIUM CHLORIDE 0.9 % IJ SOLN
INTRAMUSCULAR | Status: DC | PRN
  Administered 2013-08-09: 13:00:00

## 2013-08-09 MED ORDER — NITROGLYCERIN IN D5W 200-5 MCG/ML-% IV SOLN: 0.0000 ug/min | INTRAVENOUS | Status: DC

## 2013-08-09 MED ORDER — PANTOPRAZOLE SODIUM 40 MG PO TBEC
40.0000 mg | DELAYED_RELEASE_TABLET | Freq: Every day | ORAL | Status: DC
  Administered 2013-08-11: 40 mg via ORAL
  Filled 2013-08-09: qty 1

## 2013-08-09 MED ORDER — PHENYLEPHRINE HCL 10 MG/ML IJ SOLN
0.0000 ug/min | INTRAVENOUS | Status: DC
  Filled 2013-08-09: qty 2

## 2013-08-09 MED ORDER — VANCOMYCIN HCL IN DEXTROSE 1-5 GM/200ML-% IV SOLN
1000.0000 mg | Freq: Once | INTRAVENOUS | Status: AC
  Administered 2013-08-10: 1000 mg via INTRAVENOUS
  Filled 2013-08-09: qty 200

## 2013-08-09 MED ORDER — DEXMEDETOMIDINE HCL IN NACL 200 MCG/50ML IV SOLN
INTRAVENOUS | Status: DC | PRN
  Administered 2013-08-09: .3 ug/kg/h via INTRAVENOUS

## 2013-08-09 MED ORDER — ONDANSETRON HCL 4 MG/2ML IJ SOLN: 4.0000 mg | Freq: Four times a day (QID) | INTRAMUSCULAR | Status: DC | PRN

## 2013-08-09 MED ORDER — FENTANYL CITRATE 0.05 MG/ML IJ SOLN
INTRAMUSCULAR | Status: DC | PRN
  Administered 2013-08-09: 100 ug via INTRAVENOUS
  Administered 2013-08-09: 600 ug via INTRAVENOUS
  Administered 2013-08-09: 50 ug via INTRAVENOUS
  Administered 2013-08-09: 150 ug via INTRAVENOUS

## 2013-08-09 MED ORDER — IODIXANOL 320 MG/ML IV SOLN
INTRAVENOUS | Status: DC | PRN
  Administered 2013-08-09: 150 mL via INTRAVENOUS

## 2013-08-09 MED ORDER — PROTAMINE SULFATE 10 MG/ML IV SOLN
INTRAVENOUS | Status: DC | PRN
  Administered 2013-08-09: 150 mg via INTRAVENOUS

## 2013-08-09 MED ORDER — METOPROLOL TARTRATE 12.5 MG HALF TABLET
12.5000 mg | ORAL_TABLET | Freq: Two times a day (BID) | ORAL | Status: DC
  Administered 2013-08-10: 12.5 mg via ORAL
  Filled 2013-08-09 (×5): qty 1

## 2013-08-09 MED ORDER — INSULIN ASPART 100 UNIT/ML ~~LOC~~ SOLN
0.0000 [IU] | SUBCUTANEOUS | Status: DC
  Administered 2013-08-09 – 2013-08-11 (×3): 2 [IU] via SUBCUTANEOUS

## 2013-08-09 MED ORDER — CLOPIDOGREL BISULFATE 75 MG PO TABS
75.0000 mg | ORAL_TABLET | Freq: Every day | ORAL | Status: DC
  Administered 2013-08-10 – 2013-08-13 (×4): 75 mg via ORAL
  Filled 2013-08-09 (×5): qty 1

## 2013-08-09 MED ORDER — METOPROLOL TARTRATE 25 MG/10 ML ORAL SUSPENSION
12.5000 mg | Freq: Two times a day (BID) | ORAL | Status: DC
  Filled 2013-08-09 (×5): qty 5

## 2013-08-09 MED ORDER — TEMAZEPAM 15 MG PO CAPS
15.0000 mg | ORAL_CAPSULE | Freq: Once | ORAL | Status: AC | PRN
  Administered 2013-08-09: 15 mg via ORAL

## 2013-08-09 MED ORDER — SODIUM CHLORIDE 0.9 % IR SOLN
Status: DC | PRN
  Administered 2013-08-09: 13:00:00

## 2013-08-09 MED ORDER — ALBUMIN HUMAN 5 % IV SOLN
INTRAVENOUS | Status: DC | PRN
  Administered 2013-08-09: 13:00:00 via INTRAVENOUS

## 2013-08-09 MED ORDER — POTASSIUM CHLORIDE 10 MEQ/50ML IV SOLN
10.0000 meq | INTRAVENOUS | Status: AC
  Administered 2013-08-09 (×3): 10 meq via INTRAVENOUS

## 2013-08-09 MED ORDER — LIDOCAINE HCL (CARDIAC) 20 MG/ML IV SOLN
INTRAVENOUS | Status: DC | PRN
  Administered 2013-08-09: 100 mg via INTRAVENOUS
  Administered 2013-08-09 (×2): 50 mg via INTRAVENOUS

## 2013-08-09 MED ORDER — FAMOTIDINE IN NACL 20-0.9 MG/50ML-% IV SOLN
20.0000 mg | Freq: Two times a day (BID) | INTRAVENOUS | Status: AC
  Administered 2013-08-09: 20 mg via INTRAVENOUS
  Filled 2013-08-09: qty 50

## 2013-08-09 MED ORDER — ASPIRIN EC 325 MG PO TBEC
325.0000 mg | DELAYED_RELEASE_TABLET | Freq: Every day | ORAL | Status: DC
  Administered 2013-08-10 – 2013-08-11 (×2): 325 mg via ORAL
  Filled 2013-08-09 (×2): qty 1

## 2013-08-09 MED ORDER — SODIUM CHLORIDE 0.9 % IJ SOLN
3.0000 mL | Freq: Two times a day (BID) | INTRAMUSCULAR | Status: DC
  Administered 2013-08-10 – 2013-08-11 (×2): 3 mL via INTRAVENOUS

## 2013-08-09 MED ORDER — ALBUMIN HUMAN 5 % IV SOLN: 250.0000 mL | INTRAVENOUS | Status: AC | PRN

## 2013-08-09 MED ORDER — PHENYLEPHRINE HCL 10 MG/ML IJ SOLN
10.0000 mg | INTRAVENOUS | Status: DC | PRN
  Administered 2013-08-09: 10 ug/min via INTRAVENOUS
  Administered 2013-08-09: 50 ug/min via INTRAVENOUS

## 2013-08-09 MED ORDER — METOPROLOL TARTRATE 1 MG/ML IV SOLN: 2.5000 mg | INTRAVENOUS | Status: DC | PRN

## 2013-08-09 MED ORDER — ACETAMINOPHEN 160 MG/5ML PO SOLN: 650.0000 mg | Freq: Once | ORAL | Status: AC

## 2013-08-09 MED ORDER — MORPHINE SULFATE 2 MG/ML IJ SOLN
1.0000 mg | INTRAMUSCULAR | Status: AC | PRN
  Administered 2013-08-09: 2 mg via INTRAVENOUS
  Filled 2013-08-09: qty 1

## 2013-08-09 MED ORDER — INSULIN REGULAR BOLUS VIA INFUSION
0.0000 [IU] | Freq: Three times a day (TID) | INTRAVENOUS | Status: DC
  Filled 2013-08-09: qty 10

## 2013-08-09 MED ORDER — MIDAZOLAM HCL 2 MG/2ML IJ SOLN: 2.0000 mg | INTRAMUSCULAR | Status: DC | PRN

## 2013-08-09 MED ORDER — ACETAMINOPHEN 500 MG PO TABS
1000.0000 mg | ORAL_TABLET | Freq: Four times a day (QID) | ORAL | Status: DC
  Administered 2013-08-10 – 2013-08-11 (×7): 1000 mg via ORAL
  Filled 2013-08-09 (×8): qty 2

## 2013-08-09 MED ORDER — LACTATED RINGERS IV SOLN
INTRAVENOUS | Status: DC | PRN
  Administered 2013-08-09 (×2): via INTRAVENOUS

## 2013-08-09 MED ORDER — MIDAZOLAM HCL 5 MG/5ML IJ SOLN
INTRAMUSCULAR | Status: DC | PRN
  Administered 2013-08-09: 1 mg via INTRAVENOUS
  Administered 2013-08-09: 4 mg via INTRAVENOUS

## 2013-08-09 MED ORDER — MORPHINE SULFATE 2 MG/ML IJ SOLN
2.0000 mg | INTRAMUSCULAR | Status: DC | PRN
  Administered 2013-08-09: 4 mg via INTRAVENOUS
  Administered 2013-08-09 – 2013-08-10 (×2): 2 mg via INTRAVENOUS
  Administered 2013-08-10: 4 mg via INTRAVENOUS
  Administered 2013-08-10 – 2013-08-11 (×4): 2 mg via INTRAVENOUS
  Filled 2013-08-09: qty 2
  Filled 2013-08-09 (×3): qty 1
  Filled 2013-08-09: qty 2
  Filled 2013-08-09 (×3): qty 1

## 2013-08-09 MED ORDER — 0.9 % SODIUM CHLORIDE (POUR BTL) OPTIME
TOPICAL | Status: DC | PRN
  Administered 2013-08-09: 5000 mL

## 2013-08-09 SURGICAL SUPPLY — 130 items
ADAPTER CARDIOPLEGIA (MISCELLANEOUS) IMPLANT
ADH SKN CLS APL DERMABOND .7 (GAUZE/BANDAGES/DRESSINGS) ×2
ANTEGRADE CPLG (MISCELLANEOUS) IMPLANT
APL SKNCLS STERI-STRIP NONHPOA (GAUZE/BANDAGES/DRESSINGS) ×2
BAG BANDED W/RUBBER/TAPE 36X54 (MISCELLANEOUS) ×3 IMPLANT
BAG DECANTER FOR FLEXI CONT (MISCELLANEOUS) IMPLANT
BAG EQP BAND 135X91 W/RBR TAPE (MISCELLANEOUS) ×2
BAG SNAP BAND KOVER 36X36 (MISCELLANEOUS) ×9 IMPLANT
BENZOIN TINCTURE PRP APPL 2/3 (GAUZE/BANDAGES/DRESSINGS) ×3 IMPLANT
BLADE OSCILLATING /SAGITTAL (BLADE) ×3 IMPLANT
BLADE STERNUM SYSTEM 6 (BLADE) ×3 IMPLANT
BLADE SURG ROTATE 9660 (MISCELLANEOUS) ×3 IMPLANT
CABLE PACING FASLOC BIEGE (MISCELLANEOUS) ×3 IMPLANT
CABLE PACING FASLOC BLUE (MISCELLANEOUS) ×3 IMPLANT
CANISTER SUCTION 2500CC (MISCELLANEOUS) ×6 IMPLANT
CANNULA FEM VENOUS REMOTE 22FR (CANNULA) IMPLANT
CANNULA FEMORAL ART 14 SM (MISCELLANEOUS) IMPLANT
CANNULA GUNDRY RCSP 15FR (MISCELLANEOUS) IMPLANT
CANNULA OPTISITE PERFUSION 16F (CANNULA) IMPLANT
CANNULA OPTISITE PERFUSION 18F (CANNULA) IMPLANT
CANNULA SOFTFLOW AORTIC 7M21FR (CANNULA) IMPLANT
CANNULA VENOUS LOW PROF 34X46 (CANNULA) IMPLANT
CATH DIAG EXPO 6F VENT PIG 145 (CATHETERS) ×1 IMPLANT
CATH EXPO 5FR FR4 (CATHETERS) ×1 IMPLANT
CATH HEART VENT LEFT (CATHETERS) IMPLANT
CATH S G BIP PACING (SET/KITS/TRAYS/PACK) ×4 IMPLANT
CATH TROCAR 28FR (CATHETERS) ×1 IMPLANT
CLIP TI MEDIUM 6 (CLIP) ×3 IMPLANT
CLIP TI WIDE RED SMALL 6 (CLIP) ×3 IMPLANT
CONN ST 1/4X3/8  BEN (MISCELLANEOUS)
CONN ST 1/4X3/8 BEN (MISCELLANEOUS) IMPLANT
CONN Y 3/8X3/8X3/8  BEN (MISCELLANEOUS) ×1
CONN Y 3/8X3/8X3/8 BEN (MISCELLANEOUS) IMPLANT
CONNECTOR 1/2X3/8X1/2 3 WAY (MISCELLANEOUS)
CONNECTOR 1/2X3/8X1/2 3WAY (MISCELLANEOUS) IMPLANT
CONT SPEC 4OZ CLIKSEAL STRL BL (MISCELLANEOUS) ×1 IMPLANT
CONT SPECI 4OZ STER CLIK (MISCELLANEOUS) ×3 IMPLANT
COVER BACK TABLE 24X17X13 BIG (DRAPES) ×3 IMPLANT
COVER DOME SNAP 22 D (MISCELLANEOUS) ×3 IMPLANT
COVER PROBE W GEL 5X96 (DRAPES) IMPLANT
COVER SURGICAL LIGHT HANDLE (MISCELLANEOUS) ×3 IMPLANT
COVER TABLE BACK 60X90 (DRAPES) ×9 IMPLANT
CRADLE DONUT ADULT HEAD (MISCELLANEOUS) ×3 IMPLANT
DERMABOND ADVANCED (GAUZE/BANDAGES/DRESSINGS) ×1
DERMABOND ADVANCED .7 DNX12 (GAUZE/BANDAGES/DRESSINGS) ×2 IMPLANT
DRAIN CHANNEL 28F RND 3/8 FF (WOUND CARE) IMPLANT
DRAIN CHANNEL 32F RND 10.7 FF (WOUND CARE) IMPLANT
DRAPE INCISE IOBAN 66X45 STRL (DRAPES) ×1 IMPLANT
DRAPE SLUSH/WARMER DISC (DRAPES) ×1 IMPLANT
DRAPE SURG IRRIG POUCH 19X23 (DRAPES) ×3 IMPLANT
DRESSING OPSITE X SMALL 2X3 (GAUZE/BANDAGES/DRESSINGS) ×2 IMPLANT
ELECT BLADE 6.5 EXT (BLADE) ×1 IMPLANT
ELECT REM PT RETURN 9FT ADLT (ELECTROSURGICAL) ×6
ELECTRODE REM PT RTRN 9FT ADLT (ELECTROSURGICAL) ×4 IMPLANT
FELT TEFLON 6X6 (MISCELLANEOUS) ×3 IMPLANT
FEMORAL VENOUS CANN RAP (CANNULA) IMPLANT
FILTER STRAW FLUID ASPIR (MISCELLANEOUS) ×1 IMPLANT
GAUZE SPONGE 2X2 8PLY STRL LF (GAUZE/BANDAGES/DRESSINGS) IMPLANT
GLOVE BIOGEL M 6.5 STRL (GLOVE) IMPLANT
GLOVE BIOGEL M STER SZ 6 (GLOVE) IMPLANT
GLOVE ECLIPSE 7.5 STRL STRAW (GLOVE) ×3 IMPLANT
GLOVE ECLIPSE 8.0 STRL XLNG CF (GLOVE) ×6 IMPLANT
GLOVE EUDERMIC 7 POWDERFREE (GLOVE) ×6 IMPLANT
GLOVE ORTHO TXT STRL SZ7.5 (GLOVE) ×9 IMPLANT
GOWN PREVENTION PLUS XLARGE (GOWN DISPOSABLE) ×21 IMPLANT
GOWN STRL NON-REIN LRG LVL3 (GOWN DISPOSABLE) ×9 IMPLANT
GUIDEWIRE SAF TJ AMPL .035X180 (WIRE) ×4 IMPLANT
HEMOSTAT POWDER SURGIFOAM 1G (HEMOSTASIS) IMPLANT
KIT DILATOR VASC 18G NDL (KITS) IMPLANT
KIT HEART LEFT (KITS) ×1 IMPLANT
KIT ROOM TURNOVER OR (KITS) ×3 IMPLANT
KIT SUCTION CATH 14FR (SUCTIONS) ×9 IMPLANT
LEAD PACING MYOCARDI (MISCELLANEOUS) IMPLANT
NDL 18GX1X1/2 (RX/OR ONLY) (NEEDLE) IMPLANT
NDL PERC 18GX7CM (NEEDLE) ×2 IMPLANT
NEEDLE 18GX1X1/2 (RX/OR ONLY) (NEEDLE) ×3 IMPLANT
NEEDLE PERC 18GX7CM (NEEDLE) ×3 IMPLANT
NS IRRIG 1000ML POUR BTL (IV SOLUTION) ×21 IMPLANT
PACK AORTA (CUSTOM PROCEDURE TRAY) ×3 IMPLANT
PAD ARMBOARD 7.5X6 YLW CONV (MISCELLANEOUS) ×6 IMPLANT
PAD ELECT DEFIB RADIOL ZOLL (MISCELLANEOUS) ×3 IMPLANT
PATCH TACHOSII LRG 9.5X4.8 (VASCULAR PRODUCTS) IMPLANT
RETRACTOR PVM SOFT TISSUE M (INSTRUMENTS) ×1 IMPLANT
RETRACTOR TRL SOFT TISSUE LG (INSTRUMENTS) IMPLANT
RETRACTOR TRM SOFT TISSUE 7.5 (INSTRUMENTS) IMPLANT
SHEATH PINNACLE 6F 10CM (SHEATH) ×4 IMPLANT
SPONGE GAUZE 2X2 STER 10/PKG (GAUZE/BANDAGES/DRESSINGS) ×2
SPONGE GAUZE 4X4 12PLY (GAUZE/BANDAGES/DRESSINGS) ×3 IMPLANT
SPONGE LAP 4X18 X RAY DECT (DISPOSABLE) ×4 IMPLANT
STOPCOCK MORSE 400PSI 3WAY (MISCELLANEOUS) ×5 IMPLANT
SUT BONE WAX W31G (SUTURE) IMPLANT
SUT ETHIBOND 2 0 SH (SUTURE) ×3
SUT ETHIBOND 2 0 SH 36X2 (SUTURE) ×2 IMPLANT
SUT ETHIBOND X763 2 0 SH 1 (SUTURE) ×6 IMPLANT
SUT MNCRL AB 3-0 PS2 18 (SUTURE) ×6 IMPLANT
SUT PROLENE 2 0 MH 48 (SUTURE) ×12 IMPLANT
SUT PROLENE 4 0 RB 1 (SUTURE) ×3
SUT PROLENE 4-0 RB1 .5 CRCL 36 (SUTURE) ×2 IMPLANT
SUT SILK  1 MH (SUTURE) ×2
SUT SILK 1 MH (SUTURE) ×2 IMPLANT
SUT SILK 1 TIES 10X30 (SUTURE) ×4 IMPLANT
SUT SILK 2 0 FS (SUTURE) ×1 IMPLANT
SUT SILK 2 0 SH CR/8 (SUTURE) ×4 IMPLANT
SUT SILK 2 0SH CR/8 30 (SUTURE) IMPLANT
SUT TEM PAC WIRE 2 0 SH (SUTURE) IMPLANT
SUT VIC AB 1 CTX 36 (SUTURE) ×3
SUT VIC AB 1 CTX36XBRD ANBCTR (SUTURE) ×2 IMPLANT
SUT VIC AB 2-0 CTX 36 (SUTURE) ×6 IMPLANT
SUT VIC AB 3-0 SH 8-18 (SUTURE) ×6 IMPLANT
SUT VICRYL 2 TP 1 (SUTURE) IMPLANT
SYR 30ML LL (SYRINGE) ×6 IMPLANT
SYR 3ML LL SCALE MARK (SYRINGE) ×4 IMPLANT
SYR 50ML LL SCALE MARK (SYRINGE) ×3 IMPLANT
SYR 50ML SLIP (SYRINGE) ×1 IMPLANT
SYR MEDRAD MARK V 150ML (SYRINGE) ×3 IMPLANT
SYSTEM SAHARA CHEST DRAIN ATS (WOUND CARE) ×3 IMPLANT
TAPE CLOTH SURG 4X10 WHT LF (GAUZE/BANDAGES/DRESSINGS) ×1 IMPLANT
TAPE CLOTH SURG 6X10 WHT LF (GAUZE/BANDAGES/DRESSINGS) ×3 IMPLANT
TOWEL OR 17X24 6PK STRL BLUE (TOWEL DISPOSABLE) ×9 IMPLANT
TOWEL OR 17X26 10 PK STRL BLUE (TOWEL DISPOSABLE) ×6 IMPLANT
TRANSDUCER W/STOPCOCK (MISCELLANEOUS) ×2 IMPLANT
TRAY FOLEY IC TEMP SENS 14FR (CATHETERS) ×3 IMPLANT
TUBING ART PRESS 72  MALE/FEM (TUBING) ×1
TUBING ART PRESS 72 MALE/FEM (TUBING) IMPLANT
TUBING HIGH PRESSURE 120CM (CONNECTOR) ×5 IMPLANT
UNDERPAD 30X30 INCONTINENT (UNDERPADS AND DIAPERS) ×3 IMPLANT
VALVE HRT TRANSCATH ASCEND 29M (Valve) ×1 IMPLANT
VENT LEFT HEART 12002 (CATHETERS)
WATER STERILE IRR 1000ML POUR (IV SOLUTION) ×6 IMPLANT
WIRE .035 3MM-J 145CM (WIRE) ×1 IMPLANT

## 2013-08-09 NOTE — Op Note (Signed)
[facility]  TAVR OPERATIVE NOTE   Date of Procedure:  08/09/2013  Preoperative Diagnosis: Severe Aortic Stenosis   Postoperative Diagnosis: Same   Procedure:    Transcatheter Aortic Valve Replacement - Transapical Approach  Edwards Sapien XT THV (size 29 mm, model # 9300TFX, serial # [id number])   Co-Surgeons:  Evelene Croon, MD and Tonny Bollman, MD  Assistants:   Doree Fudge, PA-C  Anesthesiologist:  Kipp Brood, MD  Echocardiographer:  Marca Ancona, MD  Pre-operative Echo Findings:  Severe aortic stenosis and moderate AI  Severe global left ventricular systolic dysfunction   Post-operative Echo Findings:  No paravalvular leak  Stable LV dysfunction  BRIEF CLINICAL NOTE AND INDICATIONS FOR SURGERY  During the course of the patient's preoperative work up they have been evaluated comprehensively by a multidisciplinary team of specialists coordinated through the Multid[facility] in the [facility].  They have been demonstrated to suffer from symptomatic severe aortic stenosis as noted above. Her evaluation occurred during hospitalization as she has had severe NYHA Class IV CHF. The patient has been counseled extensively as to the relative risks and benefits of all options for the treatment of severe aortic stenosis including long term medical therapy, conventional surgery for aortic valve replacement, and transcatheter aortic valve replacement.  The patient has been independently evaluated by two cardiac surgeons including Dr Laneta Simmers and Dr. Tyrone Sage, and she is felt to be at high risk for conventional surgical aortic valve replacement based upon a predicted risk of mortality using the Society of Thoracic Surgeons risk calculator of 10%. Both surgeons indicated the patient would be a poor candidate for conventional surgery (predicted risk of mortality >15% and/or predicted risk of permanent morbidity >50%)  because of comorbidities including severe obstructive lung disease with FEV1 0.7 and severe LV dysfunction with ongoing NYHA Class IV CHF symptoms.   Based upon review of all of the patient's preoperative diagnostic tests they are felt to be candidate for transcatheter aortic valve replacement using the transapical approach as an alternative to high risk conventional surgery.    Following the decision to proceed with transcatheter aortic valve replacement, a discussion has been held regarding what types of management strategies would be attempted intraoperatively in the event of life-threatening complications, including whether or not the patient would be considered a candidate for the use of cardiopulmonary bypass and/or conversion to open sternotomy for attempted surgical intervention.  The patient has been advised of a variety of complications that might develop peculiar to this approach including but not limited to risks of death, stroke, paravalvular leak, aortic dissection or other major vascular complications, aortic annulus rupture, device embolization, cardiac rupture or perforation, acute myocardial infarction, arrhythmia, heart block or bradycardia requiring permanent pacemaker placement, congestive heart failure, respiratory failure, renal failure, pneumonia, infection, other late complications related to structural valve deterioration or migration, or other complications that might ultimately cause a temporary or permanent loss of functional independence or other long term morbidity.  The patient provides full informed consent for the procedure as described and all questions were answered preoperatively.    DETAILS OF THE OPERATIVE PROCEDURE  PREPARATION:    The patient is brought to the operating room on the above mentioned date and central monitoring was established by the anesthesia team including placement of Swan-Ganz catheter and radial arterial line. The patient is placed in the supine  position on the operating table.  Intravenous antibiotics are administered. General endotracheal anesthesia is induced  uneventfully. A Foley catheter is placed.  Baseline transesophageal echocardiogram was performed.    The patient's chest, abdomen, both groins, and both lower extremities are prepared and draped in a sterile manner. A time out procedure is performed.   PERIPHERAL ACCESS:    Using the modified Seldinger technique, femoral arterial and venous access was obtained with placement of 6 Fr sheaths bilaterally.  A pigtail diagnostic catheter was passed through the right femoral arterial sheath under fluoroscopic guidance into the aortic root.  A temporary transvenous pacemaker catheter was passed through the right femoral venous sheath under fluoroscopic guidance into the right ventricle.  The pacemaker was tested to ensure stable lead placement and pacemaker capture. Aortic root angiography was performed in order to determine the optimal angiographic angle for valve deployment (LAO 8 degrees). The left femoral arterial and venous sheaths were placed for access in case the patient needed to go on pump emergently.  TRANSAPICAL ACCESS:   PLEASE SEE THE NOTE OF DR Laneta Simmers FOR DETAILS OF TRANSAPICAL ACCESS.  BALLOON AORTIC VALVULOPLASTY: We elected not to perform BAV in order to minimize rapid ventricular pacing in this patient with severe LV dysfunction.  TRANSCATHETER HEART VALVE DEPLOYMENT:  An Edwards Sapien XT THV (size 29 mm) is prepared and crimped per manufacturer's guidelines, and the proper orientation of the valve is confirmed on the [employer]  3 delivery system. The delivery system loader is advanced into the introducing sheath. The valve and delivery system are advanced through the loader into the sheath and all air is evacuated. The valve and balloon are advanced into the left ventricle and part way through the aortic valve. The valve pusher is retracted. The valve is then  finely positioned in the aortic valve and its position verified using an injector aortogram through a pigtail catheter in the right Sinus of Valsalva. Valve position is also confirmed using TEE, and care is taken to make certain there is no sign of entanglement in the mitral apparatus. As soon as the valve was advanced across the native aortic valve, the patient became markedly hypotensive. The valve was withdrawn and the patient's blood pressure was allowed to recover. The valve was advanced again and carefully positioned. Aortic root angiography was performed under rapid pacing but there was poor visualization. With marked hypotension we deployed the valve and the patient's BP quickly improved post-deployment. The Ascendra delivery system and wire were removed after we confirmed a good result with no perivalvular AI post-deployment.   PROCEDURE COMPLETION:  Please see note of Dr Laneta Simmers for details of apical closure.   The patient tolerated the procedure well and is transported to the surgical intensive care in stable condition. There were no immediate intraoperative complications. All sponge instrument and needle counts are verified correct at completion of the operation.   The patient received a total of 50 mL of intravenous contrast during the procedure.  Tonny Bollman 08/09/2013 3:45 PM

## 2013-08-09 NOTE — OR Nursing (Signed)
Second call to SICU 

## 2013-08-09 NOTE — Anesthesia Procedure Notes (Addendum)
Procedure Name: Intubation Date/Time: 08/09/2013 11:56 AM Performed by: Arlice Colt B Pre-anesthesia Checklist: Patient identified, Emergency Drugs available, Suction available, Patient being monitored and Timeout performed Patient Re-evaluated:Patient Re-evaluated prior to inductionOxygen Delivery Method: Circle system utilized Preoxygenation: Pre-oxygenation with 100% oxygen Intubation Type: IV induction Ventilation: Two handed mask ventilation required and Oral airway inserted - appropriate to patient size Laryngoscope Size: Hyacinth Meeker and 2 Grade View: Grade I Tube type: Oral Tube size: 8.0 mm Number of attempts: 1 Airway Equipment and Method: Stylet and Oral airway Placement Confirmation: ETT inserted through vocal cords under direct vision,  positive ETCO2,  CO2 detector and breath sounds checked- equal and bilateral Secured at: 22 cm Tube secured with: Tape Dental Injury: Teeth and Oropharynx as per pre-operative assessment     PA catheter:  Routine monitors. Timeout, sterile prep, drape, FBP R neck.  Trendelenburg position.  1% Lido local, finder and trocar RIJ 1st pass with US guidance.  2-lumen Cordis placed over J wire. PA catheter in easily.  Sterile dressing applied.  Patient tolerated well, VSS.  Sandford Craze, MD  10:45-10:57

## 2013-08-09 NOTE — Anesthesia Preprocedure Evaluation (Addendum)
Anesthesia Evaluation  Patient identified by MRN, date of birth, ID band Patient awake    Reviewed: Allergy & Precautions, H&P , NPO status , Patient's Chart, lab work & pertinent test results, reviewed documented beta blocker date and time   History of Anesthesia Complications Negative for: history of anesthetic complications  Airway Mallampati: II TM Distance: >3 FB Neck ROM: Full    Dental  (+) Poor Dentition and Dental Advisory Given   Pulmonary COPD COPD inhaler, Current Smoker,  breath sounds clear to auscultation        Cardiovascular Exercise Tolerance: Poor - angina+CHF (EF 25-30%) + Valvular Problems/Murmurs (ECHO: AVA 0.61 with mean gradient 47mm Hg ) AS Rhythm:Regular Rate:Normal + Systolic murmurs (IV/VI)    Neuro/Psych negative neurological ROS     GI/Hepatic negative GI ROS, (+)     substance abuse  alcohol use, Elevated LFTs with recent cardiogenic shock   Endo/Other  negative endocrine ROS  Renal/GU negative Renal ROS     Musculoskeletal   Abdominal   Peds  Hematology   Anesthesia Other Findings   Reproductive/Obstetrics                         Anesthesia Physical Anesthesia Plan  ASA: IV  Anesthesia Plan: General   Post-op Pain Management:    Induction: Intravenous  Airway Management Planned: Oral ETT  Additional Equipment: Arterial line, CVP, PA Cath, 3D TEE and Ultrasound Guidance Line Placement  Intra-op Plan:   Post-operative Plan: Post-operative intubation/ventilation  Informed Consent: I have reviewed the patients History and Physical, chart, labs and discussed the procedure including the risks, benefits and alternatives for the proposed anesthesia with the patient or authorized representative who has indicated his/her understanding and acceptance.   Dental advisory given  Plan Discussed with: CRNA and Surgeon  Anesthesia Plan Comments: (Plan routine  monitors, A-line, PA cath, GETA with TEE and post-op ventilation )        Anesthesia Quick Evaluation

## 2013-08-09 NOTE — Procedures (Signed)
Extubation Procedure Note  Patient Details:   Name: Cathy Moody DOB: [birth year] MRN: [id number]   Airway Documentation:  Patient extubated following rapid wean protocol. Pre extubation: .ABG pH 7.35, CO2 57, PO2 59, NIF -26, VC 800. Pt able to follow all commands. Post extubation: Pt placed on 6L  Sats 88% (Sats 88% ok by MD) Pt able to verbalize name/location. No Stridor. Clear BBS. RT will continue to monitor.   Evaluation  O2 sats: stable throughout Complications: No apparent complications Patient did tolerate procedure well. Bilateral Breath Sounds: Clear;Diminished (coarse)   Yes  Elmer Picker 08/09/2013, 7:38 PM

## 2013-08-09 NOTE — Transfer of Care (Signed)
Immediate Anesthesia Transfer of Care Note  Patient: Cathy Moody  Procedure(s) Performed: Procedure(s): TRANSCATHETER AORTIC VALVE REPLACEMENT, TRANSAPICAL (Left) INTRAOPERATIVE TRANSESOPHAGEAL ECHOCARDIOGRAM (N/A)  Patient Location: ICU  Anesthesia Type:General  Level of Consciousness: sedated and Patient remains intubated per anesthesia plan  Airway & Oxygen Therapy: Patient remains intubated per anesthesia plan and Patient placed on Ventilator (see vital sign flow sheet for setting)  Post-op Assessment: Report given to PACU RN  Post vital signs: Reviewed and stable  Complications: No apparent anesthesia complications

## 2013-08-09 NOTE — OR Nursing (Signed)
1st call to SICU 

## 2013-08-09 NOTE — Progress Notes (Signed)
SICU vent wean protocol initiated.  RN aware

## 2013-08-09 NOTE — Op Note (Signed)
CARDIOTHORACIC SURGERY OPERATIVE NOTE  Date of Procedure:  08/09/2013  Preoperative Diagnosis: Severe Aortic Stenosis   Postoperative Diagnosis: Same   Procedure:    Transcatheter Aortic Valve Replacement - Transapical Approach  Edwards Sapien THV (size 29 mm, model # 9300TFX, serial # [id number])   Co-Surgeons:  Alleen Borne, MD and Tonny Bollman, MD  Assistants:   Doree Fudge, PA-C Anesthesiologist:  Kipp Brood, MD  Echocardiographer:  Marca Ancona, MD  Pre-operative Echo Findings:  severe aortic stenosis  severe left ventricular systolic function  Moderate to severe AI  Post-operative Echo Findings:  no paravalvular leak  stable left ventricular systolic function  Trivial MR   BRIEF CLINICAL NOTE AND INDICATIONS FOR SURGERY  The patient is a 62 year old white female with a long history of heavy smoking, chronic shortness of breath for years probably related to COPD, who also has a long history of a heart murmur. She reports at least a 6 week history of worsening shortness of breath with exertion that has progressed to the point that she is getting short of breath going to the bathroom. She has also developed marked swelling in her legs. On admission her BNP was 45409. Initial Troponin I poc was .07 and subsequent follow-up levels were < 0.3. A 2 D echo showed severe calcific AS with an AVA of 0.61 cm2 by VTI and 0.49 cm2 by Vmax. The mean gradient was 47 mm Hg with a peak gradient of 80 mm Hg. Her EF is severely reduced at 25% with diffuse hypokinesis and mild to moderate MR. Systolic PAP was severely elevated at 64. The LVOT diameter was only 19 mm. She underwent a R&L heart cath showing no coronary disease, severe pulmonary HTN with PA 65/31 with a PCWP mean of 31, RA of 14, and CO of 3.07 L/min with a BSA of 1.8. Her chest CT scan scan showed a dilated ascending aorta measuring 4.5 cm. I think she would be at extremely high risk for open surgical AVR and even  higher risk for Bent[place] due to low EF, severe COPD with PCO2 60, pulmonary HTN, morbid obesity. She does have a 4.5 cm ascending aorta but that could be left alone because her long term prognosis for survival is low. I think TAVR would be the best option if possible.  During the course of the patient's preoperative work up they have been evaluated comprehensively by a multidisciplinary team of specialists coordinated through the Multidisciplinary Heart Valve Clinic in the [facility]. They have been demonstrated to suffer from symptomatic severe aortic stenosis as noted above. The patient has been counseled extensively as to the relative risks and benefits of all options for the treatment of severe aortic stenosis including long term medical therapy, conventional surgery for aortic valve replacement, and transcatheter aortic valve replacement. The patient has been independently evaluated by two cardiac surgeons including myself and Dr. Sheliah Plane, and Dr. Tonny Bollman from Interventional Cardiology. They all felt her to be at extremely high risk for conventional surgical aortic valve replacement based upon a predicted risk of mortality using the Society of Thor[employer] Surgeons risk calculator of 9.9% and all of [place] feel that the patient would be a poor candidate for conventional surgery (predicted risk of mortality >15% and/or predicted risk of permanent morbidity >50%) because of comorbidities including morbid obesity, severe COPD with an FEV1 of 0.78 with CO2 retention and a PCO2 of 60, low EF of 25%.  Based upon review  of all of the patient's preoperative diagnostic tests they are felt to be candidate for transcatheter aortic valve replacement using the transapical approach as an alternative to high risk conventional surgery. Her ascending aorta is aneurysmal with a diameter of 4.5 cm and I think a transaortic approach would be more likely to result in aortic complications. In  addition her ascending aorta is relatively short due to her body habitus and it may not be possible to get adequate working length above the valve. Although her LV function is severely depressed I think the risk of a transapical approach is less.  Following the decision to proceed with transcatheter aortic valve replacement, a discussion has been held regarding what types of management strategies would be attempted intraoperatively in the event of life-threatening complications, including whether or not the patient would be considered a candidate for the use of cardiopulmonary bypass and/or conversion to open sternotomy for attempted surgical intervention. The patient has been advised of a variety of complications that might develop including but not limited to risks of death, stroke, paravalvular leak, aortic dissection or other major vascular complications, aortic annulus rupture, device embolization, cardiac rupture or perforation, mitral regurgitation, acute myocardial infarction, arrhythmia, heart block or bradycardia requiring permanent pacemaker placement, congestive heart failure, respiratory failure, renal failure, pneumonia, infection, other late complications related to structural valve deterioration or migration, or other complications that might ultimately cause a temporary or permanent loss of functional independence or other long term morbidity. I told her and her family that I would put her on CPB through the groin if needed for hemodynamic stabilization and would perform a sternotomy if I felt there was a surgically correctable problem requiring that. The patient provides full informed consent for the procedure as described and all questions were answered.       DETAILS OF THE OPERATIVE PROCEDURE  PREPARATION:    The patient is brought to the operating room on the above mentioned date and central monitoring was established by the anesthesia team including placement of Swan-Ganz catheter  and radial arterial line. She had severe pulmonary HTN with PAP of 75/40. The patient is placed in the supine position on the operating table.  Intravenous antibiotics are administered. General endotracheal anesthesia is induced uneventfully. A Foley catheter is placed.  Baseline transesophageal echocardiogram was performed.  Findings were notable for severe aortic stenosis, moderate to severe AI,  severe LV dysfunction.   The patient's chest, abdomen, both groins, and both lower extremities are prepared and draped in a sterile manner. A time out procedure is performed.   PERIPHERAL ACCESS:    Bilateral femoral arterial and venous access is obtained with placement of 6 Fr arterial sheaths on both sides.  A pigtail diagnostic catheter is passed through the right femoral arterial sheath under fluoroscopic guidance into the aortic root.  A temporary transvenous pacemaker catheter is passed through the right femoral venous sheath under fluoroscopic guidance into the right ventricle.  The pacemaker is tested to ensure stable lead placement and pacemaker capture.   TRANSAPICAL ACCESS:   The location of the left ventricular apex is confirmed using flouroscopy, and a miniature left thoracotomy incision is made directly over the left ventricular apex.  The left pleural space is entered.  No adhesions are encountered.  A soft tissue retractor is placed and the ribs gently spread to expose the pericardial surface.  A longitudinal incision is made in the pericardium and the left ventricular apex inspected.  There are no intrapericardial adhesions.  An appropriate site for left ventricular sheath placement is chosen well lateral to the left anterior descending coronary artery and verified using TEE.  Two pursestring sutures are placed using pledgeted 2-0 Prolene suture in a an overlapping triangular orientation.   The patient is heparinized systemically and ACT verified > 250 seconds.  The left ventricular apex  is punctured using an  18 gauge needle and a soft J-tipped guidewire is passed into the left ventricle and through the aortic valve under fluoroscopic guidance while also continuously monitoring TEE for signs of entrapment in the mitral apparatus.  A 6 Fr sheath is placed over the guidewire and across the aortic valve.  A JR-4 catheter is passed through the sheath and maneuvered around the aortic arch into the descending thoracic aorta under fluoroscopic guidance.  An Amplatz extra stiff guidewire is passed through the JR-4 catheter into the descending thoracic aorta and both the JR-4 catheter and the introducing sheath are removed.  An Edwards Ascendra plus introducer sheath is passed over the guidewire into the left ventricle.  The sheath position is continuously monitored and secured by me.  Baseline simultaneous left ventricular and aortic pressures are recorded.   BALLOON AORTIC VALVULOPLASTY:  Dr. Excell Seltzer and I discussed doing a balloon valvuloplasty and decided not to in order to minimize the runs of rapid ventricular pacing. With her severe LV dysfunction and severe pulmonary HTN we were concerned that she may not tolerate this.  TRANSCATHETER HEART VALVE DEPLOYMENT:  This will be dictated by Dr. Excell Seltzer.  PROCEDURE COMPLETION:  The left ventricular sheath is removed during rapid ventricular pacing to maintain systolic blood pressure < 70 mmHg while the pursestring sutures are tied.  The apical closure is inspected and notably hemostatic.  Protamine is administered.    Once hemostasis has been ascertained, the left pleural space is drained using a single 32 Fr Bard chest tube and the mini thoracotomy incision is closed in layers and the skin incision closed using a subcuticular skin closure. The temporary pacemaker, pigtail catheters and all femoral sheaths are removed.  The patient tolerated the procedure well and is transported to the surgical intensive care in stable condition. There are no  intraoperative complications. All sponge instrument and needle counts are verified correct at completion of the operation.  No blood products were administered during the operation.    Alleen Borne,  MD 08/09/2013 3:23 PM

## 2013-08-09 NOTE — Anesthesia Postprocedure Evaluation (Signed)
  Anesthesia Post-op Note  Patient: Cathy Moody  Procedure(s) Performed: Procedure(s): TRANSCATHETER AORTIC VALVE REPLACEMENT, TRANSAPICAL (Left) INTRAOPERATIVE TRANSESOPHAGEAL ECHOCARDIOGRAM (N/A)  Patient Location: SICU  Anesthesia Type:General  Level of Consciousness: sedated, unresponsive and Patient remains intubated per anesthesia plan  Airway and Oxygen Therapy: Patient remains intubated per anesthesia plan and Patient placed on Ventilator (see vital sign flow sheet for setting)  Post-op Pain: none  Post-op Assessment: Post-op Vital signs reviewed, Patient's Cardiovascular Status Stable and Respiratory Function Stable  Post-op Vital Signs: stable  Complications: No apparent anesthesia complications

## 2013-08-09 NOTE — Progress Notes (Deleted)
Echocardiogram 2D Echocardiogram has been performed.  Dorothey Baseman 08/09/2013, 2:07 PM

## 2013-08-09 NOTE — Brief Op Note (Signed)
07/29/2013 - 08/09/2013  2:53 PM  PATIENT:  Cathy Moody  62 y.o. female  PRE-OPERATIVE DIAGNOSIS:  SEVERE AS  POST-OPERATIVE DIAGNOSIS:  severe aortic stenosis  PROCEDURE:  Procedure(s): TRANSCATHETER AORTIC VALVE REPLACEMENT, TRANSAPICAL (Left) INTRAOPERATIVE TRANSESOPHAGEAL ECHOCARDIOGRAM (N/A)  SURGEON:  Surgeon(s) and Role:    * Alleen Borne, MD - Co-Surgeon    * Tonny Bollman, MD - Co-Surgeon    PHYSICIAN ASSISTANT: Doree Fudge, PA-C  ASSISTANTS: none   ANESTHESIA:   general  EBL:  Total I/O In: 1250 [I.V.:1000; IV Piggyback:250] Out: 225 [Urine:100; Blood:125]  BLOOD ADMINISTERED:none  DRAINS: (32 F) Blake drain(s) in the right and left pleural space.   LOCAL MEDICATIONS USED:  NONE  COUNTS:  YES  TOURNIQUET:  * No tourniquets in log *  DICTATION: .Note written in EPIC  PLAN OF CARE: Admit to inpatient   PATIENT DISPOSITION:  ICU - intubated and hemodynamically stable.   Delay start of Pharmacological VTE agent (>24hrs) due to surgical blood loss or risk of bleeding: yes

## 2013-08-09 NOTE — Progress Notes (Signed)
  Echocardiogram Echocardiogram Transesophageal has been performed.  Cathy Moody 08/09/2013, 2:07 PM

## 2013-08-09 NOTE — Progress Notes (Signed)
Patient ID: Cathy Moody, female   DOB: [birth year], 61 y.o.   MRN: [id number]   SICU Evening Rounds:   Hemodynamically stable  CI = 1.83  Awake on vent. Moves all extremities to command. Weaning to extubate  Urine output good with lasix  CT output low  CBC    Component Value Date/Time   WBC 11.3* 08/09/2013 1500   RBC 3.42* 08/09/2013 1500   HGB 12.6 08/09/2013 1504   HCT 37.0 08/09/2013 1504   PLT 155 08/09/2013 1500   MCV 101.5* 08/09/2013 1500   MCH 33.6 08/09/2013 1500   MCHC 33.1 08/09/2013 1500   RDW 13.9 08/09/2013 1500     BMET    Component Value Date/Time   NA 135 08/09/2013 1504   K 3.3* 08/09/2013 1504   CL 91* 08/09/2013 0415   CO2 37* 08/09/2013 0415   GLUCOSE 136* 08/09/2013 1504   BUN 24* 08/09/2013 0415   CREATININE 0.97 08/09/2013 0415   CALCIUM 8.6 08/09/2013 0415   GFRNONAA 62* 08/09/2013 0415   GFRAA 72* 08/09/2013 0415     A/P:  Stable postop course. Continue current plans.

## 2013-08-09 NOTE — Progress Notes (Signed)
0850 Pt getting prepared for surgery. We will follow up after pt to 2W. Luetta Nutting RN BSN 08/09/2013 8:48 AM

## 2013-08-10 ENCOUNTER — Other Ambulatory Visit: Payer: Self-pay | Admitting: *Deleted

## 2013-08-10 ENCOUNTER — Inpatient Hospital Stay (HOSPITAL_COMMUNITY): Payer: BC Managed Care – PPO

## 2013-08-10 DIAGNOSIS — I359 Nonrheumatic aortic valve disorder, unspecified: Secondary | ICD-10-CM

## 2013-08-10 DIAGNOSIS — I369 Nonrheumatic tricuspid valve disorder, unspecified: Secondary | ICD-10-CM

## 2013-08-10 LAB — CREATININE, SERUM
Creatinine, Ser: 0.79 mg/dL (ref 0.50–1.10)
GFR calc non Af Amer: 88 mL/min — ABNORMAL LOW (ref >90)

## 2013-08-10 LAB — GLUCOSE, CAPILLARY
Glucose-Capillary: 113 mg/dL — ABNORMAL HIGH (ref 70–99)
Glucose-Capillary: 93 mg/dL (ref 70–99)
Glucose-Capillary: 101 mg/dL — ABNORMAL HIGH (ref 70–99)
Glucose-Capillary: 160 mg/dL — ABNORMAL HIGH (ref 70–99)

## 2013-08-10 LAB — POCT I-STAT, CHEM 8
Calcium, Ion: 1.14 mmol/L (ref 1.13–1.30)
HCT: 41 % (ref 36.0–46.0)
Hemoglobin: 13.9 g/dL (ref 12.0–15.0)
Potassium: 3.8 mEq/L (ref 3.5–5.1)
Sodium: 137 mEq/L (ref 135–145)
TCO2: 38 mmol/L (ref 0–100)

## 2013-08-10 LAB — CBC
HCT: 39.4 % (ref 36.0–46.0)
Hemoglobin: 12.1 g/dL (ref 12.0–15.0)
Hemoglobin: 12.6 g/dL (ref 12.0–15.0)
MCHC: 32 g/dL (ref 30.0–36.0)
Platelets: 169 10*3/uL (ref 150–400)
Platelets: 162 10*3/uL (ref 150–400)
RBC: 3.69 MIL/uL — ABNORMAL LOW (ref 3.87–5.11)
RBC: 3.82 MIL/uL — ABNORMAL LOW (ref 3.87–5.11)
WBC: 12.1 10*3/uL — ABNORMAL HIGH (ref 4.0–10.5)
WBC: 14.2 10*3/uL — ABNORMAL HIGH (ref 4.0–10.5)

## 2013-08-10 LAB — BASIC METABOLIC PANEL
CO2: 37 mEq/L — ABNORMAL HIGH (ref 19–32)
Chloride: 96 mEq/L (ref 96–112)
Glucose, Bld: 114 mg/dL — ABNORMAL HIGH (ref 70–99)
Sodium: 136 mEq/L (ref 135–145)

## 2013-08-10 LAB — MAGNESIUM
Magnesium: 2.4 mg/dL (ref 1.5–2.5)
Magnesium: 2 mg/dL (ref 1.5–2.5)

## 2013-08-10 MED ORDER — INSULIN ASPART 100 UNIT/ML ~~LOC~~ SOLN: 0.0000 [IU] | SUBCUTANEOUS | Status: DC

## 2013-08-10 MED ORDER — FUROSEMIDE 10 MG/ML IJ SOLN
40.0000 mg | Freq: Once | INTRAMUSCULAR | Status: AC
  Administered 2013-08-10: 40 mg via INTRAVENOUS
  Filled 2013-08-10: qty 4

## 2013-08-10 MED ORDER — GUAIFENESIN ER 600 MG PO TB12
600.0000 mg | ORAL_TABLET | Freq: Two times a day (BID) | ORAL | Status: DC
  Administered 2013-08-10 – 2013-08-13 (×6): 600 mg via ORAL
  Filled 2013-08-10 (×8): qty 1

## 2013-08-10 MED ORDER — KETOROLAC TROMETHAMINE 15 MG/ML IJ SOLN: 15.0000 mg | Freq: Four times a day (QID) | INTRAMUSCULAR | Status: DC | PRN

## 2013-08-10 MED ORDER — ALPRAZOLAM 0.25 MG PO TABS
0.5000 mg | ORAL_TABLET | Freq: Three times a day (TID) | ORAL | Status: DC | PRN
  Administered 2013-08-10 – 2013-08-11 (×3): 0.5 mg via ORAL
  Filled 2013-08-10: qty 2
  Filled 2013-08-10 (×2): qty 1

## 2013-08-10 MED ORDER — FUROSEMIDE 10 MG/ML IJ SOLN
40.0000 mg | Freq: Once | INTRAMUSCULAR | Status: AC
  Administered 2013-08-10: 40 mg via INTRAVENOUS

## 2013-08-10 MED FILL — Norepinephrine Bitartrate IV Soln 1 MG/ML (Base Equivalent): INTRAMUSCULAR | Qty: 8 | Status: AC

## 2013-08-10 MED FILL — Dexmedetomidine HCl IV Soln 200 MCG/2ML: INTRAVENOUS | Qty: 2 | Status: AC

## 2013-08-10 MED FILL — Heparin Sodium (Porcine) Inj 1000 Unit/ML: INTRAMUSCULAR | Qty: 30 | Status: AC

## 2013-08-10 MED FILL — Magnesium Sulfate Inj 50%: INTRAMUSCULAR | Qty: 10 | Status: AC

## 2013-08-10 MED FILL — Potassium Chloride Inj 2 mEq/ML: INTRAVENOUS | Qty: 40 | Status: AC

## 2013-08-10 MED FILL — Dextrose Inj 5%: INTRAVENOUS | Qty: 250 | Status: AC

## 2013-08-10 NOTE — Progress Notes (Signed)
CT surgery p.m. Rounds  Doing well after TAVR Out of bed to chair, O2 sats stable on nasal cannula P.m. labs reviewed and are satisfactory Normal sinus rhythm

## 2013-08-10 NOTE — Progress Notes (Signed)
  Echocardiogram 2D Echocardiogram has been performed.  Lillianah Swartzentruber FRANCES 08/10/2013, 3:58 PM

## 2013-08-10 NOTE — Progress Notes (Signed)
    Subjective:  Patient with expected postoperative pain, worse with inspiration. No substernal chest discomfort. Shortness of breath noted but stable  Objective:  Vital Signs in the last 24 hours: Temp:  [96.1 F (35.6 C)-98.8 F (37.1 C)] 98.8 F (37.1 C) (12/17 0800) Pulse Rate:  [46-75] 67 (12/17 0800) Resp:  [12-27] 19 (12/17 0800) BP: (96-131)/(51-67) 116/64 mmHg (12/17 0800) SpO2:  [87 %-99 %] 94 % (12/17 0927) Arterial Line BP: (87-135)/(45-65) 127/58 mmHg (12/17 0800) FiO2 (%):  [40 %-50 %] 40 % (12/16 1900) Weight:  [185 lb 6.5 oz (84.1 kg)-186 lb 4.6 oz (84.5 kg)] 185 lb 6.5 oz (84.1 kg) (12/17 0500)  Intake/Output from previous day: 12/16 0701 - 12/17 0700 In: 3764.9 [P.O.:150; I.V.:2714.9; IV Piggyback:900] Out: 3525 [Urine:2780; Blood:125; Chest Tube:620]  Physical Exam: Pt is alert and oriented, NAD HEENT: normal Neck: JVP - elevated Lungs: Diminished breath sounds throughout CV: RRR without murmur Abd: soft, NT, Positive BS, no hepatomegaly Ext: Bilateral groin access sites are stable without hematoma or ecchymosis. There is 2+ ankle edema on the right and 1+ edema on the left Skin: warm/dry no rash   Lab Results:  Recent Labs  08/09/13 2100 08/09/13 2154 08/10/13 0405  WBC 14.6*  --  12.1*  HGB 12.6 13.9 12.1  PLT 178  --  169    Recent Labs  08/09/13 0415  08/09/13 2154 08/10/13 0405  NA 135  < > 136 136  K 3.8  < > 4.4 4.5  CL 91*  --  90* 96  CO2 37*  --   --  37*  GLUCOSE 110*  < > 126* 114*  BUN 24*  --  19 18  CREATININE 0.97  < > 1.00 0.76  < > = values in this interval not displayed. No results found for this basename: TROPONINI, CK, MB,  in the last 72 hours  Cardiac Studies: 2-D echocardiogram pending  Tele: Sinus rhythm  Assessment/Plan:  1. Severe aortic stenosis status post TAVR postop day #1 2. 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 Severe COPD 4. Obesity  Overall I think the patient is  doing well postoperative day 1 considering her severe preoperative cardiopulmonary impairment. Agree with IV diuresis to treat heart failure and volume overload. Echocardiogram to be done today. Plavix has been started.  Tonny Bollman, M.D. 08/10/2013, 9:59 AM

## 2013-08-10 NOTE — Progress Notes (Signed)
1 Day Post-Op Procedure(s) (LRB): TRANSCATHETER AORTIC VALVE REPLACEMENT, TRANSAPICAL (Left) INTRAOPERATIVE TRANSESOPHAGEAL ECHOCARDIOGRAM (N/A) Subjective  Complains of pain  Objective: Vital signs in last 24 hours: Temp:  [96.1 F (35.6 C)-98.8 F (37.1 C)] 98.2 F (36.8 C) (12/17 1111) Pulse Rate:  [46-67] 67 (12/17 0800) Cardiac Rhythm:  [-] Normal sinus rhythm (12/17 0800) Resp:  [12-27] 19 (12/17 0800) BP: (96-131)/(51-67) 116/64 mmHg (12/17 0800) SpO2:  [87 %-99 %] 94 % (12/17 0927) Arterial Line BP: (87-135)/(45-65) 127/58 mmHg (12/17 0800) FiO2 (%):  [40 %-50 %] 40 % (12/16 1900) Weight:  [185 lb 6.5 oz (84.1 kg)-186 lb 4.6 oz (84.5 kg)] 185 lb 6.5 oz (84.1 kg) (12/17 0500)  Hemodynamic parameters for last 24 hours: PAP: (37-53)/(9-23) 47/16 mmHg CO:  [2.8 L/min-4.2 L/min] 3 L/min CI:  [1.6 L/min/m2-3 L/min/m2] 1.8 L/min/m2  Intake/Output from previous day: 12/16 0701 - 12/17 0700 In: 3764.9 [P.O.:150; I.V.:2714.9; IV Piggyback:900] Out: 3525 [Urine:2780; Blood:125; Chest Tube:620] Intake/Output this shift: Total I/O In: 327.5 [P.O.:240; I.V.:87.5] Out: 535 [Urine:385; Chest Tube:150]  General appearance: alert, cooperative and anxious Neurologic: intact Heart: regular rate and rhythm, S1, S2 normal, no murmur, click, rub or gallop Lungs: diminished breath sounds bilaterally Abdomen: soft, non-tender; bowel sounds normal; no masses,  no organomegaly Extremities: edema minimal Wound: incision ok  Lab Results:  Recent Labs  08/09/13 2100 08/09/13 2154 08/10/13 0405  WBC 14.6*  --  12.1*  HGB 12.6 13.9 12.1  HCT 39.1 41.0 38.1  PLT 178  --  169   BMET:  Recent Labs  08/09/13 0415  08/09/13 2154 08/10/13 0405  NA 135  < > 136 136  Moody 3.8  < > 4.4 4.5  CL 91*  --  90* 96  CO2 37*  --   --  37*  GLUCOSE 110*  < > 126* 114*  BUN 24*  --  19 18  CREATININE 0.97  < > 1.00 0.76  CALCIUM 8.6  --   --  8.2*  < > = values in this interval not  displayed.  PT/INR:  Recent Labs  08/09/13 1500  LABPROT 17.5*  INR 1.48   ABG    Component Value Date/Time   PHART 7.351 08/09/2013 2105   HCO3 36.0* 08/09/2013 2105   TCO2 36 08/09/2013 2154   O2SAT 94.0 08/09/2013 2105   CBG (last 3)   Recent Labs  08/10/13 0327 08/10/13 0717 08/10/13 1108  GLUCAP 113* 93 101*   CLINICAL DATA: Post transcatheter aortic valve replacements  EXAM:  PORTABLE CHEST - 1 VIEW  COMPARISON: 08/09/2013; 08/03/2013 ; 07/29/2013  FINDINGS:  Grossly unchanged enlarged cardiac silhouette and mediastinal  contours given decreased lung volumes and patient rotation. Post  transcatheter aortic valve replacement. Interval extubation and  removal of enteric tube. Otherwise, stable position of support  apparatus. No definite pneumothorax. Decreased lung volumes with  worsening bilateral mid and lower lung heterogeneous opacities. Mild  cephalization of flow without definite evidence of edema. Trace  bilateral effusions are not excluded. Unchanged bones.  IMPRESSION:  1. Interval extubation and removal of enteric tube. Otherwise,  stable positioning of remaining support apparatus.  2. Decreased lung volumes with worsening bibasilar opacities, likely  atelectasis.  3. Mild cephalization of flow without definite evidence of edema.  Electronically Signed  By: Simonne Come M.D.  On: 08/10/2013 08:06   Assessment/Plan: S/P Procedure(s) (LRB): TRANSCATHETER AORTIC VALVE REPLACEMENT, TRANSAPICAL (Left) INTRAOPERATIVE TRANSESOPHAGEAL ECHOCARDIOGRAM (N/A) Overall she is doing fairly well considering her severe  COPD. Continue to work on IS, coughing, and deep breathing. Continue bronchodilator Mobilize Diuresis Diabetes control Continue foley due to diuresing patient and patient in ICU See progression orders Will keep chest tubes in today to prevent pleural effusions. ASA and Plavix 2D echo today   LOS: 12 days    Cathy Moody 08/10/2013

## 2013-08-10 NOTE — Progress Notes (Signed)
Upon entering room, noted RN placing NRB on pt d/t pt desat 80% on VM.  SAts immed increased to 93-95% on NRB.  No distress noted.

## 2013-08-11 ENCOUNTER — Encounter (HOSPITAL_COMMUNITY): Payer: Self-pay | Admitting: Surgery

## 2013-08-11 ENCOUNTER — Inpatient Hospital Stay (HOSPITAL_COMMUNITY): Payer: BC Managed Care – PPO

## 2013-08-11 LAB — CBC
MCH: 32.6 pg (ref 26.0–34.0)
MCHC: 31.4 g/dL (ref 30.0–36.0)
MCV: 103.8 fL — ABNORMAL HIGH (ref 78.0–100.0)
Platelets: 145 10*3/uL — ABNORMAL LOW (ref 150–400)
RBC: 3.68 MIL/uL — ABNORMAL LOW (ref 3.87–5.11)
RDW: 14.1 % (ref 11.5–15.5)

## 2013-08-11 LAB — GLUCOSE, CAPILLARY
Glucose-Capillary: 99 mg/dL (ref 70–99)
Glucose-Capillary: 125 mg/dL — ABNORMAL HIGH (ref 70–99)

## 2013-08-11 LAB — TYPE AND SCREEN
Antibody Screen: NEGATIVE
Unit division: 0
Unit division: 0
Unit division: 0
Unit division: 0

## 2013-08-11 LAB — BASIC METABOLIC PANEL
Calcium: 7.8 mg/dL — ABNORMAL LOW (ref 8.4–10.5)
Creatinine, Ser: 0.76 mg/dL (ref 0.50–1.10)
GFR calc Af Amer: >90 mL/min (ref >90)
GFR calc non Af Amer: 89 mL/min — ABNORMAL LOW (ref >90)
Glucose, Bld: 89 mg/dL (ref 70–99)
Sodium: 134 mEq/L — ABNORMAL LOW (ref 135–145)

## 2013-08-11 MED ORDER — ONDANSETRON HCL 4 MG PO TABS: 4.0000 mg | ORAL_TABLET | Freq: Four times a day (QID) | ORAL | Status: DC | PRN

## 2013-08-11 MED ORDER — TRAMADOL HCL 50 MG PO TABS
50.0000 mg | ORAL_TABLET | ORAL | Status: DC | PRN
  Administered 2013-08-11 – 2013-08-12 (×4): 50 mg via ORAL
  Filled 2013-08-11 (×4): qty 1

## 2013-08-11 MED ORDER — ASPIRIN EC 325 MG PO TBEC
325.0000 mg | DELAYED_RELEASE_TABLET | Freq: Every day | ORAL | Status: DC
  Administered 2013-08-12: 325 mg via ORAL
  Filled 2013-08-11 (×2): qty 1

## 2013-08-11 MED ORDER — FUROSEMIDE 40 MG PO TABS
40.0000 mg | ORAL_TABLET | Freq: Every day | ORAL | Status: DC
  Administered 2013-08-12: 40 mg via ORAL
  Filled 2013-08-11: qty 1

## 2013-08-11 MED ORDER — SODIUM CHLORIDE 0.9 % IJ SOLN
3.0000 mL | Freq: Two times a day (BID) | INTRAMUSCULAR | Status: DC
  Administered 2013-08-11 – 2013-08-13 (×3): 3 mL via INTRAVENOUS

## 2013-08-11 MED ORDER — OXYCODONE HCL 5 MG PO TABS
5.0000 mg | ORAL_TABLET | ORAL | Status: DC | PRN
  Administered 2013-08-11 – 2013-08-12 (×2): 5 mg via ORAL
  Administered 2013-08-13: 10 mg via ORAL
  Filled 2013-08-11: qty 1
  Filled 2013-08-11 (×2): qty 2

## 2013-08-11 MED ORDER — POTASSIUM CHLORIDE CRYS ER 20 MEQ PO TBCR
20.0000 meq | EXTENDED_RELEASE_TABLET | Freq: Every day | ORAL | Status: AC
  Administered 2013-08-12 – 2013-08-13 (×2): 20 meq via ORAL
  Filled 2013-08-11 (×2): qty 1

## 2013-08-11 MED ORDER — MOVING RIGHT ALONG BOOK
Freq: Once | Status: AC
  Administered 2013-08-11: 15:00:00
  Filled 2013-08-11: qty 1

## 2013-08-11 MED ORDER — ACETAMINOPHEN 325 MG PO TABS: 650.0000 mg | ORAL_TABLET | Freq: Four times a day (QID) | ORAL | Status: DC | PRN

## 2013-08-11 MED ORDER — HYDROCORTISONE 0.5 % EX CREA
TOPICAL_CREAM | Freq: Two times a day (BID) | CUTANEOUS | Status: DC | PRN
  Administered 2013-08-11 – 2013-08-12 (×2): via TOPICAL
  Filled 2013-08-11: qty 28.35

## 2013-08-11 MED ORDER — SODIUM CHLORIDE 0.9 % IJ SOLN: 3.0000 mL | INTRAMUSCULAR | Status: DC | PRN

## 2013-08-11 MED ORDER — ALUM & MAG HYDROXIDE-SIMETH 200-200-20 MG/5ML PO SUSP
30.0000 mL | Freq: Four times a day (QID) | ORAL | Status: DC | PRN
  Administered 2013-08-11: 30 mL via ORAL
  Filled 2013-08-11: qty 30

## 2013-08-11 MED ORDER — INSULIN ASPART 100 UNIT/ML ~~LOC~~ SOLN
0.0000 [IU] | Freq: Three times a day (TID) | SUBCUTANEOUS | Status: DC
  Administered 2013-08-11: 2 [IU] via SUBCUTANEOUS

## 2013-08-11 MED ORDER — FAMOTIDINE 20 MG PO TABS
20.0000 mg | ORAL_TABLET | Freq: Two times a day (BID) | ORAL | Status: DC
  Administered 2013-08-11 – 2013-08-13 (×4): 20 mg via ORAL
  Filled 2013-08-11 (×5): qty 1

## 2013-08-11 MED ORDER — SODIUM CHLORIDE 0.9 % IV SOLN: 250.0000 mL | INTRAVENOUS | Status: DC | PRN

## 2013-08-11 MED ORDER — ONDANSETRON HCL 4 MG/2ML IJ SOLN: 4.0000 mg | Freq: Four times a day (QID) | INTRAMUSCULAR | Status: DC | PRN

## 2013-08-11 MED ORDER — DIPHENHYDRAMINE HCL 25 MG PO CAPS
25.0000 mg | ORAL_CAPSULE | Freq: Four times a day (QID) | ORAL | Status: DC | PRN
  Administered 2013-08-11 – 2013-08-12 (×2): 25 mg via ORAL
  Filled 2013-08-11 (×2): qty 1

## 2013-08-11 NOTE — Plan of Care (Signed)
Problem: Phase III Progression Outcomes Goal: Time patient transferred to PCTU/Telemetry POD Outcome: Completed/Met Date Met:  08/11/13 08/11/13 ~@1435 

## 2013-08-11 NOTE — Progress Notes (Signed)
Pt transferred to 2W35. Pt ambulated distance to new room, tolerated ambulation on 4LNC, portable tele. Pt cell phone/charger sent with pt. Receiving RN, Asher Muir at bedside, placed on receiving units tele, 4LNC. Family notified of transfer, will continue to monitor. Cathy Moody

## 2013-08-11 NOTE — Progress Notes (Signed)
Pt given Maalox per request at this time; will cont. To monitor. 

## 2013-08-11 NOTE — Progress Notes (Signed)
Dramatic improvement since cath on 08/01/13.! She is in good spirits and looks healthier. Great job by [employer] team.

## 2013-08-11 NOTE — Progress Notes (Signed)
Anesthesiology Follow-up:  Awake and alert, in good spirits, neuro intact, hemodynamically stable, chest tubes removed today.  VS: T-36.7 HR 73 (SR) RR 15  BP 123/62  O2 Sat 95% on 4L  Na 134 K- 4.0 BUN/Cr. 16/0.76 H/H 12.0/38.2  Plts- 145,000   Extubated 5 hours post-op using rapid wean protocol.  Echo 12/17 shows no perivalvular leak, mean gradient 5 mm hg  Pt doing very well S/P TAVR on 12/17, no apparent complications.  Kipp Brood, MD

## 2013-08-11 NOTE — Progress Notes (Signed)
2 Days Post-Op Procedure(s) (LRB): TRANSCATHETER AORTIC VALVE REPLACEMENT, TRANSAPICAL (Left) INTRAOPERATIVE TRANSESOPHAGEAL ECHOCARDIOGRAM (N/A) Subjective: Sore but overall feels like she is breathing much better postop  Objective: Vital signs in last 24 hours: Temp:  [97.3 F (36.3 C)-98.2 F (36.8 C)] 98.1 F (36.7 C) (12/18 0727) Pulse Rate:  [58-76] 63 (12/18 0700) Cardiac Rhythm:  [-] Normal sinus rhythm (12/18 0600) Resp:  [14-24] 17 (12/18 0700) BP: (86-118)/(42-62) 102/48 mmHg (12/18 0700) SpO2:  [79 %-100 %] 93 % (12/18 0700) Arterial Line BP: (139)/(71) 139/71 mmHg (12/17 0900) Weight:  [83.7 kg (184 lb 8.4 oz)] 83.7 kg (184 lb 8.4 oz) (12/18 0500)  Hemodynamic parameters for last 24 hours: PAP: (80)/(28) 80/28 mmHg  Intake/Output from previous day: 12/17 0701 - 12/18 0700 In: 2037.5 [P.O.:1440; I.V.:497.5; IV Piggyback:100] Out: 2610 [Urine:2090; Chest Tube:520] Intake/Output this shift:    General appearance: alert and cooperative Neurologic: intact Heart: regular rate and rhythm, S1, S2 normal, no murmur, click, rub or gallop Lungs: clear to auscultation bilaterally Extremities: extremities normal, atraumatic, no cyanosis or edema Wound: incision ok  Lab Results:  Recent Labs  08/10/13 1700 08/10/13 1724 08/11/13 0430  WBC 14.2*  --  12.2*  HGB 12.6 13.9 12.0  HCT 39.4 41.0 38.2  PLT 162  --  145*   BMET:  Recent Labs  08/10/13 0405  08/10/13 1724 08/11/13 0430  NA 136  --  137 134*  K 4.5  --  3.8 4.0  CL 96  --  88* 93*  CO2 37*  --   --  38*  GLUCOSE 114*  --  135* 89  BUN 18  --  15 16  CREATININE 0.76  < > 1.00 0.76  CALCIUM 8.2*  --   --  7.8*  < > = values in this interval not displayed.  PT/INR:  Recent Labs  08/09/13 1500  LABPROT 17.5*  INR 1.48   ABG    Component Value Date/Time   PHART 7.351 08/09/2013 2105   HCO3 36.0* 08/09/2013 2105   TCO2 38 08/10/2013 1724   O2SAT 94.0 08/09/2013 2105   CBG (last 3)    Recent Labs  08/10/13 1601 08/10/13 2040 08/11/13 0418  GLUCAP 119* 160* 86   *[place]* *[facility]* [address] [place], [place] [address] [phone]  ------------------------------------------------------------ Transthoracic Echocardiography  Patient: Cathy Moody, Cathy Moody MR #: [id number] Study Date: 08/10/2013 Gender: F Age: 62 Height: 152.4cm Weight: 84.1kg BSA: 1.46m^2 Pt. Status: Room: 2S15C  PERFORMING [facility] [place], [place] REFERRING [place], Judie Grieve SONOGRAPHER Silvano Bilis, RCS ATTENDING Rolland Porter ADMITTING [place] Spaniel, [place] cc:  ------------------------------------------------------------ LV EF: 40% - 45%  ------------------------------------------------------------ Indications: CHF - 428.0. s/p TAVR  ------------------------------------------------------------ History: PMH: Alcohol abuse. Aortic valve disease. Chronic obstructive pulmonary disease. Risk factors: Current tobacco use.  ------------------------------------------------------------ Study Conclusions  - Left ventricle: The cavity size was normal. Wall thickness was increased in a pattern of mild LVH. There was focal basal hypertrophy. Systolic function was mildly to moderately reduced. The estimated ejection fraction was in the range of 40% to 45%. Wall motion was normal; there were no regional wall motion abnormalities. - Mitral valve: Calcified annulus. - Left atrium: The atrium was mildly dilated. - Right ventricle: The cavity size was mildly dilated. Systolic function was mildly reduced. - Right atrium: The atrium was mildly dilated. - Tricuspid valve: Moderate regurgitation. - Pulmonary arteries: Systolic pressure was moderately increased. PA peak pressure: 58mm Hg (S).  ------------------------------------------------------------ Labs, prior tests,  procedures, and surgery: Valve surgery (current admission, August 09, 2013). TAVR.  Transthoracic echocardiography. M-mode, complete 2D, spectral Doppler, and color Doppler. Height: Height: 152.4cm. Height: 60in. Weight: Weight: 84.1kg. Weight: 185lb. Body mass index: BMI: 36.2kg/m^2. Body surface area: BSA: 1.56m^2. Blood pressure: 116/64. Patient status: Inpatient. Location: ICU/CCU  ------------------------------------------------------------  ------------------------------------------------------------ Left ventricle: The cavity size was normal. Wall thickness was increased in a pattern of mild LVH. There was focal basal hypertrophy. Systolic function was mildly to moderately reduced. The estimated ejection fraction was in the range of 40% to 45%. Wall motion was normal; there were no regional wall motion abnormalities. Early diastolic septal annular tissue Doppler velocities Ea were abnormal.  ------------------------------------------------------------ Aortic valve: The patient is s/p TAVR. The aortic valve seems to be functioning well. Cusp separation was normal. Doppler: Transvalvular velocity was within the normal range. There was no stenosis. No regurgitation. VTI ratio of LVOT to aortic valve: 0.43. Peak velocity ratio of LVOT to aortic valve: 0.4. Mean velocity ratio of LVOT to aortic valve: 0.44. Mean gradient: 5mm Hg (S). Peak gradient: 11mm Hg (S).  ------------------------------------------------------------ Aorta: Aortic root: The aortic root was normal in size. Ascending aorta: The ascending aorta was normal in size.  ------------------------------------------------------------ Mitral valve: Calcified annulus. Leaflet separation was normal. Doppler: Transvalvular velocity was within the normal range. There was no evidence for stenosis. No regurgitation. Peak gradient: 5mm Hg (D).  ------------------------------------------------------------ Left atrium: The atrium was mildly  dilated.  ------------------------------------------------------------ Right ventricle: The cavity size was mildly dilated. Systolic function was mildly reduced.  ------------------------------------------------------------ Pulmonic valve: Structurally normal valve. Cusp separation was normal. Doppler: Transvalvular velocity was within the normal range. No regurgitation.  ------------------------------------------------------------ Tricuspid valve: Doppler: Moderate regurgitation.  ------------------------------------------------------------ Pulmonary artery: Systolic pressure was moderately increased.  ------------------------------------------------------------ Right atrium: The atrium was mildly dilated.  ------------------------------------------------------------ Pericardium: There was no pericardial effusion.  ------------------------------------------------------------  2D measurements Normal Doppler measurements Normal Left ventricle Main pulmonary LVID ED, 46.7 mm 43-52 artery chord, Pressure, S 58 mm =30 PLAX Hg LVID ES, 30.5 mm 23-38 Left ventricle chord, Ea, lat 6.91 cm/s ------ PLAX ann, tiss FS, chord, 35 % >29 DP PLAX E/Ea, lat 15.48 ------ LVPW, ED 20.8 mm ------ ann, tiss IVS/LVPW 0.76 <1.3 DP ratio, ED Ea, med 6.25 cm/s ------ Ventricular septum ann, tiss IVS, ED 15.9 mm ------ DP Aorta E/Ea, med 17.12 ------ Root diam 27 mm ------ ann, tiss Left atrium DP AP dim 44 mm ------ LVOT AP dim 2.28 cm/m^2 <2.2 Peak vel, S 66.9 cm/s ------ index Mean vel, S 45.4 cm/s ------ VTI, S 11 cm ------ Peak 2 mm ------ gradient, S Hg Mean 1 mm ------ gradient, S Hg Aortic valve Peak vel, S 167 cm/s ------ Mean vel, S 103 cm/s ------ VTI, S 25.8 cm ------ Mean 5 mm ------ gradient, S Hg Peak 11 mm ------ gradient, S Hg VTI ratio 0.43 ------ LVOT/AV Peak vel 0.4 ------ ratio, LVOT/AV Vmean ratio 0.44 ------ LVOT/AV Mitral valve Peak E vel 107 cm/s  ------ Peak A vel 48.8 cm/s ------ Peak 5 mm ------ gradient, D Hg Peak E/A 2.19 ------ ratio Tricuspid valve Regurg peak 346 cm/s ------ vel Peak RV-RA 48 mm ------ gradient, S Hg Systemic veins Estimated 5 mm ------ CVP Hg Right ventricle Pressure, S 53 mm <30 Hg Sa vel, lat 13.4 cm/s ------ ann, tiss DP  ------------------------------------------------------------ Prepared and Electronically Authenticated by  Kristeen Miss 2014-12-17T18:06:[id number]   Assessment/Plan: S/P Procedure(s) (LRB): TRANSCATHETER AORTIC VALVE REPLACEMENT, TRANSAPICAL (Left) INTRAOPERATIVE  TRANSESOPHAGEAL ECHOCARDIOGRAM (N/A) She is doing well following TAVR. She still has a lot of atelectasis in the lower lobes on CXR but is breathing and coughing well and this will improve. CT output is low so will remove them and that should help.  Postop echo looks good with no perivalvular or central regurgitation.  Her SBP is in the 90's so will hold off on lopressor especially with severe COPD.   Gentle diuresis as needed. Her weight is only a couple pounds over preop.  She can probably go to 2W later today.   LOS: 13 days    Kenyon Eshleman K 08/11/2013

## 2013-08-11 NOTE — Progress Notes (Signed)
Jacques Earthly PA updated order per Dr Laneta Simmers to pull chest tube x2. Updated on output with dangle this AM and output overnight. PA called OR to confirm with MD, MD ok to still pull chest tube x2 as ordered. Will continue to monitor. Koren Bound

## 2013-08-12 LAB — URINE CULTURE: Colony Count: >=100000

## 2013-08-12 MED ORDER — FUROSEMIDE 40 MG PO TABS
40.0000 mg | ORAL_TABLET | Freq: Two times a day (BID) | ORAL | Status: DC
  Administered 2013-08-12 – 2013-08-13 (×2): 40 mg via ORAL
  Filled 2013-08-12 (×4): qty 1

## 2013-08-12 NOTE — Progress Notes (Signed)
Sitting up. Says she's eager to go home. Great improvement.  Agree with Dr. Katrinka Blazing. Thank you TAVR team.

## 2013-08-12 NOTE — Progress Notes (Addendum)
    Subjective:  Feeling much better. Still some pain at apical incision site. No substernal chest discomfort or palpitations. Breathing is improved. She complains of a pruritic rash on the back and arms  Objective:  Vital Signs in the last 24 hours: Temp:  [97.8 F (36.6 C)-98.6 F (37 C)] 98.1 F (36.7 C) (12/19 0335) Pulse Rate:  [70-78] 77 (12/19 0335) Resp:  [18-25] 20 (12/19 0335) BP: (93-125)/(60-78) 114/65 mmHg (12/19 0335) SpO2:  [92 %-98 %] 96 % (12/19 0335)  Intake/Output from previous day: 12/18 0701 - 12/19 0700 In: 920 [P.O.:840; I.V.:80] Out: 500 [Urine:360; Chest Tube:140]  Physical Exam: Pt is alert and oriented, NAD HEENT: normal Neck: JVP - normal, carotids 2+= without bruits Lungs: Diminished both bases with decreased breath sounds CV: RRR without murmur or gallop Abd: soft, NT, Positive BS, no hepatomegaly Ext: 1+ pretibial edema bilaterally, distal pulses intact and equal Skin: warm/dry, rash noted  Lab Results:  Recent Labs  08/10/13 1700 08/10/13 1724 08/11/13 0430  WBC 14.2*  --  12.2*  HGB 12.6 13.9 12.0  PLT 162  --  145*    Recent Labs  08/10/13 0405  08/10/13 1724 08/11/13 0430  NA 136  --  137 134*  K 4.5  --  3.8 4.0  CL 96  --  88* 93*  CO2 37*  --   --  38*  GLUCOSE 114*  --  135* 89  BUN 18  --  15 16  CREATININE 0.76  < > 1.00 0.76  < > = values in this interval not displayed. No results found for this basename: TROPONINI, CK, MB,  in the last 72 hours  Tele: Sinus rhythm, personally reviewed  Assessment/Plan:  1. Severe aortic stenosis status post TAVR 2. Acute mixed systolic and diastolic heart failure, improved 3. Severe COPD with chronic respiratory failure  Overall she is greatly improved. I think it is best to avoid beta blockers and ACE inhibitors as her blood pressure is periodically low with systolic readings in the 80s and 90s at times. Her LV function has improved by postop echocardiogram. Would increase  her furosemide to 40 mg twice daily as I think she still has significant volume overload. Her rash may be related to contact dermatitis or a drug reaction. If not improved in the next 24 hours, would consider discontinuing Plavix and starting effient at a half dose of 5 mg daily. She's eager to go home but not sure she's quite ready. Will discuss disposition with Dr Laneta Simmers.  Tonny Bollman, M.D. 08/12/2013, 11:29 AM

## 2013-08-12 NOTE — Discharge Instructions (Signed)
Aortic Valve Replacement °Care After °Refer to this sheet in the next few weeks. These instructions provide you with information on caring for yourself after your procedure. Your caregiver may also give you specific instructions. Your treatment has been planned according to current medical practices, but problems sometimes occur. Call your caregiver if you have any problems or questions after your procedure. °HOME CARE INSTRUCTIONS  °· Only take over-the-counter or prescription medicines as directed by your caregiver. °· Take your temperature every morning for the first 7 days after surgery. Write these down. Call your caregiver if your temperature stays above 100° F (37.8° C) for more than a day.   °· Weigh yourself every morning for at least 7 days after surgery. Write your weight down to monitor any weight increase. °· Wear elastic stockings during the day for at least 2 weeks after surgery. Use them longer if your ankles are swollen. The stockings help blood flow and help reduce swelling in the legs.  °· Take frequent naps or rest often throughout the day. °· Avoid lifting over 10 lbs (4.5 kg) or pushing or pulling things with your arms for 6 8 weeks or as directed by your caregiver. °· Avoid driving or airplane travel for 4 6 weeks after surgery or as directed. If you are riding in a car for an extended period, stop every 1 2 hours to stretch your legs. Keep a record of your medicines and medical history with you when traveling. °· Do not cross your legs. °· Do not take baths for 4 6 weeks after surgery. Take showers once your caregiver approves. Pat incisions dry. Do not rub incisions with a washcloth or towel. °· Avoid climbing stairs and using the handrail to pull yourself up for the first 2 3 weeks after surgery. °· Return to work as directed by your caregiver. °· Drink enough fluids to keep your urine clear or pale yellow. °· Do not strain to have a bowel movement. Eat high-fiber foods if you become  constipated. You may also take a medicine to help you have a bowel movement (laxative) as directed by your caregiver. °· Resume sexual activity as directed by your caregiver. Men should not use medicines for erectile dysfunction until their doctor says it is okay. °· If you had a certain type of heart condition in the past, you may need to take antibiotic medicine before having dental work or surgery. Let your dentist and caregivers know if you had one or more of the following: °· Previous endocarditis. °· An artificial (prosthetic) heart valve. °· Congenital heart disease. °SEEK MEDICAL CARE IF: °· You develop a skin rash.   °· Your weight is increasing each day over 2 3 days, or you have a sudden weight gain. °· Your weight increases by 2 or more pounds (1 kg or more) in a single day. °SEEK IMMEDIATE MEDICAL CARE IF:  °· You develop chest pain that is not coming from your incision.   °· You develop shortness of breath or have difficulty breathing.   °· You have a fever.   °· You have increased bleeding from your wounds.   °· You have increasing wound pain.   °· You have redness or swelling around your wounds °· You have pus coming from your wound.   °· You develop lightheadedness.   °MAKE SURE YOU:  °· Understand these directions. °· Will watch your condition. °· Will get help right away if you are not doing well or get worse. °Document Released: 02/27/2005 Document Revised: 07/28/2012 Document Reviewed: 05/25/2012 °[employer]® Patient   Information ©2014 [employer]. ° °

## 2013-08-12 NOTE — Progress Notes (Signed)
CARDIAC REHAB PHASE I   PRE:  Rate/Rhythm: 82 SR  BP:  Supine:   Sitting: 100/60  Standing:    SaO2: 94% 2L/min  MODE:  Ambulation: 400 ft   POST:  Rate/Rhythm: 98 SR  BP:  Supine:   Sitting: 104/60  Standing:    SaO2: 76% 2L/min O2 Ambulated with assistance x1, rolling walker, and 2 L/min of oxygen.  Patient desaturated to 76% upon return to room even though she was purse-lip breathing most of the time.  Oxygen was increased to 4L and saturation increased to 94 % quickly.  She requires more oxygen with exercise for now.  IS encouraged hourly. [id number] Cindra Eves RN, BSN 08/12/2013 9:29 AM

## 2013-08-12 NOTE — Progress Notes (Signed)
08/12/13 Patient ambulated in hallway with family approximately 250 feet with walker. Back in room call bell within reach. Cathy Moody, Cathy Moody will continue to monitor patient Cathy Moody, Cathy Moody

## 2013-08-12 NOTE — Progress Notes (Signed)
Pt curious as to why her orders for Symbicort and Spiriva have ben d/c. Pt would like to continue these medications now and after discharge.

## 2013-08-12 NOTE — Discharge Summary (Signed)
[address]       [place] [address]             [phone]       Cathy Moody 04-22-1951 62 y.o. [id number]  07/29/2013   Alleen Borne, MD  Alcohol abuse [305.00] CHF (congestive heart failure) [428.0] Acute CHF [428.0] Asthma [493.90]   HPI:  This is a 62 y.o. female who was admitted with shortness of breath. She presented to the emergency department with symptoms that have been progressive over the last 4 weeks. She has a history of a heart murmur, having seen a cardiologist over 30 years ago, but has not had long-term followup. Additionally she has COPD and long-standing tobacco abuse having smoked greater than 50 years. The symptoms were primarily noted to be exertional but have worsened over the 4 week period. She also noted associated orthopnea, PND, and worsening lower extremity edema. He she also had some anginal type symptoms including bilateral neck tightness, jaw and back pain. All symptoms were worse with exertion and relieved with rest. There is no associated nausea vomiting, diaphoresis, syncope or near syncope. She was felt to require admission for further evaluation and treatment. Past Medical History   Diagnosis  Date   .  Asthma/COPD    .  Heart murmur    .  Acute CHF      Cathy Moody 07/29/2013   .  Alcohol abuse      /notes 07/29/2013   .  Hypotension    History reviewed. No pertinent past surgical history except C-section.  Family History   Problem  Relation  Age of Onset   .  Diabetes  Paternal Grandmother     History    Social History   .  Marital Status:  Divorced     Spouse Name:  N/A     Number of Children:  N/A   .  Years of Education:  N/A    Occupational History   .  Stays at home and cares for grandchildren    Social History Main Topics   .  Smoking status:  Smoked 1-2 ppd for many years. Recently smoking 6-10 cig per day and using vapor.     Types:  Cigarettes   .  Smokeless tobacco:  Not on file   .  Alcohol Use:   Yes      Comment: Pt is weekend drinker   .  Drug Use:  No   .  Sexual Activity:  Not on file    Other Topics  Concern   .  Not on file    Social History Narrative   .  No narrative on file      Hospital Course:  The patient was admitted and found to have an elevated BNP of greater than 26,000. Additionally initial troponin I POC was 0.7 and subsequent followup levels were less than 0.3. She underwent thorough evaluation including cardiology consultation. A 2-D echocardiogram showed severe calcific aortic stenosis with an aortic valve area of 0.61 centimeters squared by VTI and 0.49 cm by V. max. The mean gradient was 47 mm of mercury and peak gradient was 80 mm of mercury. Her ejection fraction was severely reduced at 25% with diffuse hypokinesis and mild to moderate mitral regurgitation. Systolic PA pressure was severely elevated at 64. The LV O. T. diameter was only 19 mm. Additionally she underwent right and left heart catheterization showing no coronary disease, severe pulmonary hypertension  with PA of 65/ 31 with a PCWP mean of 31, RA of 14, and cardiac output of 3.07. Cardiac surgical consultation was obtained with Rexanne Mano M.D. who evaluated the patient and studies and due to several factors she was not felt to be a candidate for sternotomy approach for aortic valve replacement. She underwent thorough evaluation to be considered as a TAVR candidate and all appropriate studies/consultations were obtained. Ultimately she was deemed to be acceptable for this and after medical stabilization it was determined that she could proceed.. on 08/09/2013 she was taken to the operating room at which time she underwent the following procedure:  CARDIOTHORACIC SURGERY OPERATIVE NOTE  Date of Procedure: 08/09/2013  Preoperative Diagnosis: Severe Aortic Stenosis  Postoperative Diagnosis: Same  Procedure:  Transcatheter Aortic Valve Replacement - Transapical Approach Edwards Sapien THV (size 29 mm,  model # [id number], serial # [id number])  Co-Surgeons: Alleen Borne, MD and Tonny Bollman, MD  Assistants: Doree Fudge, PA-C  Anesthesiologist: Kipp Brood, MD  Echocardiographer: Marca Ancona, MD  Pre-operative Echo Findings:  severe aortic stenosis  severe left ventricular systolic function  Moderate to severe AI Post-operative Echo Findings:  no paravalvular leak  stable left ventricular systolic function  Trivial MR  The patient tolerated the procedure well and is transported to the surgical intensive care in stable condition. There are no intraoperative complications. All sponge instrument and needle counts are verified correct at completion of the operation.  No blood products were administered during the operation.  Postoperative hospital course: The patient has overall progressed nicely. She was extubated using the rapid weaning protocol without difficulty. She has remained neurologically intact. She does have a moderate volume overload postoperatively requiring diuresis. Hemodynamics have been improved but she is not felt to be currently a candidate for beta blocker or ACE inhibitor as her blood pressure is periodically low with systolic readings in the eighties and nineties that time. Her LV function is noted to be improved postoperatively. Postoperative echocardiogram shows no paravalvular or central regurgitation. She has had no significant postoperative cardiac dysrhythmias. She did develop an urticarial type rash of uncertain etiology which is being treated conservatively with Benadryl and hydrocortisone cream. If he does not improve consideration of discontinuing Plavix and changing Effient may  be required. We were unable to wean down patients oxygen.  Therefore, home oxygen has been arranged at 3L via nasal cannula. Laboratory values have been stable. A She is tolerating gradually increasing activities using standard protocols. Incisions are healing well. Currently her  status is felt to be tentatively stable for discharge in the next 24-48 hours pending ongoing reevaluation of her recovery.    Recent Labs  08/10/13 0405 08/10/13 1724 08/11/13 0430  NA 136 137 134*  K 4.5 3.8 4.0  CL 96 88* 93*  CO2 37*  --  38*  GLUCOSE 114* 135* 89  BUN 18 15 16   CALCIUM 8.2*  --  7.8*    Recent Labs  08/10/13 1700 08/10/13 1724 08/11/13 0430  WBC 14.2*  --  12.2*  HGB 12.6 13.9 12.0  HCT 39.4 41.0 38.2  PLT 162  --  145*    Recent Labs  08/09/13 1500  INR 1.48     Discharge Instructions:  The patient is discharged to home with extensive instructions on wound care and progressive ambulation.  They are instructed not to drive or perform any heavy lifting until returning to see the physician in his office.  Discharge Diagnosis:  Alcohol abuse [305.00]  CHF (congestive heart failure) [428.0] Acute CHF [428.0] Asthma [493.90]  Secondary Diagnosis: Patient Active Problem List   Diagnosis Date Noted  . S/P TAVR (transcatheter aortic valve replacement) 08/09/2013  . Anxiety state, unspecified 08/03/2013  . Pulmonary hypertension, moderate to severe 08/03/2013  . Elevated TSH 08/03/2013  . Hypotension   . Acute systolic heart failure 07/30/2013  . Aortic stenosis, severe 07/30/2013  . COPD, severe 07/29/2013  . Alcohol abuse 07/29/2013  . Acute CHF 07/29/2013  . CHF (congestive heart failure) 07/29/2013  . Tobacco abuse 07/29/2013  . Asthma 02/14/2011  . Allergic rhinitis 02/14/2011  . Heart murmur 02/14/2011  . Smoker 02/14/2011   Past Medical History  Diagnosis Date  . Asthma   . Heart murmur   . Acute CHF     Cathy Moody 07/29/2013  . Alcohol abuse     /notes 07/29/2013  . Hypotension     Medications:    Medication List         albuterol 108 (90 BASE) MCG/ACT inhaler  Commonly known as:  PROVENTIL HFA;VENTOLIN HFA  Inhale 2 puffs into the lungs every 6 (six) hours as needed for wheezing or shortness of breath.     aspirin 81  MG EC tablet  Take 1 tablet (81 mg total) by mouth daily.     clopidogrel 75 MG tablet  Commonly known as:  PLAVIX  Take 1 tablet (75 mg total) by mouth daily with breakfast.     diphenhydrAMINE 25 mg capsule  Commonly known as:  BENADRYL  Take 1 capsule (25 mg total) by mouth every 6 (six) hours as needed for itching (for rash).     furosemide 40 MG tablet  Commonly known as:  LASIX  Take 1 tablet (40 mg total) by mouth 2 (two) times daily.     hydrocortisone cream 0.5 %  Apply topically 2 (two) times daily as needed for itching (to rash).     potassium chloride SA 20 MEQ tablet  Commonly known as:  K-DUR,KLOR-CON  Take 1 tablet (20 mEq total) by mouth daily.     traMADol 50 MG tablet  Commonly known as:  ULTRAM  Take 1-2 tablets (50-100 mg total) by mouth every 4 (four) hours as needed for moderate pain.       Follow up:   Disposition: Discharged home  Patient's condition is Good  Gershon Crane, PA-C 08/12/2013  12:56 PM

## 2013-08-12 NOTE — Progress Notes (Addendum)
[address]       [place] [address]             [phone]      3 Days Post-Op  Procedure(s) (LRB): TRANSCATHETER AORTIC VALVE REPLACEMENT, TRANSAPICAL (Left) INTRAOPERATIVE TRANSESOPHAGEAL ECHOCARDIOGRAM (N/A) Subjective: Feels "great", had some blood in stool, she believes it's hemorrhoids which she has had before. Has rash on back  Objective  Telemetry sinus rhythm  Temp:  [97.8 F (36.6 C)-98.6 F (37 C)] 98.1 F (36.7 C) (12/19 0335) Pulse Rate:  [63-78] 77 (12/19 0335) Resp:  [15-25] 20 (12/19 0335) BP: (93-140)/(48-78) 114/65 mmHg (12/19 0335) SpO2:  [91 %-99 %] 96 % (12/19 0335)   Intake/Output Summary (Last 24 hours) at 08/12/13 0733 Last data filed at 08/11/13 1740  Gross per 24 hour  Intake    920 ml  Output    500 ml  Net    420 ml       General appearance: alert, cooperative and no distress Heart: regular rate and rhythm and no murmur Lungs: dim throughout Abdomen: benign Extremities: min edema Wound: dressing CDI Skin - has urticarial rash on back Lab Results:  Recent Labs  08/10/13 0405 08/10/13 1700 08/10/13 1724 08/11/13 0430  NA 136  --  137 134*  K 4.5  --  3.8 4.0  CL 96  --  88* 93*  CO2 37*  --   --  38*  GLUCOSE 114*  --  135* 89  BUN 18  --  15 16  CREATININE 0.76 0.79 1.00 0.76  CALCIUM 8.2*  --   --  7.8*  MG 2.4 2.0  --   --    No results found for this basename: AST, ALT, ALKPHOS, BILITOT, PROT, ALBUMIN,  in the last 72 hours No results found for this basename: LIPASE, AMYLASE,  in the last 72 hours  Recent Labs  08/10/13 1700 08/10/13 1724 08/11/13 0430  WBC 14.2*  --  12.2*  HGB 12.6 13.9 12.0  HCT 39.4 41.0 38.2  MCV 103.1*  --  103.8*  PLT 162  --  145*   No results found for this basename: CKTOTAL, CKMB, TROPONINI,  in the last 72 hours No components found with this basename: POCBNP,  No results found for this basename: DDIMER,  in the last 72 hours No results found for this  basename: HGBA1C,  in the last 72 hours No results found for this basename: CHOL, HDL, LDLCALC, TRIG, CHOLHDL,  in the last 72 hours No results found for this basename: TSH, T4TOTAL, FREET3, T3FREE, THYROIDAB,  in the last 72 hours No results found for this basename: VITAMINB12, FOLATE, FERRITIN, TIBC, IRON, RETICCTPCT,  in the last 72 hours  Medications: Scheduled . aspirin EC  325 mg Oral Daily  . clopidogrel  75 mg Oral Q breakfast  . famotidine  20 mg Oral BID  . furosemide  40 mg Oral Daily  . guaiFENesin  600 mg Oral BID  . levalbuterol  0.63 mg Nebulization Q6H  . potassium chloride  20 mEq Oral Daily  . sodium chloride  3 mL Intravenous Q12H     Radiology/Studies:  Dg Chest Port 1 View  08/11/2013   CLINICAL DATA:  Post transcatheter aortic valve replacement  EXAM: PORTABLE CHEST - 1 VIEW  COMPARISON:  07/21/2013; 07/20/2013  FINDINGS: Grossly unchanged enlarged cardiac silhouette and mediastinal contours given persistently reduced lung volumes. Interval removal of PA catheter with remaining vascular  sheath tip overlying the confluence of the right jugular vein and innominate vein. Otherwise, stable positioning of support apparatus. No definite pneumothorax. Grossly unchanged small bilateral effusions with associated bibasilar opacities. Pulmonary vasculature remains indistinct. Grossly unchanged bones.  IMPRESSION: 1. Support apparatus as above.  No pneumothorax. 2. Persistent findings of hypoventilation, pulmonary edema, small bilateral effusions and associated bibasilar opacities, likely atelectasis.   Electronically Signed   By: Simonne Come M.D.   On: 08/11/2013 07:41    INR: Will add last result for INR, ABG once components are confirmed Will add last 4 CBG results once components are confirmed  Assessment/Plan: S/P Procedure(s) (LRB): TRANSCATHETER AORTIC VALVE REPLACEMENT, TRANSAPICAL (Left) INTRAOPERATIVE TRANSESOPHAGEAL ECHOCARDIOGRAM (N/A)  1 probable hemorrhoids,  she will cont to observe. Will examine if conts. But she has had before and feels as though this is consistent with previous episodes 2 rash- appears like poss contact dermatitis or drug related- cont benedryl and steroid cream prn and follow closely 3 no murmur noted, cont plavix and asa 4 routine pulm toilet/rehab- wean O2 5 gentle diuresis, recheck creat in am 6 recheck cxr in am 7 poss d/c in 1-2 days   LOS: 14 days    GOLD,WAYNE E 12/19/20147:33 AM   Chart reviewed, patient examined, agree with above. She is making progress. The rash looks like a drug rash and may be due to Plavix or pain meds. If it persists will switch Plavix as rec by Dr. Excell Seltzer.

## 2013-08-13 ENCOUNTER — Inpatient Hospital Stay (HOSPITAL_COMMUNITY): Payer: BC Managed Care – PPO

## 2013-08-13 LAB — BASIC METABOLIC PANEL
BUN: 11 mg/dL (ref 6–23)
Calcium: 8.5 mg/dL (ref 8.4–10.5)
Creatinine, Ser: 0.8 mg/dL (ref 0.50–1.10)
GFR calc non Af Amer: 78 mL/min — ABNORMAL LOW (ref >90)
Glucose, Bld: 122 mg/dL — ABNORMAL HIGH (ref 70–99)

## 2013-08-13 MED ORDER — ASPIRIN EC 81 MG PO TBEC
81.0000 mg | DELAYED_RELEASE_TABLET | Freq: Every day | ORAL | Status: DC
  Administered 2013-08-13: 81 mg via ORAL
  Filled 2013-08-13: qty 1

## 2013-08-13 MED ORDER — POTASSIUM CHLORIDE CRYS ER 20 MEQ PO TBCR: 20.0000 meq | EXTENDED_RELEASE_TABLET | Freq: Every day | ORAL | Status: DC

## 2013-08-13 MED ORDER — FUROSEMIDE 40 MG PO TABS: 40.0000 mg | ORAL_TABLET | Freq: Two times a day (BID) | ORAL | Status: DC

## 2013-08-13 MED ORDER — TRAMADOL HCL 50 MG PO TABS: 50.0000 mg | ORAL_TABLET | ORAL | Status: DC | PRN

## 2013-08-13 MED ORDER — ASPIRIN 81 MG PO TBEC: 81.0000 mg | DELAYED_RELEASE_TABLET | Freq: Every day | ORAL | Status: AC

## 2013-08-13 MED ORDER — HYDROCORTISONE 0.5 % EX CREA: TOPICAL_CREAM | Freq: Two times a day (BID) | CUTANEOUS | Status: DC | PRN

## 2013-08-13 MED ORDER — ALBUTEROL SULFATE HFA 108 (90 BASE) MCG/ACT IN AERS: 2.0000 | INHALATION_SPRAY | Freq: Four times a day (QID) | RESPIRATORY_TRACT | Status: DC | PRN

## 2013-08-13 MED ORDER — CLOPIDOGREL BISULFATE 75 MG PO TABS: 75.0000 mg | ORAL_TABLET | Freq: Every day | ORAL | Status: DC

## 2013-08-13 MED ORDER — DIPHENHYDRAMINE HCL 25 MG PO CAPS: 25.0000 mg | ORAL_CAPSULE | Freq: Four times a day (QID) | ORAL | Status: DC | PRN

## 2013-08-13 NOTE — Progress Notes (Signed)
   CARE MANAGEMENT NOTE 08/13/2013  Patient:  Cathy Moody, Cathy Moody   Account Number:  [id number]  Date Initiated:  08/01/2013  Documentation initiated by:  Junius Creamer  Subjective/Objective Assessment:   adm w chf     Action/Plan:   lives alone, pcp dr Onalee Hua tysinger  supportive mother and family.   Anticipated DC Date:  08/15/2013   Anticipated DC Plan:  HOME W HOME HEALTH SERVICES      DC Planning Services  CM consult      Choice offered to / List presented to:     DME arranged  [facility]      DME agency  [facility]        Status of service:  Completed, signed off Medicare Important Message given?   (If response is "NO", the following Medicare IM given date fields will be blank) Date Medicare IM given:   Date Additional Medicare IM given:    Discharge Disposition:  HOME/SELF CARE  Per UR Regulation:  Reviewed for med. necessity/level of care/duration of stay  If discussed at Long Length of Stay Meetings, dates discussed:   08/04/2013  08/09/2013    Comments:  08/13/13 09:40 CM spoke with pt in room who states she does not need a walker bc her family has one.  O2 will be delivered to room prior to discharge.  No other CM needs were communicated.  Freddy Jaksch, BSN, CM [phone].  Contact:  Archer,Vendela Mother [phone]   [phone]   08-10-13 10:20am Avie Arenas, RNBSN - [phone] Post op AVR 08-09-13.  Plan for dc home with mother and boyfriend for 24/7 coverage at home on discharge.  12/9  1202pm debbie dowell rn,bsn spoke w pt and her mother. pt lives alone but her mother assists if needed.not sure of dc needs at present. will follow and assist.  12/8 1021a debbie dowell rn,bsn may need hhc for chf follow up. will assist as needed.

## 2013-08-13 NOTE — Progress Notes (Addendum)
      [address]       [place] [address]             [phone]       4 Days Post-Op Procedure(s) (LRB): TRANSCATHETER AORTIC VALVE REPLACEMENT, TRANSAPICAL (Left) INTRAOPERATIVE TRANSESOPHAGEAL ECHOCARDIOGRAM (N/A)  Subjective:  Ms. Cid is ready to go home.  She states she can not be here another day.  She continues to have a rash which she states is most likely due to the sheets.  She states she requires hypoallergenic sheets at home.  She does ask if she can continues to inhalers she was initially taking after surgery.   Objective: Vital signs in last 24 hours: Temp:  [98 F (36.7 C)-98.9 F (37.2 C)] 98.9 F (37.2 C) (12/20 0453) Pulse Rate:  [78-88] 88 (12/20 0453) Cardiac Rhythm:  [-] Normal sinus rhythm (12/20 0750) Resp:  [18-20] 19 (12/20 0453) BP: (111-122)/(67-78) 111/73 mmHg (12/20 0453) SpO2:  [92 %-96 %] 93 % (12/20 0453) Weight:  [185 lb 1.6 oz (83.961 kg)] 185 lb 1.6 oz (83.961 kg) (12/20 0453)  Intake/Output from previous day: 12/19 0701 - 12/20 0700 In: 723 [P.O.:720; I.V.:3] Out: -  Intake/Output this shift: Total I/O In: 120 [P.O.:120] Out: -   General appearance: alert, cooperative and no distress Heart: regular rate and rhythm Lungs: clear to auscultation bilaterally Abdomen: soft, non-tender; bowel sounds normal; no masses,  no organomegaly Extremities: extremities normal, atraumatic, no cyanosis or edema Skin: rash along back, buttocks, creases of bilateral lower extremities Wound: clean and dry  Lab Results:  Recent Labs  08/10/13 1700 08/10/13 1724 08/11/13 0430  WBC 14.2*  --  12.2*  HGB 12.6 13.9 12.0  HCT 39.4 41.0 38.2  PLT 162  --  145*   BMET:  Recent Labs  08/11/13 0430 08/13/13 0630  NA 134* 134*  K 4.0 4.1  CL 93* 92*  CO2 38* 36*  GLUCOSE 89 122*  BUN 16 11  CREATININE 0.76 0.80  CALCIUM 7.8* 8.5    PT/INR: No results found for this basename: LABPROT, INR,  in the last 72 hours ABG   Component Value Date/Time   PHART 7.351 08/09/2013 2105   HCO3 36.0* 08/09/2013 2105   TCO2 38 08/10/2013 1724   O2SAT 94.0 08/09/2013 2105   CBG (last 3)   Recent Labs  08/11/13 0732 08/11/13 1113 08/11/13 2327  GLUCAP 99 120* 101*    Assessment/Plan: S/P Procedure(s) (LRB): TRANSCATHETER AORTIC VALVE REPLACEMENT, TRANSAPICAL (Left) INTRAOPERATIVE TRANSESOPHAGEAL ECHOCARDIOGRAM (N/A)  1. CV- NSR good rate and pressure control 2. Pulm- on oxygen this morning, severe COPD will require home oxygen saturations at rest were 80% 3. Renal- creatinine WNL, weight is stable continue Lasix  4. Rash- stable, this is most likely a contact dermatitis, it seems to only be located on pressure points, there is none present on patients chest or abdomen, of front of legs 5. Dispo- patient making progress, wants to be discharged, will discuss with Dr. Laneta Simmers   LOS: 15 days    Lowella Dandy 08/13/2013   Chart reviewed, patient examined, agree with above. She looks and feels well and wants to go home. The rash distribution looks like a contact dermatitis from the sheets. Will keep her on Plavix for now and see if this resolves at home.

## 2013-08-13 NOTE — Progress Notes (Signed)
08/13/2013 11:44 AM Nursing note Verbal orders received from Dr. Laneta Simmers not to remove pt. CTS prior to d/c home that they would be removed at f/u apt. Orders enacted and pt.updated on plan of care. Discharge avs form, medications already taken today and those due this evening given and explained to patient and family. Follow up appointments, incision site care, activity restrictions and when to call MD reviewed. Pt. States she received moving right along book but declined to view video #113 prior to discharge. Oxygen tank delivered to patient room prior to discharge and checked for patency prior to pt. D/c home. D/c iv line. D/c tele. D/c home with family per orders.  Kye Silverstein, Blanchard Kelch

## 2013-08-13 NOTE — Progress Notes (Signed)
08/13/2013 1000 Nursing note RN noted pt. Scheduled to receive 325 mg ASA in addition to Plavix. Clarified with Lowella Dandy PAC. Verbal orders received to d/c 325 mg ASA and place pt. On 81 mg ASA and for patient to continue at discharge. PA also made aware of cardiac rehab's report of oxygen saturation during ambulation on 4L dropping to 84% at end of walk. No new orders received other than to keep patient on 3L [place] continuous and 4L during ambulation. Pt. Updated on plan. Will continue to monitor patient.  Skylen Danielsen, Blanchard Kelch

## 2013-08-13 NOTE — Progress Notes (Signed)
CARDIAC REHAB PHASE I   PRE:  Rate/Rhythm: 86 sinus  BP:  Sitting: 116/62   SaO2: 97% 3L  MODE:  Ambulation: 550 ft 89-90% 4L  POST:  Rate/Rhythem: 97  BP:     SaO2: 83% 4L quick recovery to 93%  Pt ambulated 550 ft with assist x1 using rolling walker on 4L oxygen.  After 150 ft SaO2 was 89-90% on 4L.  Pt finished walk and returned to bedside.  Upon return, pt c/o SOB and SaO2 was 83% but quickly recovered to 93% on 4L.  Pt returned to wall unit at 3L for rest.  RN made aware.  Reviewed precautions, restrictions, diet, exercise, and cardiac rehab Phase II.  Pt was agreeable to [facility] at [facility].  Pt voiced understanding. Fabio Pierce, MA, ACSM RCEP [phone]  Hazle Nordmann

## 2013-08-15 ENCOUNTER — Other Ambulatory Visit: Payer: Self-pay

## 2013-08-15 MED ORDER — HYDROXYZINE HCL 50 MG PO TABS: ORAL_TABLET | ORAL | Status: DC

## 2013-08-15 MED ORDER — PRASUGREL HCL 5 MG PO TABS: 5.0000 mg | ORAL_TABLET | Freq: Every day | ORAL | Status: DC

## 2013-08-15 NOTE — Progress Notes (Signed)
Cathy Alstrom, RN Lauren Gilford Raid, RN            This is our TAVR patient, surgery was on 12/16. She has had a pretty bad rash since plavix so Coop wants the plavix D/C'd.   He wants her to start Effient 5mg  once/day.   He also wants her to have Atarax 25-50mg  (1-2 tabs) q6h as needed for itching.   She uses [facility] [address].   Patient is aware of all of this.   If you have any problems, let me know!   Thank you so much, [name]

## 2013-08-29 ENCOUNTER — Other Ambulatory Visit: Payer: Self-pay | Admitting: *Deleted

## 2013-08-29 DIAGNOSIS — I350 Nonrheumatic aortic (valve) stenosis: Secondary | ICD-10-CM

## 2013-08-31 ENCOUNTER — Encounter: Payer: Self-pay | Admitting: Surgery

## 2013-08-31 ENCOUNTER — Other Ambulatory Visit: Payer: Self-pay | Admitting: *Deleted

## 2013-08-31 ENCOUNTER — Ambulatory Visit (INDEPENDENT_AMBULATORY_CARE_PROVIDER_SITE_OTHER): Payer: BC Managed Care – PPO | Admitting: Surgery

## 2013-08-31 ENCOUNTER — Other Ambulatory Visit: Payer: Self-pay

## 2013-08-31 ENCOUNTER — Ambulatory Visit
Admission: RE | Admit: 2013-08-31 | Discharge: 2013-08-31 | Disposition: A | Discharge status: Home / Self Care | Payer: BC Managed Care – PPO | Source: Ambulatory Visit | Attending: Thoracic Surgery (Cardiothoracic Vascular Surgery) | Admitting: Thoracic Surgery (Cardiothoracic Vascular Surgery)

## 2013-08-31 VITALS — BP 170/90 | HR 110 | Resp 20 | Ht 60.0 in | Wt 172.0 lb

## 2013-08-31 DIAGNOSIS — I350 Nonrheumatic aortic (valve) stenosis: Secondary | ICD-10-CM

## 2013-08-31 DIAGNOSIS — I359 Nonrheumatic aortic valve disorder, unspecified: Secondary | ICD-10-CM

## 2013-08-31 DIAGNOSIS — Z952 Presence of prosthetic heart valve: Secondary | ICD-10-CM

## 2013-08-31 DIAGNOSIS — J90 Pleural effusion, not elsewhere classified: Secondary | ICD-10-CM

## 2013-08-31 DIAGNOSIS — Z954 Presence of other heart-valve replacement: Secondary | ICD-10-CM

## 2013-08-31 NOTE — Progress Notes (Signed)
HPI:  Patient returns for routine postoperative follow-up having undergone TAVR via transapical approach on 08/09/2013. The patient's early postoperative recovery while in the hospital was notable for an uncomplicated postop course. She did develop a rash on Plavix which was switched to Effient with resolution of the rash. She was sent home on oxygen due to her severe COPD. She says she has been walking a lot and had even been going without oxygen for several hrs per day. Over the past few days her breathing has not been as good and she has had some pleuritic left chest pain. She has had no fever, slight cough, and no sputum.    Current Outpatient Prescriptions  Medication Sig Dispense Refill  . albuterol (PROVENTIL HFA;VENTOLIN HFA) 108 (90 BASE) MCG/ACT inhaler Inhale 2 puffs into the lungs every 6 (six) hours as needed for wheezing or shortness of breath.  1 Inhaler  3  . aspirin EC 81 MG EC tablet Take 1 tablet (81 mg total) by mouth daily.      . furosemide (LASIX) 40 MG tablet Take 1 tablet (40 mg total) by mouth 2 (two) times daily.  60 tablet  1  . potassium chloride SA (K-DUR,KLOR-CON) 20 MEQ tablet Take 1 tablet (20 mEq total) by mouth daily.  30 tablet  3  . prasugrel (EFFIENT) 5 MG TABS tablet Take 1 tablet (5 mg total) by mouth daily.  30 tablet  6   No current facility-administered medications for this visit.    Physical Exam:  BP 170/90  Pulse 110  Resp 20  Ht 5' (1.524 m)  Wt 172 lb (78.019 kg)  BMI 33.59 kg/m2  SpO2 90%  She looks well. Lung exam reveals decreased breath sounds over the left lower lobe. Cardiac exam shows a regular rate and rhythm with normal heart sounds. Chest incision is healing well. The chest tube sutures are still in place. The groin access sites are soft. There is no peripheral edema  Diagnostic Tests:  CLINICAL DATA: Status post transcatheter aortic valve replacement.  EXAM:  CHEST 2 VIEW  COMPARISON: Chest x-ray 08/13/2013.    FINDINGS:  Compared to the prior examination there is now complete  opacification in the mid to lower left hemithorax, compatible with a  moderate to large left pleural effusion with associated atelectasis  and/or consolidation throughout the base of the left lung,  predominantly in the left lower lobe. Right lung appears clear. No  right pleural effusion. Pulmonary vasculature is within normal  limits. Cardiac silhouette is largely obscured, however, the heart  appears mildly enlarged. Upper mediastinal contours are distorted by  patient positioning, but appears similar to the prior study. An  Edwards valve is noted projecting over the aortic root.  IMPRESSION:  1. Significant increase in moderate to large left pleural effusion  with associated atelectasis and/or consolidation in the base of the  left lung. Clinical correlation for signs and symptoms of pneumonia  is recommended.  2. Resolution of previously noted right pleural effusion and right  basilar atelectasis.  3. Atherosclerosis.  4. Cardiomegaly.  5. Status post transcatheter aortic valve replacement.  .  Electronically Signed  By: Trudie Reedaniel Entrikin M.D.  On: 08/31/2013 12:35   Impression:  She is feeling much better overall but has noted some decrease in her breathing capacity over the past few days which is due to the development of a moderate to large left pleural effusion. This is probably related to surgery and may be  slowly progressing. This will require left thoracentesis. I think that once this is resolved she will be able to wean off oxygen. She is still walking well even with this effusion. She has no peripheral edema so I told her she can decrease the lasix to 40 mg once daily.  Plan:  1. We will arrange US guided left thoracentesis by IR. 2. She has a follow-up appt in valve clinic on 09/16/2013 and will have a 2D echo and a CXR at that time.

## 2013-09-02 ENCOUNTER — Ambulatory Visit (HOSPITAL_COMMUNITY)
Admission: RE | Admit: 2013-09-02 | Discharge: 2013-09-02 | Disposition: A | Discharge status: Home / Self Care | Payer: BC Managed Care – PPO | Source: Ambulatory Visit | Attending: Surgery | Admitting: Surgery

## 2013-09-02 ENCOUNTER — Ambulatory Visit (HOSPITAL_COMMUNITY)
Admission: RE | Admit: 2013-09-02 | Discharge: 2013-09-02 | Disposition: A | Discharge status: Home / Self Care | Payer: BC Managed Care – PPO | Source: Ambulatory Visit | Attending: Radiology | Admitting: Radiology

## 2013-09-02 DIAGNOSIS — Z954 Presence of other heart-valve replacement: Secondary | ICD-10-CM | POA: Insufficient documentation

## 2013-09-02 DIAGNOSIS — J90 Pleural effusion, not elsewhere classified: Secondary | ICD-10-CM

## 2013-09-02 NOTE — Procedures (Signed)
Successful US guided left thoracentesis post-op TAVR. Yielded 500cc of dark bloody fluid. Pt tolerated procedure well. No immediate complications.  Specimen was not sent for labs. CXR ordered.  Pattricia Boss D PA-C 09/02/2013 1:44 PM

## 2013-09-05 ENCOUNTER — Other Ambulatory Visit: Payer: Self-pay | Admitting: *Deleted

## 2013-09-05 DIAGNOSIS — J90 Pleural effusion, not elsewhere classified: Secondary | ICD-10-CM

## 2013-09-06 ENCOUNTER — Ambulatory Visit (INDEPENDENT_AMBULATORY_CARE_PROVIDER_SITE_OTHER): Payer: BC Managed Care – PPO | Admitting: Surgery

## 2013-09-06 ENCOUNTER — Other Ambulatory Visit: Payer: Self-pay | Admitting: *Deleted

## 2013-09-06 ENCOUNTER — Ambulatory Visit
Admission: RE | Admit: 2013-09-06 | Discharge: 2013-09-06 | Disposition: A | Discharge status: Home / Self Care | Payer: BC Managed Care – PPO | Source: Ambulatory Visit | Attending: Surgery | Admitting: Surgery

## 2013-09-06 VITALS — BP 159/82 | HR 104 | Resp 20 | Ht 60.0 in | Wt 172.0 lb

## 2013-09-06 DIAGNOSIS — J90 Pleural effusion, not elsewhere classified: Secondary | ICD-10-CM

## 2013-09-06 DIAGNOSIS — Z954 Presence of other heart-valve replacement: Secondary | ICD-10-CM

## 2013-09-06 DIAGNOSIS — Z952 Presence of prosthetic heart valve: Secondary | ICD-10-CM

## 2013-09-06 DIAGNOSIS — I350 Nonrheumatic aortic (valve) stenosis: Secondary | ICD-10-CM

## 2013-09-06 DIAGNOSIS — I359 Nonrheumatic aortic valve disorder, unspecified: Secondary | ICD-10-CM

## 2013-09-07 ENCOUNTER — Ambulatory Visit (HOSPITAL_COMMUNITY)
Admission: RE | Admit: 2013-09-07 | Discharge: 2013-09-07 | Disposition: A | Discharge status: Home / Self Care | Payer: BC Managed Care – PPO | Source: Ambulatory Visit | Attending: Radiology | Admitting: Radiology

## 2013-09-07 ENCOUNTER — Encounter: Payer: Self-pay | Admitting: Surgery

## 2013-09-07 ENCOUNTER — Ambulatory Visit (HOSPITAL_COMMUNITY)
Admission: RE | Admit: 2013-09-07 | Discharge: 2013-09-07 | Disposition: A | Discharge status: Home / Self Care | Payer: BC Managed Care – PPO | Source: Ambulatory Visit | Attending: Surgery | Admitting: Surgery

## 2013-09-07 DIAGNOSIS — I359 Nonrheumatic aortic valve disorder, unspecified: Secondary | ICD-10-CM

## 2013-09-07 DIAGNOSIS — Z954 Presence of other heart-valve replacement: Secondary | ICD-10-CM | POA: Insufficient documentation

## 2013-09-07 DIAGNOSIS — J90 Pleural effusion, not elsewhere classified: Secondary | ICD-10-CM

## 2013-09-07 NOTE — Procedures (Signed)
Successful US guided left thoracentesis. Yielded 1.2L of dark bloody pleural fluid. Pt tolerated procedure well. No immediate complications.  Specimen was not sent for labs. CXR ordered.  Brayton El PA-C 09/07/2013 11:25 AM

## 2013-09-07 NOTE — Progress Notes (Signed)
HPI:  Patient returns for routine postoperative follow-up having undergone TAVR via transapical approach on 08/09/2013.  The patient's early postoperative recovery while in the hospital was notable for an uncomplicated postop course. She did develop a rash on Plavix which was switched to Effient with resolution of the rash. She was sent home on oxygen due to her severe COPD. She says she has been walking a lot and had even been going without oxygen for several hrs per day. I saw her on 08/31/2013 and she was having some shortness of breath with activity and was noted to have a moderate left pleural effusion that was new. She underwent thoracentesis on 09/02/2013 removing 500 cc of old bloody fluid. Her post-procedure chest xray still showed haziness in the left hemithorax that was either residual effusion or left lower lobe atelectasis or consolidation. She says she feels about the same. She is fine at rest but gets short of breath with activity.    Current Outpatient Prescriptions  Medication Sig Dispense Refill  . albuterol (PROVENTIL HFA;VENTOLIN HFA) 108 (90 BASE) MCG/ACT inhaler Inhale 2 puffs into the lungs every 6 (six) hours as needed for wheezing or shortness of breath.  1 Inhaler  3  . aspirin EC 81 MG EC tablet Take 1 tablet (81 mg total) by mouth daily.      . furosemide (LASIX) 40 MG tablet Take 40 mg by mouth daily.      . potassium chloride SA (K-DUR,KLOR-CON) 20 MEQ tablet Take 1 tablet (20 mEq total) by mouth daily.  30 tablet  3  . prasugrel (EFFIENT) 5 MG TABS tablet Take 1 tablet (5 mg total) by mouth daily.  30 tablet  6   No current facility-administered medications for this visit.     Physical Exam: BP 159/82  Pulse 104  Resp 20  Ht 5' (1.524 m)  Wt 172 lb (78.019 kg)  BMI 33.59 kg/m2  SpO2 95% She looks well and in no distress. Using nasal oxygen. Lung exam reveals marked decrease in breath sounds in the left lower lobe Cardiac exam reveals a regular rate  and rhythm with normal valve sounds, no murmur  Diagnostic Tests:  CLINICAL DATA: Evaluate left-sided pleural effusion  EXAM:  CHEST 2 VIEW  COMPARISON: 09/02/2013; 08/31/2013; ultrasound-guided left-sided  thoracentesis -09/02/2013  FINDINGS:  Grossly unchanged cardiac silhouette and mediastinal contours with  tortuosity and possible ectasia of the thoracic aorta. Post  percutaneous aortic valve replacement. Interval increase in now  moderate sized left-sided effusion with associated worsening left  mid and lower lung heterogeneous/consolidative opacities. No  definite right-sided pleural effusion. No evidence of edema. No  pneumothorax. Unchanged bones.  IMPRESSION:  Interval increase and now moderate size left-sided effusion with  associated worsening left basilar atelectasis.  Electronically Signed  By: Simonne Come M.D.  On: 09/06/2013 14:29   Impression:  She has persistent opacity at the left base and lower lobe that was no present prior to discharge. The effusion component is clearly larger and back to where it was a week ago before thoracentesis. I think there is also significant LLL atelectasis. I think another attempt at left thoracentesis to drain all of the fluid is indicated, followed by using IS. If this does not resolve she will need a left VATS to drain the fluid and allow reexpansion of the LLL. Unfortunately this will require general anesthesia in this patient with severe  COPD but the LLL has to be reexpanded.   Plan:  She will have left thoracentesis under US guidance by IR later this week. She will return to see us in valve clinic on 1/23 with a CXR.

## 2013-09-16 ENCOUNTER — Encounter (HOSPITAL_COMMUNITY): Payer: Self-pay | Admitting: Surgery

## 2013-09-16 ENCOUNTER — Encounter (HOSPITAL_COMMUNITY): Payer: Self-pay | Admitting: Cardiovascular Disease

## 2013-09-16 ENCOUNTER — Ambulatory Visit (HOSPITAL_COMMUNITY)
Admit: 2013-09-16 | Discharge: 2013-09-16 | Disposition: A | Discharge status: Home / Self Care | Payer: BC Managed Care – PPO | Attending: Family Medicine | Admitting: Family Medicine

## 2013-09-16 ENCOUNTER — Ambulatory Visit (HOSPITAL_COMMUNITY)
Admission: RE | Admit: 2013-09-16 | Discharge: 2013-09-16 | Disposition: A | Discharge status: Home / Self Care | Payer: BC Managed Care – PPO | Source: Ambulatory Visit | Attending: Surgery | Admitting: Surgery

## 2013-09-16 ENCOUNTER — Ambulatory Visit (HOSPITAL_BASED_OUTPATIENT_CLINIC_OR_DEPARTMENT_OTHER)
Admit: 2013-09-16 | Discharge: 2013-09-16 | Disposition: A | Discharge status: Home / Self Care | Payer: BC Managed Care – PPO | Attending: Cardiovascular Disease | Admitting: Cardiovascular Disease

## 2013-09-16 ENCOUNTER — Ambulatory Visit (HOSPITAL_COMMUNITY)
Admit: 2013-09-16 | Discharge: 2013-09-16 | Disposition: A | Discharge status: Home / Self Care | Payer: BC Managed Care – PPO | Attending: Surgery | Admitting: Surgery

## 2013-09-16 VITALS — BP 132/66 | HR 85 | Resp 19 | Ht 60.0 in | Wt 172.0 lb

## 2013-09-16 DIAGNOSIS — I509 Heart failure, unspecified: Secondary | ICD-10-CM | POA: Insufficient documentation

## 2013-09-16 DIAGNOSIS — I5022 Chronic systolic (congestive) heart failure: Secondary | ICD-10-CM

## 2013-09-16 DIAGNOSIS — Z952 Presence of prosthetic heart valve: Secondary | ICD-10-CM

## 2013-09-16 DIAGNOSIS — I517 Cardiomegaly: Secondary | ICD-10-CM

## 2013-09-16 DIAGNOSIS — I359 Nonrheumatic aortic valve disorder, unspecified: Secondary | ICD-10-CM

## 2013-09-16 DIAGNOSIS — I428 Other cardiomyopathies: Secondary | ICD-10-CM | POA: Insufficient documentation

## 2013-09-16 DIAGNOSIS — Z954 Presence of other heart-valve replacement: Secondary | ICD-10-CM

## 2013-09-16 DIAGNOSIS — I350 Nonrheumatic aortic (valve) stenosis: Secondary | ICD-10-CM

## 2013-09-16 NOTE — Progress Notes (Signed)
  Echocardiogram 2D Echocardiogram has been performed.  Cathie Beams 09/16/2013, 2:57 PM

## 2013-09-16 NOTE — Addendum Note (Signed)
Encounter addended by: Kathleene Hazel, MD on: 09/16/2013  3:56 PM<BR>     Documentation filed: Visit Diagnoses, Follow-up Section, LOS Section, Notes Section

## 2013-09-16 NOTE — Progress Notes (Addendum)
MULTIDISCIPLINARY HEART VALVE CLINIC NOTE  Patient ID: Cathy Moody MRN: [id number] DOB/AGE: February 06, 1951 63 y.o.  Primary Care Physician:LALONDE,JOHN CHARLES, MD Primary Cardiologist: Eldridge Dace  HPI: 63 year old white female with history of severe aortic valve stenosis s/p TAVR, chronic systolic CHF, non-ischemic cardiomyopathy, tobacco abuse, COPD who is here today for follow up after her TAVR procedure in the multi-disciplinary valve clinic. She was admitted to [facility] 07/29/13 with 6 week history of profound dyspnea with minimal exertion. She was found to be volume overloaded. On admission her BNP was 40981. Echo showed severe calcific AS with an AVA of 0.61 cm2 by VTI and 0.49 cm2 by Vmax. The mean gradient was 47 mm Hg with a peak gradient of 80 mm Hg. Her LVEF was severely reduced at 25% with diffuse hypokinesis and mild to moderate MR. Systolic PAP was severely elevated at 64. Left heart catheterization showed no obstructive CAD. Given her severe COPD, pulmonary HTN and other comorbidities, she was not felt to be an open surgical candidate. After consultation with Dr. Laneta Simmers and Dr. Excell Seltzer, the multi-disciplinary valve team felt that she would be a favorable candidate for TAVR. The TAVR procedure was performed on 08/09/13. Her post-operative course has been complicated by a left pleural effusion and rash on Plavix. She has done well on Effient. She was seen in the CT surgery office on 08/31/13 by Dr. Laneta Simmers and an u/s guided thoracentesis was arranged on 09/02/13. 500 cc bloody fluid was removed. She was seen on 09/06/13 by Dr. Laneta Simmers and given reaccumulation of her pleural effusion, u/s guided thoracentesis was repeated on 09/07/13 with 1.2 liters bloody fluid removed.   She is here today for follow up. She is feeling well overall. Breathing is much improved since her last thoracentesis on 09/07/13. No chest pain. No lower extremity edema. Weight is stable.    Past Medical History  Diagnosis  Date  . Asthma   . Heart murmur   . Acute CHF     [name] 07/29/2013  . Alcohol abuse     /notes 07/29/2013  . Hypotension     Past Surgical History  Procedure Laterality Date  . Transcatheter aortic valve replacement, transapical Left 08/09/2013    Procedure: TRANSCATHETER AORTIC VALVE REPLACEMENT, TRANSAPICAL;  Surgeon: Alleen Borne, MD;  Location: [facility] OR;  Service: Open Heart Surgery;  Laterality: Left;  . Intraoperative transesophageal echocardiogram N/A 08/09/2013    Procedure: INTRAOPERATIVE TRANSESOPHAGEAL ECHOCARDIOGRAM;  Surgeon: Alleen Borne, MD;  Location: [facility] OR;  Service: Open Heart Surgery;  Laterality: N/A;    Family History  Problem Relation Age of Onset  . Diabetes Paternal Grandmother     History   Social History  . Marital Status: Divorced    Spouse Name: N/A    Number of Children: N/A  . Years of Education: N/A   Occupational History  . Not on file.   Social History Main Topics  . Smoking status: Current Some Day Smoker    Types: Cigarettes  . Smokeless tobacco: Not on file  . Alcohol Use: Yes     Comment: Pt is weekend drinker  . Drug Use: No  . Sexual Activity: Not on file   Other Topics Concern  . Not on file   Social History Narrative  . No narrative on file    Current Medications:  Current Outpatient Prescriptions on File Prior to Encounter  Medication Sig Dispense Refill  . albuterol (PROVENTIL HFA;VENTOLIN HFA) 108 (90 BASE) MCG/ACT inhaler Inhale  2 puffs into the lungs every 6 (six) hours as needed for wheezing or shortness of breath.  1 Inhaler  3  . aspirin EC 81 MG EC tablet Take 1 tablet (81 mg total) by mouth daily.      . furosemide (LASIX) 40 MG tablet Take 40 mg by mouth daily.      . potassium chloride SA (K-DUR,KLOR-CON) 20 MEQ tablet Take 1 tablet (20 mEq total) by mouth daily.  30 tablet  3  . prasugrel (EFFIENT) 5 MG TABS tablet Take 1 tablet (5 mg total) by mouth daily.  30 tablet  6   No current facility-administered  medications on file prior to encounter.     Allergies  Allergen Reactions  . Plavix [Clopidogrel Bisulfate] Rash    ROS:  General: no fevers/chills/night sweats Eyes: no blurry vision, diplopia, or amaurosis ENT: no sore throat or hearing loss Resp: no cough, wheezing, or hemoptysis CV: no edema or palpitations GI: no abdominal pain, nausea, vomiting, diarrhea, or constipation GU: no dysuria, frequency, or hematuria Skin: no rash Neuro: no headache, numbness, tingling, or weakness of extremities Musculoskeletal: no joint pain or swelling Heme: no bleeding, DVT, or easy bruising Endo: no polydipsia or polyuria  BP 132/66  Pulse 85  Resp 19  Ht 5' (1.524 m)  Wt 172 lb (78.019 kg)  BMI 33.59 kg/m2  SpO2 94%  PHYSICAL EXAM: Pt is alert and oriented, WD, WN, in no distress. HEENT: normal Neck: JVP normal. Carotid upstrokes normal without bruits. No thyromegaly. Lungs: equal expansion, clear right lung, decreased breath sounds left base CV: RRR without murmur or gallop Abd: soft, NT, +BS, no bruit, no hepatosplenomegaly Back: no CVA tenderness Ext: no C/C/E Skin: warm and dry without rash Neuro: CNII-XII intact             2D ECHO: 09/16/13: Left ventricle: The cavity size was normal. Wall thickness was increased in a pattern of moderate LVH. Systolic function was normal. The estimated ejection fraction was in the range of 55% to 60%. Although no diagnostic regional wall motion abnormality was identified, this possibility cannot be completely excluded on the basis of this study. Doppler parameters are consistent with abnormal left ventricular relaxation (grade 1 diastolic dysfunction). - Aortic valve: The patient is s/p TAVR. Minimal gradient across aortic valve. No regurgitation. Mean gradient: 8mm Hg (S). - Mitral valve: Moderately calcified annulus. - Left atrium: The atrium was mildly dilated. - Right ventricle: The cavity size was normal. Systolic function was  normal. - Tricuspid valve: Peak RV-RA gradient: 24mm Hg (S). - Pulmonary arteries: PA peak pressure: 27mm Hg (S). - Inferior vena cava: The vessel was normal in size; the respirophasic diameter changes were in the normal range (= 50%); findings are consistent with normal central venous pressure. Impressions: - Normal LV size with moderate LV hypertrophy. EF 55-60%. The patient is s/p TAVR, minimal gradient across valve and no AI detected. Normal RV size and systolic function.    ASSESSMENT AND PLAN:  1. Severe aortic valve stenosis s/p TAVR: She is doing well. She has mild limitation because of dyspnea which is likely a combination of her underlying lung disease and persistent left pleural effusion. This is much improved. Will continue Effient. She will call if she has worsened dyspnea. Will not plan repeat CXR at this time. Close follow up in next 6 weeks in our cardiology office with Dr. Eldridge DaceVaranasi.   2. Non-ischemic cardiomyopathy: LVEF is normalized by echo today. No changes today.  Beta blocker and ARB/Ace-inh not started in hospital secondary to labile blood pressures. Would consider adding Ace-inh or ARB at next f/u visit.    3. Chronic systolic CHF: Volume status appears to be normal today. Weight is stable. Continue Lasix.   4. Left pleural effusion: Effusion is smaller post thoracentesis. Symptomatically improved. Reviewed with Dr. Laneta Simmers. Will follow.

## 2013-09-16 NOTE — Progress Notes (Signed)
Subjective:    Patient ID: Cathy Moody, female    DOB: 10/25/1950, 63 y.o.   MRN: [id number]  HPI  Patient returns for routine postoperative follow-up having undergone TAVR via transapical approach on 08/09/2013. She is a 63 year old white female with a long history of heavy smoking, chronic shortness of breath for years probably related to COPD, who also has a long history of a heart murmur. She reports at least a 6 week history of worsening shortness of breath with exertion that has progressed to the point that she is getting short of breath going to the bathroom. She has also developed marked swelling in her legs. On admission her BNP was [id number]. Initial Troponin I poc was .07 and subsequent follow-up levels were < 0.3. A 2 D echo showed severe calcific AS with an AVA of 0.61 cm2 by VTI and 0.49 cm2 by Vmax. The mean gradient was 47 mm Hg with a peak gradient of 80 mm Hg. Her EF is severely reduced at 25% with diffuse hypokinesis and mild to moderate MR. Systolic PAP was severely elevated at 64. The LVOT diameter was only 19 mm. She underwent a R&L heart cath showing no coronary disease, severe pulmonary HTN with PA 65/31 with a PCWP mean of 31, RA of 14, and CO of 3.07 L/min with a BSA of 1.8. She was not felt to be a traditional surgical AVR candidate due to severe COPD, morbid obesity and low EF. She had an uncomplicated postop course in the hospital. She was sent home on oxygen due to her severe COPD. She was walking a lot and had even been going without oxygen for several hrs per day. I saw her on 08/31/2013 and she was having some shortness of breath with activity and was noted to have a moderate left pleural effusion that was new. She underwent thoracentesis on 09/02/2013 removing 500 cc of old bloody fluid. Her post-procedure chest xray still showed haziness in the left hemithorax that was either residual effusion or left lower lobe atelectasis or consolidation. She returned reporting that she felt  about the same. She was fine at rest but got short of breath with activity. She underwent a second left thoracentesis last week removing 1.3L of fluid. She says her breathing markedly improved and remains quite good. She is going out to walk in stores without oxygen.    Review of Systems  Constitutional: Negative for fatigue.  Respiratory: Negative for chest tightness and shortness of breath.   Cardiovascular: Negative for chest pain and leg swelling.  Neurological: Negative for dizziness, syncope and light-headedness.       Objective:   Physical Exam  Constitutional: She is oriented to person, place, and time. She appears well-developed and well-nourished. No distress.  HENT:  Head: Normocephalic and atraumatic.  Mouth/Throat: Oropharynx is clear and moist.  Eyes: EOM are normal. Pupils are equal, round, and reactive to light.  Neck: No JVD present.  Cardiovascular: Normal rate, regular rhythm and normal heart sounds.   No murmur heard. Pulmonary/Chest: Effort normal.  Slight decreased breath sounds at left base compared to right.  Neurological: She is alert and oriented to person, place, and time.   Tressie Ellis*[place]* *[facility]* [address] [place], [place] [address] [phone]  ------------------------------------------------------------ Transthoracic Echocardiography  Patient: Cathy Moody, Cathy Moody MR #: [id number] Study Date: 09/16/2013 Gender: F Age: 63 Height: 152.4cm Weight: 78.2kg BSA: 1.2298m^2 Pt. Status: Room:  ATTENDING Carollee HerterLalonde, John Charles SONOGRAPHER Cathie BeamsGregory, Angela  Duanne Moron, Casimiro Needle REFERRING Tysinger, David PERFORMING [facility], Outpatient cc:  ------------------------------------------------------------ LV EF: 55% - 60%  ------------------------------------------------------------ Indications: Previous study 08/10/2013.  ------------------------------------------------------------ History: PMH: TAVR. Aortic valve disorder -  424.1.  ------------------------------------------------------------ Study Conclusions  - Left ventricle: The cavity size was normal. Wall thickness was increased in a pattern of moderate LVH. Systolic function was normal. The estimated ejection fraction was in the range of 55% to 60%. Although no diagnostic regional wall motion abnormality was identified, this possibility cannot be completely excluded on the basis of this study. Doppler parameters are consistent with abnormal left ventricular relaxation (grade 1 diastolic dysfunction). - Aortic valve: The patient is s/p TAVR. Minimal gradient across aortic valve. No regurgitation. Mean gradient: 39mm Hg (S). - Mitral valve: Moderately calcified annulus. - Left atrium: The atrium was mildly dilated. - Right ventricle: The cavity size was normal. Systolic function was normal. - Tricuspid valve: Peak RV-RA gradient: 58mm Hg (S). - Pulmonary arteries: PA peak pressure: 29mm Hg (S). - Inferior vena cava: The vessel was normal in size; the respirophasic diameter changes were in the normal range (= 50%); findings are consistent with normal central venous pressure. Impressions:  - Normal LV size with moderate LV hypertrophy. EF 55-60%. The patient is s/p TAVR, minimal gradient across valve and no AI detected. Normal RV size and systolic function. Transthoracic echocardiography. M-mode, complete 2D, spectral Doppler, and color Doppler. Height: Height: 152.4cm. Height: 60in. Weight: Weight: 78.2kg. Weight: 172lb. Body mass index: BMI: 33.7kg/m^2. Body surface area: BSA: 1.31m^2. Blood pressure: 136/76. Patient status: Outpatient. Location: Echo laboratory.  ------------------------------------------------------------  ------------------------------------------------------------ Left ventricle: The cavity size was normal. Wall thickness was increased in a pattern of moderate LVH. Systolic function was normal. The estimated ejection  fraction was in the range of 55% to 60%. Although no diagnostic regional wall motion abnormality was identified, this possibility cannot be completely excluded on the basis of this study. Doppler parameters are consistent with abnormal left ventricular relaxation (grade 1 diastolic dysfunction).  ------------------------------------------------------------ Aortic valve: The patient is s/p TAVR. Minimal gradient across aortic valve. Doppler: No regurgitation. Peak velocity ratio of LVOT to aortic valve: 0.61. Mean velocity ratio of LVOT to aortic valve: 0.66. Mean gradient: 54mm Hg (S). Peak gradient: 6mm Hg (S).  ------------------------------------------------------------ Aorta: Aortic root: The aortic root was normal in size. Ascending aorta: The ascending aorta was normal in size.  ------------------------------------------------------------ Mitral valve: Moderately calcified annulus. Doppler: There was no evidence for stenosis. Peak gradient: 68mm Hg (D).  ------------------------------------------------------------ Left atrium: The atrium was mildly dilated.  ------------------------------------------------------------ Right ventricle: The cavity size was normal. Systolic function was normal.  ------------------------------------------------------------ Pulmonic valve: Structurally normal valve. Cusp separation was normal. Doppler: Transvalvular velocity was within the normal range. No regurgitation.  ------------------------------------------------------------ Tricuspid valve: Doppler: Trivial regurgitation.  ------------------------------------------------------------ Right atrium: The atrium was normal in size.  ------------------------------------------------------------ Pericardium: There was no pericardial effusion.  ------------------------------------------------------------ Systemic veins: Inferior vena cava: The vessel was normal in size; the respirophasic  diameter changes were in the normal range (= 50%); findings are consistent with normal central venous pressure.  ------------------------------------------------------------  2D measurements Normal Doppler Normal Left ventricle measurements LVID ED, 37.8 mm 43-52 Main pulmonary chord, artery PLAX Pressure, S 27 mm =30 LVID ES, 25.3 mm 23-38 Hg chord, Left ventricle PLAX Ea, lat 7.18 cm/ ------- FS, chord, 33 % >29 ann, tiss s PLAX DP LVPW, ED 16.1 mm ------ E/Ea, lat 11.69 ------- IVS/LVPW 1.11 <1.3 ann, tiss ratio, ED DP Ventricular septum Ea, med  5.55 cm/ ------- IVS, ED 17.8 mm ------ ann, tiss s Left atrium DP AP dim 33 mm ------ E/Ea, med 15.12 ------- AP dim 1.78 cm/m^2 <2.2 ann, tiss index DP LVOT Peak vel, S 128 cm/ ------- s Mean vel, S 82.1 cm/ ------- s Peak 7 mm ------- gradient, S Hg Aortic valve Peak vel, S 209 cm/ ------- s Mean vel, S 125 cm/ ------- s VTI, S 33.8 cm ------- Mean 8 mm ------- gradient, S Hg Peak 17 mm ------- gradient, S Hg Peak vel 0.61 ------- ratio, LVOT/AV Vmean ratio 0.66 ------- LVOT/AV Mitral valve Peak E vel 83.9 cm/ ------- s Peak A vel 85.9 cm/ ------- s Deceleratio 194 ms 150-230 n time Peak 3 mm ------- gradient, D Hg Peak E/A 1 ------- ratio Tricuspid valve Regurg peak 243 cm/ ------- vel s Peak RV-RA 24 mm ------- gradient, S Hg Max regurg 243 cm/ ------- vel s Systemic veins Estimated 3 mm ------- CVP Hg Right ventricle Sa vel, lat 11.2 cm/ ------- ann, tiss s DP  ------------------------------------------------------------ Prepared and Electronically Authenticated by  Marca Ancona 2015-01-23T15:[id number]:[id number]    CLINICAL DATA: Postop aortic valve replacement  EXAM:  CHEST 2 VIEW  COMPARISON: 09/07/2013  FINDINGS:  Cardiomegaly again noted. No pulmonary edema. Bony thorax is stable.  Persistent small left pleural effusion with left basilar atelectasis  or scarring.  IMPRESSION:  No  pulmonary edema. Persistent small left pleural effusion left  basilar atelectasis or scarring.  Electronically Signed  By: Natasha Mead M.D.  On: 09/16/2013 14:05   Assessment & Plan:   She is doing well following TAVR. Her breathing is markedly improved postop and since the pleural effusion has been drained she says she feels better than she has in many years. Her CXR looks ok today with minimal residual left pleural effusion and left base atelectasis. I don't think she needs a follow -up CXR unless her breathing worsens. I told her to resume her Effient which was held in case she required surgical drainage of the effusion. She will continue follow-up with Dr. Eldridge Dace and will return to valve clinic in 1 year with an echo.

## 2013-09-16 NOTE — Patient Instructions (Signed)
-  Continue Effient.  -We will see you back in 1 year (Dec 2015) in TAVR clinic with echocardiogram.  -Dr. Earmon Phoenix office will call you with an appt in 6 weeks.  -Call Dr. Sharee Pimple office at [phone] if you get SOB or any chest pain.

## 2013-09-20 ENCOUNTER — Telehealth: Payer: Self-pay | Admitting: Nurse Practitioner

## 2013-09-20 NOTE — Telephone Encounter (Signed)
Called patient to inform her of follow up appointment with Dr. Eldridge Dace 3/5 at 1100 per Dr. Earmon Phoenix advice.  Patient verbalized understanding and agreement.

## 2013-10-27 ENCOUNTER — Ambulatory Visit (INDEPENDENT_AMBULATORY_CARE_PROVIDER_SITE_OTHER): Payer: BC Managed Care – PPO | Admitting: Interventional Cardiology

## 2013-10-27 ENCOUNTER — Encounter (INDEPENDENT_AMBULATORY_CARE_PROVIDER_SITE_OTHER): Payer: Self-pay

## 2013-10-27 ENCOUNTER — Encounter: Payer: Self-pay | Admitting: Interventional Cardiology

## 2013-10-27 VITALS — BP 164/84 | HR 86 | Ht 60.0 in | Wt 174.0 lb

## 2013-10-27 DIAGNOSIS — I5021 Acute systolic (congestive) heart failure: Secondary | ICD-10-CM

## 2013-10-27 DIAGNOSIS — Z954 Presence of other heart-valve replacement: Secondary | ICD-10-CM

## 2013-10-27 DIAGNOSIS — R030 Elevated blood-pressure reading, without diagnosis of hypertension: Secondary | ICD-10-CM | POA: Insufficient documentation

## 2013-10-27 DIAGNOSIS — Z952 Presence of prosthetic heart valve: Secondary | ICD-10-CM

## 2013-10-27 DIAGNOSIS — F17200 Nicotine dependence, unspecified, uncomplicated: Secondary | ICD-10-CM

## 2013-10-27 DIAGNOSIS — Z720 Tobacco use: Secondary | ICD-10-CM

## 2013-10-27 MED ORDER — POTASSIUM CHLORIDE CRYS ER 20 MEQ PO TBCR: 20.0000 meq | EXTENDED_RELEASE_TABLET | Freq: Every day | ORAL | Status: DC

## 2013-10-27 MED ORDER — FUROSEMIDE 40 MG PO TABS: 40.0000 mg | ORAL_TABLET | Freq: Every day | ORAL | Status: DC

## 2013-10-27 NOTE — Patient Instructions (Signed)
Refilled your potassium and lasix.  Your physician has requested that you regularly monitor and record your blood pressure readings at home. Please use the same machine at the same time of day to check your readings and record them. Call in 2-3 weeks with BP readings.  Your physician wants you to follow-up in: 6 months with Dr. Eldridge Dace. You will receive a reminder letter in the mail two months in advance. If you don't receive a letter, please call our office to schedule the follow-up appointment.

## 2013-10-27 NOTE — Progress Notes (Signed)
Patient ID: Cathy Moody, female   DOB: 01-08-1951, 63 y.o.   MRN: [id number]    [address] [place], [place]  [address] Phone: [phone] Fax:  [phone]  Date:  10/27/2013   ID:  Cathy Moody, DOB [birth year], MRN [id number]  PCP:  Carollee Herter, MD      History of Present Illness: Cathy Moody is a 63 y.o. female who presented in 12/14 with CHF.  She was found to have severe AS and acute systolic heart failure.  She was diuresed and then had TAVR.  Her LV function has improved since 1/15.  It is now 55-60%.  She is feeling well.  Her only complaint is having urinate a lot after the dose of the Lasix every morning. Overall, she feels quite well. She denies any chest discomfort or shortness of breath with walking. She has had a recent URI. She sleeps well. She is able to lie flat without any problems. She is taking antibiotics when she goes to the dentist.     Wt Readings from Last 3 Encounters:  10/27/13 174 lb (78.926 kg)  09/16/13 172 lb (78.019 kg)  09/16/13 172 lb (78.019 kg)     Past Medical History  Diagnosis Date  . Asthma   . Heart murmur   . Acute CHF     Cathy Moody 07/29/2013  . Alcohol abuse     /notes 07/29/2013  . Hypotension     Current Outpatient Prescriptions  Medication Sig Dispense Refill  . albuterol (PROVENTIL HFA;VENTOLIN HFA) 108 (90 BASE) MCG/ACT inhaler Inhale 2 puffs into the lungs every 6 (six) hours as needed for wheezing or shortness of breath.  1 Inhaler  3  . aspirin EC 81 MG EC tablet Take 1 tablet (81 mg total) by mouth daily.      . furosemide (LASIX) 40 MG tablet Take 40 mg by mouth daily.      . potassium chloride SA (K-DUR,KLOR-CON) 20 MEQ tablet Take 1 tablet (20 mEq total) by mouth daily.  30 tablet  3  . prasugrel (EFFIENT) 5 MG TABS tablet Take 1 tablet (5 mg total) by mouth daily.  30 tablet  6   No current facility-administered medications for this visit.    Allergies:    Allergies  Allergen Reactions  .  Plavix [Clopidogrel Bisulfate] Rash    Social History:  The patient  reports that she quit smoking about 2 months ago. Her smoking use included Cigarettes. She smoked 0.00 packs per day. She does not have any smokeless tobacco history on file. She reports that she drinks alcohol. She reports that she does not use illicit drugs.   Family History:  The patient's family history includes Diabetes in her paternal grandmother.   ROS:  Please see the history of present illness.  No nausea, vomiting.  No fevers, chills.  No focal weakness.  No dysuria.    All other systems reviewed and negative.   PHYSICAL EXAM: VS:  BP 164/84  Pulse 86  Ht 5' (1.524 m)  Wt 174 lb (78.926 kg)  BMI 33.98 kg/m2  SpO2 93% Well nourished, well developed, in no acute distress HEENT: normal Neck: no JVD, no carotid bruits Cardiac:  normal S1, S2; RRR;  Lungs:  clear to auscultation bilaterally, no wheezing, rhonchi or rales Abd: soft, nontender, no hepatomegaly Ext: 1+ lower extremity edema Skin: warm and dry Neuro:   no focal abnormalities noted     ASSESSMENT AND  PLAN:  1. Aortic stenosis: Status post TAVR.  Doing very well. EF has returned to normal. No heart failure symptoms.  Continue dual antiplatelet therapy for 6 months. Stop Effient in June 2015. Would continue aspirin after that point. 2. 152/74 on recheck. Have asked her to check her blood pressure at home. If her blood pressure remains high, we may need to add lisinopril. She states that usually her blood pressure is well controlled.   3. Needs refills for potassium, lasix.  Continue Lasix given the edema noted on exam.  Signed, Fredric MareJay S. Varanasi, MD, [facility] 10/27/2013 11:40 AM

## 2013-11-18 ENCOUNTER — Telehealth: Payer: Self-pay | Admitting: Interventional Cardiology

## 2013-11-18 NOTE — Telephone Encounter (Signed)
Pt.notified

## 2013-11-18 NOTE — Telephone Encounter (Signed)
BP controled.  COntinue current meds.

## 2013-11-18 NOTE — Telephone Encounter (Signed)
To Dr Varanasi for review 

## 2013-11-18 NOTE — Telephone Encounter (Signed)
New message    Patient calling with blood pressure reading    3/7   125/80 3/11 129/84 3/14 127/80 3/17 128/81 3/23 129/78 3/25 127/80 3/27 128/84

## 2013-12-19 ENCOUNTER — Telehealth (HOSPITAL_COMMUNITY): Payer: Self-pay | Admitting: Cardiac Rehabilitation

## 2013-12-19 NOTE — Telephone Encounter (Signed)
Several attempts to contact pt from January through March, left messages and letter mailed with no response. Had the opportunity to speak with pt today. Pt declined offer fo cardiac rehab as she is exercising on her own

## 2014-01-11 ENCOUNTER — Other Ambulatory Visit: Payer: Self-pay | Admitting: Physician Assistant

## 2014-03-26 ENCOUNTER — Other Ambulatory Visit: Payer: Self-pay | Admitting: Cardiovascular Disease

## 2014-03-27 ENCOUNTER — Other Ambulatory Visit: Payer: Self-pay | Admitting: *Deleted

## 2014-03-27 MED ORDER — PRASUGREL HCL 5 MG PO TABS: 5.0000 mg | ORAL_TABLET | Freq: Every day | ORAL | Status: DC

## 2014-05-11 ENCOUNTER — Encounter: Payer: Self-pay | Admitting: Interventional Cardiology

## 2014-05-11 ENCOUNTER — Ambulatory Visit (INDEPENDENT_AMBULATORY_CARE_PROVIDER_SITE_OTHER): Payer: BC Managed Care – PPO | Admitting: Interventional Cardiology

## 2014-05-11 VITALS — BP 128/93 | HR 94 | Ht 60.0 in | Wt 203.0 lb

## 2014-05-11 DIAGNOSIS — Z954 Presence of other heart-valve replacement: Secondary | ICD-10-CM

## 2014-05-11 DIAGNOSIS — R609 Edema, unspecified: Secondary | ICD-10-CM

## 2014-05-11 DIAGNOSIS — Z952 Presence of prosthetic heart valve: Secondary | ICD-10-CM

## 2014-05-11 MED ORDER — FUROSEMIDE 40 MG PO TABS: 20.0000 mg | ORAL_TABLET | Freq: Every day | ORAL | Status: DC

## 2014-05-11 NOTE — Patient Instructions (Signed)
Your physician has recommended you make the following change in your medication:  1. Stop Effient.   2. Can take lasix 20-40 mg as needed based on swelling.   Your physician wants you to follow-up in: 6 months with Dr. Eldridge Dace. You will receive a reminder letter in the mail two months in advance. If you don't receive a letter, please call our office to schedule the follow-up appointment.

## 2014-05-11 NOTE — Progress Notes (Signed)
Patient ID: Cathy Moody, female   DOB: 1951/07/13, 63 y.o.   MRN: [id number] Patient ID: Cathy Moody, female   DOB: 04-30-1951, 63 y.o.   MRN: [id number]    [address] [place], [place]  [address] Phone: [phone] Fax:  [phone]  Date:  05/11/2014   ID:  Cathy Moody, DOB April 16, 1951, MRN [id number]  PCP:  Carollee Herter, MD      History of Present Illness: Cathy Moody is a 63 y.o. female who presented in 12/14 with CHF.  She was found to have severe AS and acute systolic heart failure.  She was diuresed and then had TAVR.  Her LV function has improved since 1/15.  It is now 55-60%.  She is feeling well.  Her only complaint is having urinate a lot after the dose of the Lasix every morning. Overall, she feels quite well. She denies any chest discomfort or shortness of breath with walking. She has had a recent URI/allergies. She sleeps well. She is able to lie flat without any problems. She is taking antibiotics when she goes to the dentist.    Wants to start walking soon.    Wt Readings from Last 3 Encounters:  05/11/14 203 lb (92.08 kg)  10/27/13 174 lb (78.926 kg)  09/16/13 172 lb (78.019 kg)     Past Medical History  Diagnosis Date  . Asthma   . Heart murmur   . Acute CHF     [name] 07/29/2013  . Alcohol abuse     /notes 07/29/2013  . Hypotension     Current Outpatient Prescriptions  Medication Sig Dispense Refill  . albuterol (PROVENTIL HFA;VENTOLIN HFA) 108 (90 BASE) MCG/ACT inhaler Inhale 2 puffs into the lungs every 6 (six) hours as needed for wheezing or shortness of breath.  1 Inhaler  3  . aspirin EC 81 MG EC tablet Take 1 tablet (81 mg total) by mouth daily.      . cyclobenzaprine (FLEXERIL) 5 MG tablet hasn't started using      . furosemide (LASIX) 40 MG tablet Take 0.5-1 tablets (20-40 mg total) by mouth daily. titrating dose as needed  30 tablet  6  . potassium chloride SA (K-DUR,KLOR-CON) 20 MEQ tablet Take 1 tablet (20 mEq total) by  mouth daily.  30 tablet  6   No current facility-administered medications for this visit.    Allergies:    Allergies  Allergen Reactions  . Plavix [Clopidogrel Bisulfate] Rash    Social History:  The patient  reports that she quit smoking about 9 months ago. Her smoking use included Cigarettes. She smoked 0.00 packs per day. She does not have any smokeless tobacco history on file. She reports that she drinks alcohol. She reports that she does not use illicit drugs.   Family History:  The patient's family history includes Diabetes in her paternal grandmother; Liver disease in her son.   ROS:  Please see the history of present illness.  No nausea, vomiting.  No fevers, chills.  No focal weakness.  No dysuria.   Gained weight.  Walking less.  All other systems reviewed and negative.   PHYSICAL EXAM: VS:  BP 128/93  Pulse 94  Ht 5' (1.524 m)  Wt 203 lb (92.08 kg)  BMI 39.65 kg/m2 Well nourished, well developed, in no acute distress HEENT: normal Neck: no JVD, no carotid bruits Cardiac:  normal S1, S2; RRR;  Lungs:  clear to auscultation bilaterally, no wheezing,  rhonchi or rales Abd: soft, nontender, no hepatomegaly Ext: no lower extremity edema Skin: warm and dry Neuro:   no focal abnormalities noted     ASSESSMENT AND PLAN:  1. Aortic stenosis: Status post TAVR.  Doing very well. EF has returned to normal. No heart failure symptoms.  Continue dual antiplatelet therapy for 6 months. Stop Effient since it is more than 6 months since the TAVR. Would continue aspirin.  Still not smoking. 2. BP ok today.  Have asked her to check her blood pressure at home. If her blood pressure increases, we may need to add lisinopril. She states that usually her blood pressure is well controlled.   3. Needs refills for potassium, lasix.  Continue Lasix, edema resolved with diuretic.  She occasionally skips a diuretic pill. OK to decrease to a half pill of Lasix if swelling stays under  control. 4. Obesity: She should lose 10 lbs by the end of the year.  5. If she needs an echo, she would like to get it in December due to deductible being paid off.   Signed, Fredric Mare, MD, [facility] 05/11/2014 12:01 PM

## 2014-07-18 ENCOUNTER — Other Ambulatory Visit: Payer: Self-pay | Admitting: *Deleted

## 2014-07-18 DIAGNOSIS — I350 Nonrheumatic aortic (valve) stenosis: Secondary | ICD-10-CM

## 2014-08-03 ENCOUNTER — Telehealth: Payer: Self-pay

## 2014-08-03 ENCOUNTER — Encounter (HOSPITAL_COMMUNITY): Payer: Self-pay | Admitting: Interventional Cardiology

## 2014-08-03 NOTE — Telephone Encounter (Signed)
Left message for pt to please call me back just need to know if she has had flu shot

## 2014-08-09 ENCOUNTER — Ambulatory Visit (INDEPENDENT_AMBULATORY_CARE_PROVIDER_SITE_OTHER): Payer: BC Managed Care – PPO | Admitting: Surgery

## 2014-08-09 ENCOUNTER — Ambulatory Visit (HOSPITAL_COMMUNITY)
Admission: RE | Admit: 2014-08-09 | Discharge: 2014-08-09 | Disposition: A | Discharge status: Home / Self Care | Payer: BC Managed Care – PPO | Source: Ambulatory Visit | Attending: Family Medicine | Admitting: Family Medicine

## 2014-08-09 ENCOUNTER — Encounter: Payer: Self-pay | Admitting: Surgery

## 2014-08-09 VITALS — BP 134/78 | HR 76 | Resp 20 | Ht 60.0 in | Wt 200.0 lb

## 2014-08-09 DIAGNOSIS — I509 Heart failure, unspecified: Secondary | ICD-10-CM | POA: Insufficient documentation

## 2014-08-09 DIAGNOSIS — I272 Other secondary pulmonary hypertension: Secondary | ICD-10-CM | POA: Insufficient documentation

## 2014-08-09 DIAGNOSIS — I350 Nonrheumatic aortic (valve) stenosis: Secondary | ICD-10-CM | POA: Diagnosis present

## 2014-08-09 DIAGNOSIS — I959 Hypotension, unspecified: Secondary | ICD-10-CM | POA: Insufficient documentation

## 2014-08-09 DIAGNOSIS — F17210 Nicotine dependence, cigarettes, uncomplicated: Secondary | ICD-10-CM | POA: Diagnosis not present

## 2014-08-09 DIAGNOSIS — Z954 Presence of other heart-valve replacement: Secondary | ICD-10-CM

## 2014-08-09 DIAGNOSIS — Z952 Presence of prosthetic heart valve: Secondary | ICD-10-CM | POA: Diagnosis not present

## 2014-08-09 DIAGNOSIS — I517 Cardiomegaly: Secondary | ICD-10-CM

## 2014-08-09 DIAGNOSIS — R011 Cardiac murmur, unspecified: Secondary | ICD-10-CM | POA: Insufficient documentation

## 2014-08-09 NOTE — Progress Notes (Signed)
  Echocardiogram 2D Echocardiogram has been performed.  Leta Jungling M 08/09/2014, 12:55 PM

## 2014-08-12 ENCOUNTER — Encounter: Payer: Self-pay | Admitting: Surgery

## 2014-08-12 NOTE — Progress Notes (Signed)
HPI:  The patient returns today for one year follow up after TAVR on 08/09/2013. She has felt well with resolution of her exertional shortness of breath and fatigue. She is able to be much more active. She has refrained from smoking since her surgery and feels much better.   Current Outpatient Prescriptions  Medication Sig Dispense Refill  . albuterol (PROVENTIL HFA;VENTOLIN HFA) 108 (90 BASE) MCG/ACT inhaler Inhale 2 puffs into the lungs every 6 (six) hours as needed for wheezing or shortness of breath. 1 Inhaler 3  . aspirin EC 81 MG EC tablet Take 1 tablet (81 mg total) by mouth daily.    . cyclobenzaprine (FLEXERIL) 5 MG tablet Take 5 mg by mouth 3 (three) times daily as needed. hasn't started using    . furosemide (LASIX) 40 MG tablet Take 0.5-1 tablets (20-40 mg total) by mouth daily. titrating dose as needed 30 tablet 6  . potassium chloride SA (K-DUR,KLOR-CON) 20 MEQ tablet Take 1 tablet (20 mEq total) by mouth daily. 30 tablet 6   No current facility-administered medications for this visit.     Physical Exam: BP 134/78 mmHg  Pulse 76  Resp 20  Ht 5' (1.524 m)  Wt 200 lb (90.719 kg)  BMI 39.06 kg/m2  SpO2 94% She looks well Lungs are clear Cardiac exam shows a regular rate and rhythm with normal heart sounds. There is no murmur  Diagnostic Tests:              *[facility]*         *[facility]*            [address]            [place], [place] [address]              [phone]  ------------------------------------------------------------------- Transthoracic Echocardiography  Patient:  Cathy Hailgeon, Cathy Moody MR #:    [id number] Study Date: 08/09/2014 Gender:   F Age:    [age] Height:   152.4 cm Weight:   92.1 kg BSA:    2.03 m^2 Pt. Status: Room:  ATTENDING  Lubertha BasqueLalonde, John C REFERRING  Lalonde, John C ORDERING   Tonny BollmanMichael Cooper REFERRING  Tonny BollmanMichael  Cooper PERFORMING  [facility], Outpatient SONOGRAPHER Leta Junglingiffany Cooper, RDCS  cc:  ------------------------------------------------------------------- LV EF: 65% -  70%  ------------------------------------------------------------------- Indications:   Aortic stenosis 424.1.  ------------------------------------------------------------------- History:  PMH: TAVR. Murmur and hypotension. Congestive heart failure. Aortic valve disease. Primary pulmonary hypertension. Risk factors: Current tobacco use.  ------------------------------------------------------------------- Study Conclusions  - Left ventricle: The cavity size was normal. There was mild concentric hypertrophy. Systolic function was vigorous. The estimated ejection fraction was in the range of 65% to 70%. Wall motion was normal; there were no regional wall motion abnormalities. Doppler parameters are consistent with abnormal left ventricular relaxation (grade 1 diastolic dysfunction). - Aortic valve: TAVR - well seated, no regurgitation or perivalvular leak. Normal transaortic velocities. Mean gradient (S): 13 mm Hg. Valve area (VTI): 1.91 cm^2. Valve area (Vmax): 1.71 cm^2. Valve area (Vmean): 1.98 cm^2. - Pulmonary arteries: Systolic pressure was mildly increased. PA peak pressure: 33 mm Hg (S).  Transthoracic echocardiography. M-mode, complete 2D, spectral Doppler, and color Doppler. Birthdate: Patient birthdate: 1951/01/04. Age: Patient is 63 yr old. Sex: Gender: female. BMI: 39.6 kg/m^2. Blood pressure:   128/93 Patient status: Inpatient. Study date: Study date: 08/09/2014. Study time: 11:59 AM. Location: Echo laboratory.  -------------------------------------------------------------------  ------------------------------------------------------------------- Left ventricle: The cavity size  was normal. There was mild concentric hypertrophy. Systolic function was vigorous.  The estimated ejection fraction was in the range of 65% to 70%. Wall motion was normal; there were no regional wall motion abnormalities. Doppler parameters are consistent with abnormal left ventricular relaxation (grade 1 diastolic dysfunction).  ------------------------------------------------------------------- Aortic valve: TAVR - well seated, no regurgitation or perivalvular leak. Normal transaortic velocities. Doppler:   VTI ratio of LVOT to aortic valve: 0.61. Valve area (VTI): 1.91 cm^2. Indexed valve area (VTI): 0.94 cm^2/m^2. Valve area (Vmax): 1.71 cm^2. Indexed valve area (Vmax): 0.84 cm^2/m^2. Mean velocity ratio of LVOT to aortic valve: 0.63. Valve area (Vmean): 1.98 cm^2. Indexed valve area (Vmean): 0.98 cm^2/m^2.  Mean gradient (S): 13 mm Hg. Peak gradient (S): 31 mm Hg.  ------------------------------------------------------------------- Aorta: Aortic root: The aortic root was normal in size.  ------------------------------------------------------------------- Mitral valve:  Structurally normal valve.  Mobility was not restricted. Doppler: Transvalvular velocity was within the normal range. There was no evidence for stenosis. There was no regurgitation.  Peak gradient (D): 2 mm Hg.  ------------------------------------------------------------------- Left atrium: The atrium was at the upper limits of normal in size.  ------------------------------------------------------------------- Right ventricle: The cavity size was normal. Wall thickness was normal. Systolic function was normal.  ------------------------------------------------------------------- Pulmonic valve:  Poorly visualized. Structurally normal valve. Cusp separation was normal. Doppler: Transvalvular velocity was within the normal range. There was no evidence for stenosis. There was no regurgitation.  ------------------------------------------------------------------- Tricuspid  valve:  Structurally normal valve.  Doppler: Transvalvular velocity was within the normal range. There was trivial regurgitation.  ------------------------------------------------------------------- Pulmonary artery:  The main pulmonary artery was normal-sized. Systolic pressure was mildly increased.  ------------------------------------------------------------------- Right atrium: The atrium was normal in size.  ------------------------------------------------------------------- Pericardium: There was no pericardial effusion.  ------------------------------------------------------------------- Systemic veins: Inferior vena cava: The vessel was normal in size.  ------------------------------------------------------------------- Measurements  Left ventricle              Value     Reference LV ID, ED, PLAX chordal      (L)   32  mm    43 - 52 LV ID, ES, PLAX chordal          24.9 mm    23 - 38 LV fx shortening, PLAX chordal  (L)   22  %    >=29 LV PW thickness, ED            12  mm    --------- IVS/LV PW ratio, ED            1.17      <=1.3 Stroke volume, 2D             79  ml    --------- Stroke volume/bsa, 2D           39  ml/m^2  --------- LV e&', lateral              8.27 cm/s   --------- LV E/e&', lateral             8.85      --------- LV e&', medial               5.44 cm/s   --------- LV E/e&', medial              13.46     --------- LV e&', average              6.86 cm/s   --------- LV E/e&', average  10.68     ---------  Ventricular septum            Value     Reference IVS thickness, ED             14  mm    ---------  LVOT                   Value      Reference LVOT ID, S                20  mm    --------- LVOT area                 3.14 cm^2   --------- LVOT peak velocity, S           151  cm/s   --------- LVOT mean velocity, S           104  cm/s   --------- LVOT VTI, S                25  cm    --------- LVOT peak gradient, S           9   mm Hg  ---------  Aortic valve               Value     Reference Aortic valve mean velocity, S       165  cm/s   --------- Aortic valve VTI, S            41.2 cm    --------- Aortic mean gradient, S          13  mm Hg  --------- Aortic peak gradient, S          31  mm Hg  --------- VTI ratio, LVOT/AV            0.61      --------- Aortic valve area, VTI          1.91 cm^2   --------- Aortic valve area/bsa, VTI        0.94 cm^2/m^2 --------- Aortic valve area, peak velocity     1.71 cm^2   --------- Aortic valve area/bsa, peak        0.84 cm^2/m^2 --------- velocity Velocity ratio, mean, LVOT/AV       0.63      --------- Aortic valve area, mean velocity     1.98 cm^2   --------- Aortic valve area/bsa, mean        0.98 cm^2/m^2 --------- velocity  Aorta                   Value     Reference Aortic root ID, ED            25  mm    ---------  Left atrium                Value     Reference LA ID, A-P, ES              33  mm    --------- LA ID/bsa, A-P              1.63 cm/m^2  <=2.2 LA volume, S               66.6 ml    --------- LA volume/bsa, S             32.9 ml/m^2  --------- LA volume, ES, 1-p A4C  62.3 ml    --------- LA volume/bsa, ES, 1-p A4C        30.7  ml/m^2  --------- LA volume, ES, 1-p A2C          70.7 ml    --------- LA volume/bsa, ES, 1-p A2C        34.9 ml/m^2  ---------  Mitral valve               Value     Reference Mitral E-wave peak velocity        73.2 cm/s   --------- Mitral A-wave peak velocity        105  cm/s   --------- Mitral deceleration time         197  ms    150 - 230 Mitral peak gradient, D          2   mm Hg  --------- Mitral E/A ratio, peak          0.7      ---------  Pulmonary arteries            Value     Reference PA pressure, S, DP        (H)   33  mm Hg  <=30  Tricuspid valve              Value     Reference Tricuspid regurg peak velocity      273  cm/s   --------- Tricuspid peak RV-RA gradient       30  mm Hg  ---------  Systemic veins              Value     Reference Estimated CVP               3   mm Hg  ---------  Right ventricle              Value     Reference RV pressure, S, DP        (H)   33  mm Hg  <=30 RV s&', lateral, S             10  cm/s   ---------  Legend: (L) and (H) mark values outside specified reference range.  ------------------------------------------------------------------- Prepared and Electronically Authenticated by  Donato Schultz, M.D. 2015-12-16T17:02:14  Impression:  She is doing well one year after TAVR. Her echo looks good. I encouraged her to continue her non-smoking lifestyle and to remain physically active.  Plan:  She will continue to follow up with Dr. Eldridge Dace for her cardiology care.

## 2014-10-02 ENCOUNTER — Other Ambulatory Visit: Payer: Self-pay | Admitting: *Deleted

## 2014-10-02 MED ORDER — FUROSEMIDE 40 MG PO TABS: 20.0000 mg | ORAL_TABLET | Freq: Every day | ORAL | Status: DC

## 2014-10-02 MED ORDER — POTASSIUM CHLORIDE CRYS ER 20 MEQ PO TBCR: 20.0000 meq | EXTENDED_RELEASE_TABLET | Freq: Every day | ORAL | Status: DC

## 2014-11-06 ENCOUNTER — Telehealth: Payer: Self-pay | Admitting: Internal Medicine

## 2014-11-06 NOTE — Telephone Encounter (Signed)
Pt got a new insurance card at the beginning of the year and her insurance requires a referral now. Pt has a follow-up appt on this Wednesday March 16th with Dr. Lance Muss. Dx code is Z95.4   [employer] ID- [id number] Group #[id number] Dr. Lance Muss Dx Code- Z95.4 Phone # to Doctor-  [phone]    Pt is coming in Thursday for a med check but needs her ventolin filled to [facility].

## 2014-11-06 NOTE — Telephone Encounter (Signed)
She said everything should be changed and if any questions, you can call her on her cell at [phone]. And needs a refill on her ventolin filled to [facility]

## 2014-11-06 NOTE — Telephone Encounter (Signed)
Pt informed she needs to call [facility] and get Dr.Lalonde as pcp on card so I can make referral pt verbalized understanding and said she was doing this

## 2014-11-06 NOTE — Telephone Encounter (Signed)
Take care of this 

## 2014-11-06 NOTE — Telephone Encounter (Signed)
Referral has been made.

## 2014-11-08 ENCOUNTER — Encounter: Payer: Self-pay | Admitting: Interventional Cardiology

## 2014-11-08 ENCOUNTER — Ambulatory Visit (INDEPENDENT_AMBULATORY_CARE_PROVIDER_SITE_OTHER): Payer: 59 | Admitting: Interventional Cardiology

## 2014-11-08 VITALS — BP 120/90 | HR 93 | Ht 60.0 in | Wt 207.0 lb

## 2014-11-08 DIAGNOSIS — Z954 Presence of other heart-valve replacement: Secondary | ICD-10-CM | POA: Diagnosis not present

## 2014-11-08 DIAGNOSIS — Z952 Presence of prosthetic heart valve: Secondary | ICD-10-CM

## 2014-11-08 MED ORDER — POTASSIUM CHLORIDE CRYS ER 20 MEQ PO TBCR: 20.0000 meq | EXTENDED_RELEASE_TABLET | Freq: Every day | ORAL | Status: DC

## 2014-11-08 MED ORDER — FUROSEMIDE 40 MG PO TABS: 20.0000 mg | ORAL_TABLET | Freq: Every day | ORAL | Status: DC

## 2014-11-08 NOTE — Progress Notes (Signed)
Patient ID: Cathy Moody, female   DOB: 03-27-1951, 64 y.o.   MRN: [id number]    [address] [place], [place]  [address] Phone: [phone] Fax:  [phone]  Date:  11/08/2014   ID:  Cathy Moody, DOB 06-28-1951, MRN [id number]  PCP:  Carollee Herter, MD      History of Present Illness: Cathy Moody is a 64 y.o. female who presented in 12/14 with CHF and low EF.  She was found to have severe AS and acute systolic heart failure.  She was diuresed and then had TAVR.  Her LV function has improved since 1/15.  It is now 55-60%.  She is feeling well.  Her only complaint is having urinate a lot after the dose of the Lasix every morning. She is titrating the dose as she feels fluid retention or based on what she has to do that day. She does not notice fluid retention if she holds the Lasix.   Overall, she feels quite well. She denies any chest discomfort or shortness of breath with walking. She has had a recent URI/allergies. She is using albuterol freqently.  She sleeps well. She is able to lie flat without any problems. She is taking antibiotics when she goes to the dentist.    Wants to start walking soon.  Allergies bother her this time of the year.   Wt Readings from Last 3 Encounters:  11/08/14 207 lb (93.895 kg)  08/09/14 200 lb (90.719 kg)  05/11/14 203 lb (92.08 kg)     Past Medical History  Diagnosis Date  . Asthma   . Heart murmur   . Acute CHF     [name] 07/29/2013  . Alcohol abuse     /notes 07/29/2013  . Hypotension     Current Outpatient Prescriptions  Medication Sig Dispense Refill  . albuterol (PROVENTIL HFA;VENTOLIN HFA) 108 (90 BASE) MCG/ACT inhaler Inhale 2 puffs into the lungs every 6 (six) hours as needed for wheezing or shortness of breath. 1 Inhaler 3  . aspirin EC 81 MG EC tablet Take 1 tablet (81 mg total) by mouth daily.    . cyclobenzaprine (FLEXERIL) 5 MG tablet Take 5 mg by mouth 3 (three) times daily as needed. hasn't started using      . furosemide (LASIX) 40 MG tablet Take 0.5-1 tablets (20-40 mg total) by mouth daily. titrating dose as needed 30 tablet 1  . potassium chloride SA (K-DUR,KLOR-CON) 20 MEQ tablet Take 1 tablet (20 mEq total) by mouth daily. 30 tablet 0   No current facility-administered medications for this visit.    Allergies:    Allergies  Allergen Reactions  . Plavix [Clopidogrel Bisulfate] Rash    Social History:  The patient  reports that she quit smoking about 15 months ago. Her smoking use included Cigarettes. She does not have any smokeless tobacco history on file. She reports that she drinks alcohol. She reports that she does not use illicit drugs.   Family History:  The patient's family history includes Diabetes in her paternal grandmother; Liver disease in her son.   ROS:  Please see the history of present illness.  No nausea, vomiting.  No fevers, chills.  No focal weakness.  No dysuria.   Gained weight.  Walking less.  All other systems reviewed and negative.   PHYSICAL EXAM: VS:  BP 120/90 mmHg  Pulse 93  Ht 5' (1.524 m)  Wt 207 lb (93.895 kg)  BMI 40.43 kg/m2  Well nourished, well developed, in no acute distress HEENT: normal Neck: no JVD, no carotid bruits Cardiac:  normal S1, S2; RRR;  Lungs:  clear to auscultation bilaterally, no wheezing, rhonchi or rales Abd: soft, nontender, no hepatomegaly Ext: no lower extremity edema Skin: warm and dry Neuro:   no focal abnormalities noted Psych: normal affect     ASSESSMENT AND PLAN:  1. Aortic stenosis: Status post TAVR.  Doing very well. EF has returned to normal. No heart failure symptoms.  Continue aspirin.  Still not smoking. 2. BP ok today.  Have asked her to check her blood pressure at home. If her blood pressure increases, we may need to add lisinopril. She states that usually her blood pressure is well controlled.  She checks about twice a month.  Diastolic usually 85 mm Hg. 3. refill potassium, lasix.  She occasionally skips  a diuretic pill. OK to decrease to a half pill of Lasix if swelling stays under control.  She can take it 3 times a week and see if there is any fluid retention.  If not, ok to continue to wean Lasix.  4. Obesity: She had a target  10 lbs weight loss by the end of 2015. She gained weight.  Spoke about the importance of weight loss.    Signed, Fredric Mare, MD, [facility] 11/08/2014 12:42 PM

## 2014-11-08 NOTE — Patient Instructions (Signed)
Your Furosemide (Lasix) and Potassium have been refilled.  Your physician wants you to follow-up in: 1 year with Dr. Eldridge Dace.  You will receive a reminder letter in the mail two months in advance. If you don't receive a letter, please call our office to schedule the follow-up appointment.

## 2014-11-09 ENCOUNTER — Ambulatory Visit (INDEPENDENT_AMBULATORY_CARE_PROVIDER_SITE_OTHER): Payer: 59 | Admitting: Family Medicine

## 2014-11-09 ENCOUNTER — Other Ambulatory Visit: Payer: Self-pay | Admitting: Family Medicine

## 2014-11-09 ENCOUNTER — Encounter: Payer: Self-pay | Admitting: Family Medicine

## 2014-11-09 ENCOUNTER — Other Ambulatory Visit: Payer: Self-pay

## 2014-11-09 VITALS — BP 126/74 | HR 64 | Ht 60.5 in | Wt 207.0 lb

## 2014-11-09 DIAGNOSIS — E669 Obesity, unspecified: Secondary | ICD-10-CM | POA: Diagnosis not present

## 2014-11-09 DIAGNOSIS — J301 Allergic rhinitis due to pollen: Secondary | ICD-10-CM | POA: Diagnosis not present

## 2014-11-09 DIAGNOSIS — J449 Chronic obstructive pulmonary disease, unspecified: Secondary | ICD-10-CM

## 2014-11-09 DIAGNOSIS — F17201 Nicotine dependence, unspecified, in remission: Secondary | ICD-10-CM | POA: Diagnosis not present

## 2014-11-09 DIAGNOSIS — J4520 Mild intermittent asthma, uncomplicated: Secondary | ICD-10-CM

## 2014-11-09 DIAGNOSIS — Z952 Presence of prosthetic heart valve: Secondary | ICD-10-CM

## 2014-11-09 DIAGNOSIS — Z954 Presence of other heart-valve replacement: Secondary | ICD-10-CM | POA: Diagnosis not present

## 2014-11-09 DIAGNOSIS — R946 Abnormal results of thyroid function studies: Secondary | ICD-10-CM

## 2014-11-09 DIAGNOSIS — R7989 Other specified abnormal findings of blood chemistry: Secondary | ICD-10-CM

## 2014-11-09 LAB — CBC WITH DIFFERENTIAL/PLATELET
BASOS ABS: 0 10*3/uL (ref 0.0–0.1)
BASOS PCT: 0 % (ref 0–1)
EOS PCT: 5 % (ref 0–5)
Eosinophils Absolute: 0.4 10*3/uL (ref 0.0–0.7)
HCT: 41.6 % (ref 36.0–46.0)
Hemoglobin: 13.6 g/dL (ref 12.0–15.0)
LYMPHS ABS: 1.9 10*3/uL (ref 0.7–4.0)
LYMPHS PCT: 24 % (ref 12–46)
MCH: 32.1 pg (ref 26.0–34.0)
MCHC: 32.7 g/dL (ref 30.0–36.0)
MCV: 98.1 fL (ref 78.0–100.0)
MONOS PCT: 11 % (ref 3–12)
MPV: 9.2 fL (ref 8.6–12.4)
Monocytes Absolute: 0.9 10*3/uL (ref 0.1–1.0)
NEUTROS PCT: 60 % (ref 43–77)
Neutro Abs: 4.7 10*3/uL (ref 1.7–7.7)
Platelets: 391 10*3/uL (ref 150–400)
RBC: 4.24 MIL/uL (ref 3.87–5.11)
RDW: 12.7 % (ref 11.5–15.5)
WBC: 7.9 10*3/uL (ref 4.0–10.5)

## 2014-11-09 LAB — COMPREHENSIVE METABOLIC PANEL
ALBUMIN: 4.5 g/dL (ref 3.5–5.2)
ALT: 17 U/L (ref 0–35)
AST: 20 U/L (ref 0–37)
Alkaline Phosphatase: 75 U/L (ref 39–117)
BILIRUBIN TOTAL: 0.5 mg/dL (ref 0.2–1.2)
BUN: 13 mg/dL (ref 6–23)
CALCIUM: 9.5 mg/dL (ref 8.4–10.5)
CO2: 30 meq/L (ref 19–32)
CREATININE: 0.81 mg/dL (ref 0.50–1.10)
Chloride: 99 mEq/L (ref 96–112)
GLUCOSE: 127 mg/dL — AB (ref 70–99)
POTASSIUM: 4.7 meq/L (ref 3.5–5.3)
Sodium: 140 mEq/L (ref 135–145)
Total Protein: 7.4 g/dL (ref 6.0–8.3)

## 2014-11-09 LAB — LIPID PANEL
Cholesterol: 238 mg/dL — ABNORMAL HIGH (ref 0–200)
HDL: 82 mg/dL (ref >=46)
LDL CALC: 137 mg/dL — AB (ref 0–99)
Total CHOL/HDL Ratio: 2.9 Ratio
Triglycerides: 93 mg/dL (ref <150)
VLDL: 19 mg/dL (ref 0–40)

## 2014-11-09 LAB — TSH: TSH: 7.313 u[IU]/mL — AB (ref 0.350–4.500)

## 2014-11-09 MED ORDER — ALBUTEROL SULFATE HFA 108 (90 BASE) MCG/ACT IN AERS: 2.0000 | INHALATION_SPRAY | Freq: Four times a day (QID) | RESPIRATORY_TRACT | Status: DC | PRN

## 2014-11-09 MED ORDER — MOMETASONE FUROATE 220 MCG/INH IN AEPB: 2.0000 | INHALATION_SPRAY | Freq: Every day | RESPIRATORY_TRACT | Status: DC

## 2014-11-09 NOTE — Progress Notes (Signed)
   Subjective:    Patient ID: Cathy Moody, female    DOB: July 26, 1951, 64 y.o.   MRN: [id number]  HPI She is here for an initial evaluation. She has not been seen her since she was hospitalized for her acute heart failure. She has been followed by cardiovascular. She has had an aortic valve replacement. She was recently seen by her cardiologist. Presently she is taking Lasix. She also has spring and fall asthma symptoms but no allergy symptoms consisting of sneezing, itchy watery eyes, rhinorrhea, nasal congestion. She is using albuterol twice per day. There is a history of COPD.He is on nighttime oxygen. She did smoke but has not smoked since her admission.There is a history of elevated TSH but no follow-up. Her exercise is quite minimal and she has gained a lot of weight. She has not had a general medical check in quite some time.   Review of Systems     Objective:   Physical Exam Alert and in no distress. Tympanic membranes and canals are normal. Pharyngeal area is normal. Neck is supple without adenopathy or thyromegaly. Cardiac exam shows a regular sinus rhythm without murmurs or gallops. Lungs are clear to auscultation.        Assessment & Plan:  Tobacco abuse, in remission  Obesity - Plan: CBC with Differential/Platelet, Comprehensive metabolic panel, Lipid panel  Asthma, mild intermittent, uncomplicated - Plan: albuterol (PROVENTIL HFA;VENTOLIN HFA) 108 (90 BASE) MCG/ACT inhaler, mometasone (ASMANEX 60 METERED DOSES) 220 MCG/INH inhaler  S/P TAVR (transcatheter aortic valve replacement) - Plan: CBC with Differential/Platelet, Comprehensive metabolic panel, Lipid panel  Elevated TSH - Plan: TSH  COPD, severe  Allergic rhinitis due to pollen She is to return for complete examination. She will need general health maintenance. I will also discuss diet and exercise with her. She will densely need to have pulmonary function testing. Essentially she needs to get back into the medical  system entirely to get all of her general medical needs cared for. She is comfortable with that approach.

## 2014-11-13 LAB — THYROID PEROXIDASE ANTIBODY: Thyroperoxidase Ab SerPl-aCnc: 85 IU/mL — ABNORMAL HIGH (ref <9)

## 2014-11-14 ENCOUNTER — Ambulatory Visit (INDEPENDENT_AMBULATORY_CARE_PROVIDER_SITE_OTHER): Payer: 59 | Admitting: Family Medicine

## 2014-11-14 DIAGNOSIS — E039 Hypothyroidism, unspecified: Secondary | ICD-10-CM

## 2014-11-14 DIAGNOSIS — R739 Hyperglycemia, unspecified: Secondary | ICD-10-CM

## 2014-11-14 DIAGNOSIS — R7309 Other abnormal glucose: Secondary | ICD-10-CM

## 2014-11-14 DIAGNOSIS — E669 Obesity, unspecified: Secondary | ICD-10-CM

## 2014-11-14 DIAGNOSIS — J4520 Mild intermittent asthma, uncomplicated: Secondary | ICD-10-CM | POA: Diagnosis not present

## 2014-11-14 LAB — POCT GLYCOSYLATED HEMOGLOBIN (HGB A1C): Hemoglobin A1C: 5.7

## 2014-11-14 MED ORDER — LEVOTHYROXINE SODIUM 100 MCG PO TABS: 100.0000 ug | ORAL_TABLET | Freq: Every day | ORAL | Status: DC

## 2014-11-14 NOTE — Progress Notes (Signed)
   Subjective:    Patient ID: Cathy Moody, female    DOB: 11/29/1950, 64 y.o.   MRN: [id number]  HPI She is here for recheck. She did have recent blood work which did show elevated TSH as well as TPO. Presently she is having no skin or hair changes, cold intolerance, constipation. She continues on her asthma medicine and states that it has helped. Also she had a recent blood sugar which was elevated however the hemoglobin A1c was I 0.7.   Review of Systems     Objective:   Physical Exam Alert and in no distress. Exam of her skin shows no changes. DTRs are normal.       Assessment & Plan:  Obesity  Asthma, mild intermittent, uncomplicated  Hypothyroidism, unspecified hypothyroidism type - Plan: levothyroxine (SYNTHROID, LEVOTHROID) 100 MCG tablet  Elevated blood sugar - Plan: POCT glycosylated hemoglobin (Hb A1C) Discuss permanent lifestyle change in regard to her risk for diabetes. Encouraged her to do 20 minutes a day of something physical as well as cutting back on carbohydrates. Also instructed her on proper use of the thyroid medication taking it on an empty stomach. Encouraged her to continue with her asthma medication and discussed the use of the rescue inhaler. Check here 2 months.

## 2014-11-14 NOTE — Patient Instructions (Addendum)
150 minutes a week of something physical. Reduce your carbohydrate intake and the easiest way to remember that is white food. Cut back on white food by one half  Goal should be permanent lifestyle change

## 2015-02-14 ENCOUNTER — Other Ambulatory Visit: Payer: Self-pay | Admitting: Family Medicine

## 2015-02-14 NOTE — Telephone Encounter (Signed)
Is this okay to refill? 

## 2015-02-20 NOTE — Addendum Note (Signed)
Addended by: Elmarie Shiley on: 02/20/2015 06:15 PM   Modules accepted: Orders

## 2015-02-24 IMAGING — CR DG CHEST 1V PORT
1 series · 1 of 1 positions shown · non-contrast
Comparison: None.

CLINICAL DATA: Shortness of breath.  Tachycardia.

EXAM:
PORTABLE CHEST - 1 VIEW

[AP]
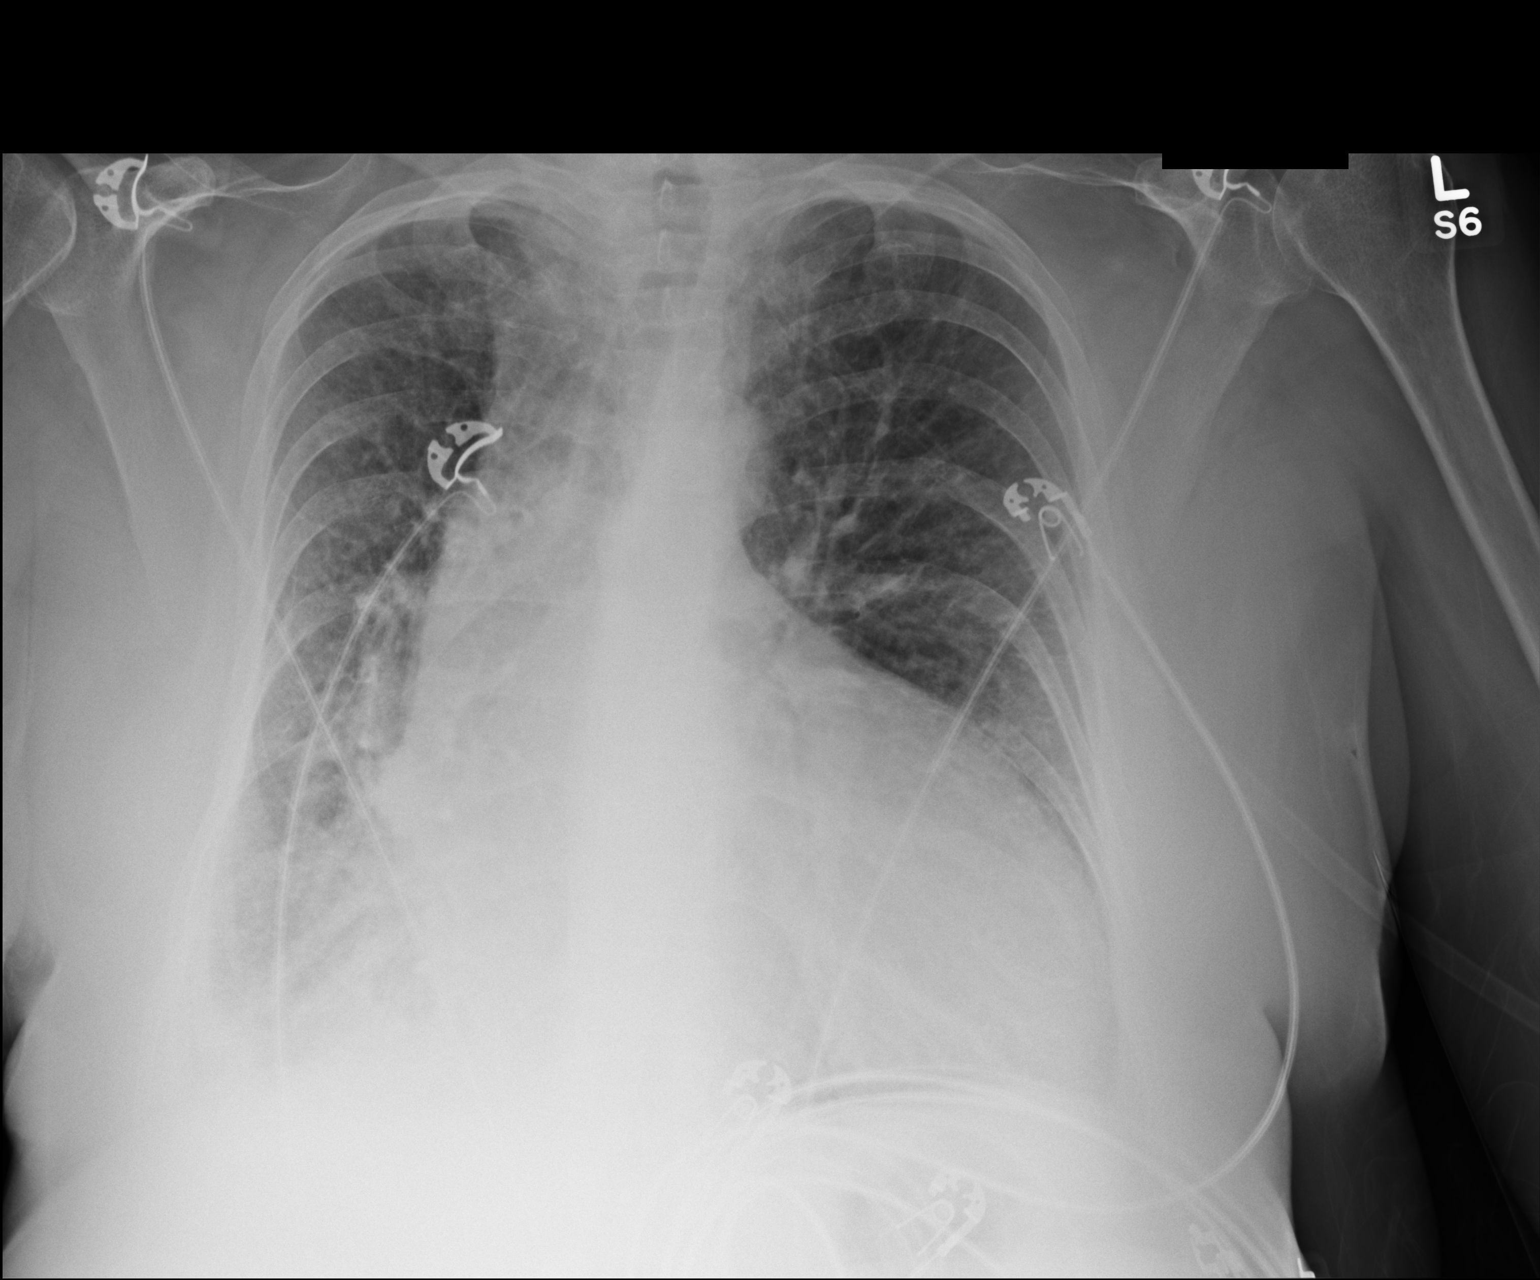

[1 of 1 positions shown; findings below may reference images not displayed]

FINDINGS: Severe cardiomegaly is present. Cardiac echo should be considered as
the possibility of pericardial effusion, cardiomyopathy, and/or
valvular disease should be considered in this patient. Mild
pulmonary vascular prominence noted. Bilateral interstitial
prominence with Kerley B-lines and bilateral alveolar prominence
noted consistent with pulmonary edema. Right-sided pleural effusion
is most likely present. No pneumothorax. No acute bony abnormality.
IMPRESSION: Findings consistent with congestive heart failure with pulmonary
edema and right-sided pleural effusion. Cardiomegaly is severe.
Cardiac ECHO may prove useful for further evaluation as the
possibility of pericardial effusion, cardiomyopathy, and/or valvular
disease should be considered in this patient.

## 2015-03-05 ENCOUNTER — Other Ambulatory Visit: Payer: Self-pay

## 2015-03-05 ENCOUNTER — Telehealth: Payer: Self-pay | Admitting: Family Medicine

## 2015-03-05 MED ORDER — ALPRAZOLAM 0.25 MG PO TABS: 0.2500 mg | ORAL_TABLET | Freq: Two times a day (BID) | ORAL | Status: DC | PRN

## 2015-03-05 NOTE — Telephone Encounter (Signed)
Called in per jcl 

## 2015-03-05 NOTE — Telephone Encounter (Signed)
Seri called they are taking Cathy Moody off life support tomorrow and she wanted to know if you could give her Rx to help her out the next week or so  something to take the edge off. She also wanted to tell you Thank you for all you did for Cathy Moody and tried to do     [facility] [address]

## 2015-03-05 NOTE — Telephone Encounter (Signed)
Called xanax in per jcl 

## 2015-03-05 NOTE — Telephone Encounter (Signed)
Call in Xanax 0.25 mg #20. 1 twice a day when necessary for anxiety

## 2015-04-04 IMAGING — CR DG CHEST 2V
2 series · 2 of 2 positions shown · non-contrast
Comparison: 09/02/2013; 08/31/2013; ultrasound-guided left-sided
thoracentesis -09/02/2013

CLINICAL DATA: Evaluate left-sided pleural effusion

EXAM:
CHEST  2 VIEW

[w chest pa]
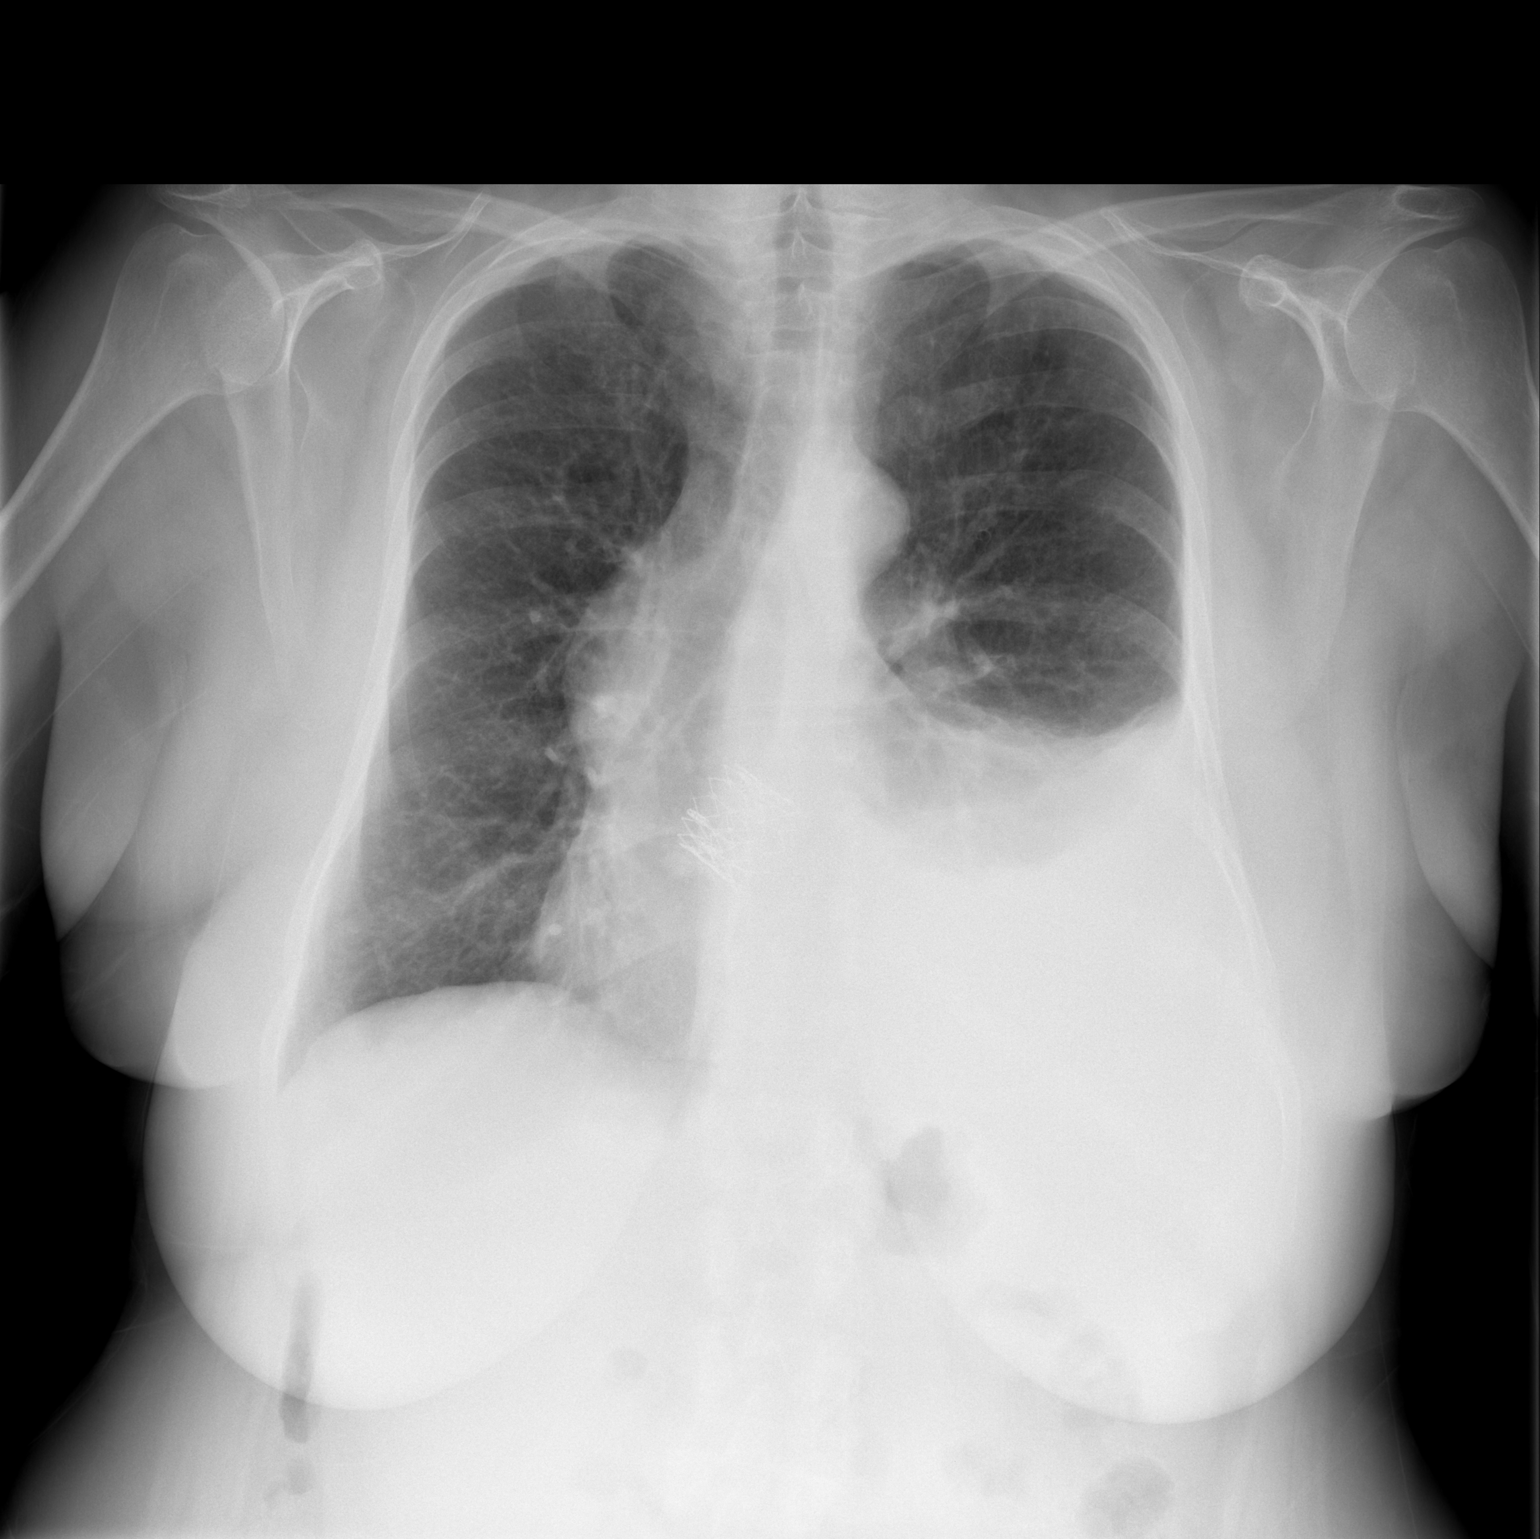

[w chest lat]
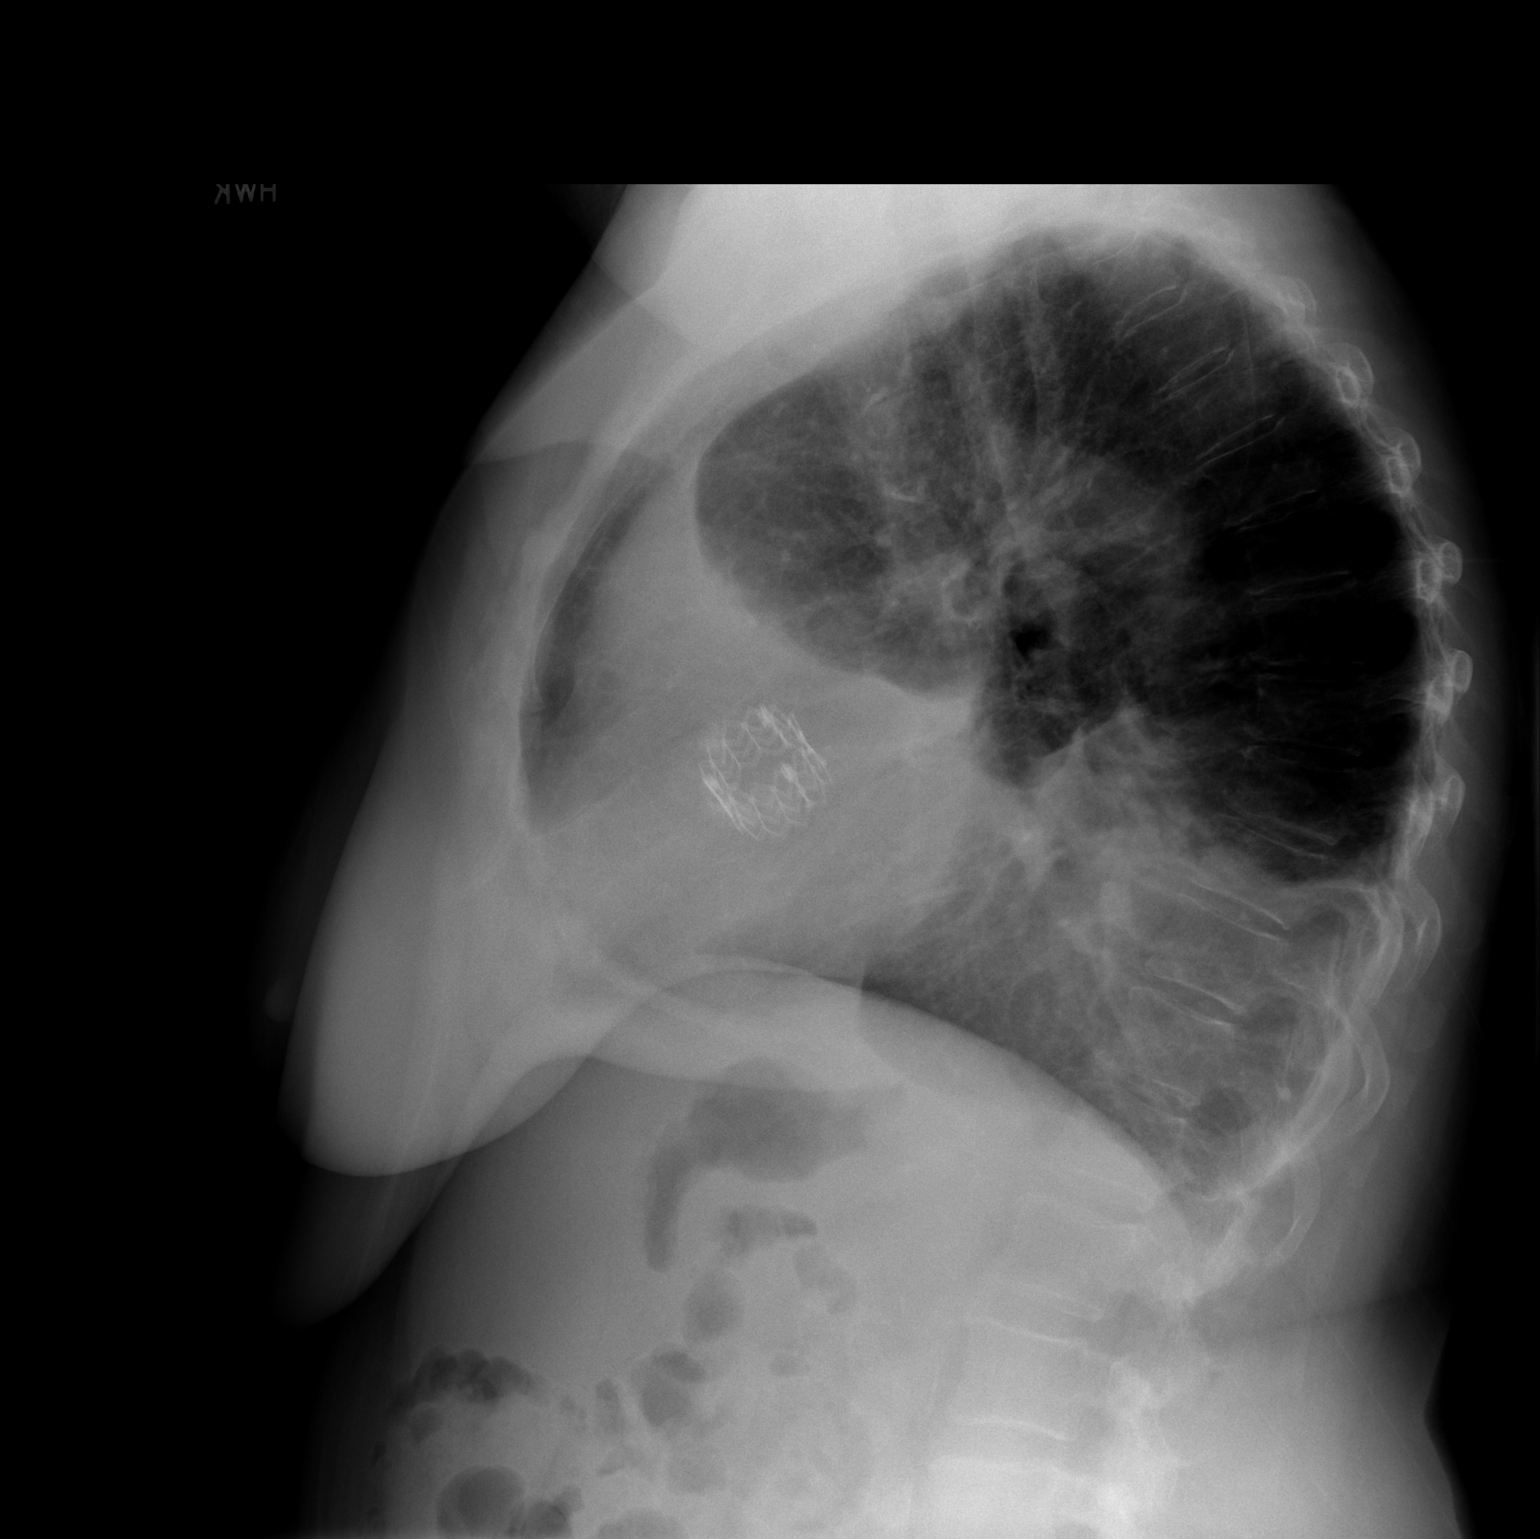

[2 of 2 positions shown; findings below may reference images not displayed]

FINDINGS: Grossly unchanged cardiac silhouette and mediastinal contours with
tortuosity and possible ectasia of the thoracic aorta. Post
percutaneous aortic valve replacement. Interval increase in now
moderate sized left-sided effusion with associated worsening left
mid and lower lung heterogeneous/consolidative opacities. No
definite right-sided pleural effusion. No evidence of edema. No
pneumothorax. Unchanged bones.
IMPRESSION: Interval increase and now moderate size left-sided effusion with
associated worsening left basilar atelectasis.

## 2015-04-05 IMAGING — US US THORACENTESIS ASP PLEURAL SPACE W/IMG GUIDE
1 series · 4 of 4 positions shown · non-contrast
Comparison: Previous thoracentesis

CLINICAL DATA: History recent TAVR, recurrent left-sided pleural
effusion. Request therapeutic thoracentesis.

EXAM:
ULTRASOUND GUIDED left THORACENTESIS

[Series 1: us thoracentesis asp pleural space w/img guide · 0.27mm/px · 4 of 4 slices shown]
[im 1/4]
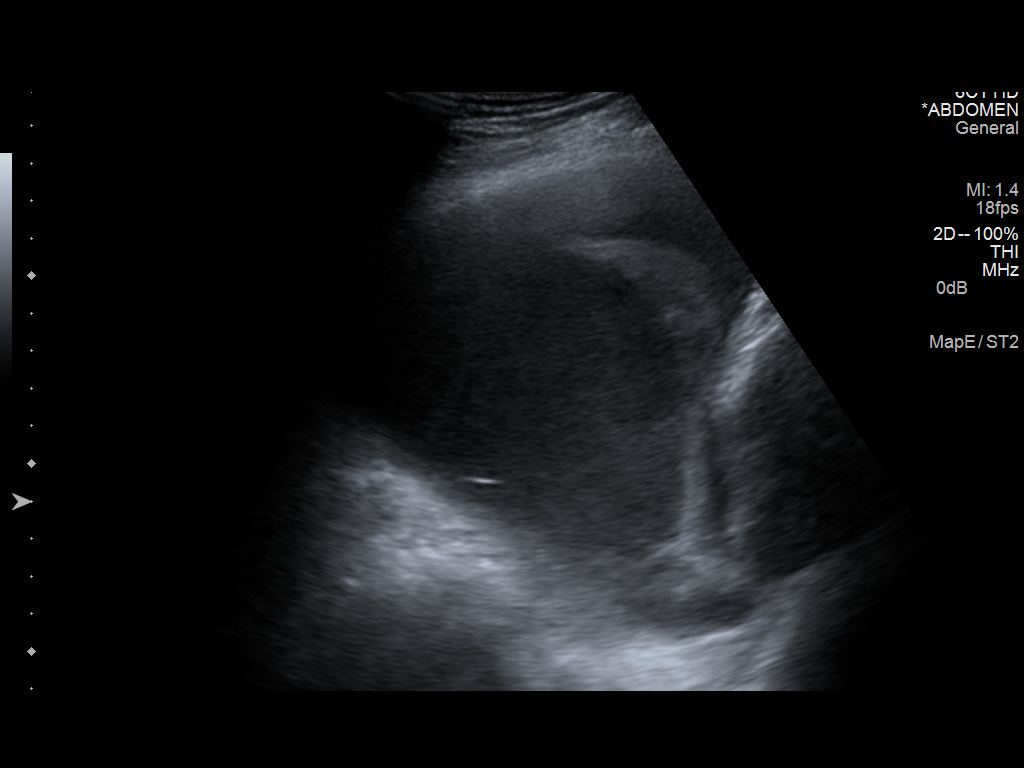
[im 2/4]
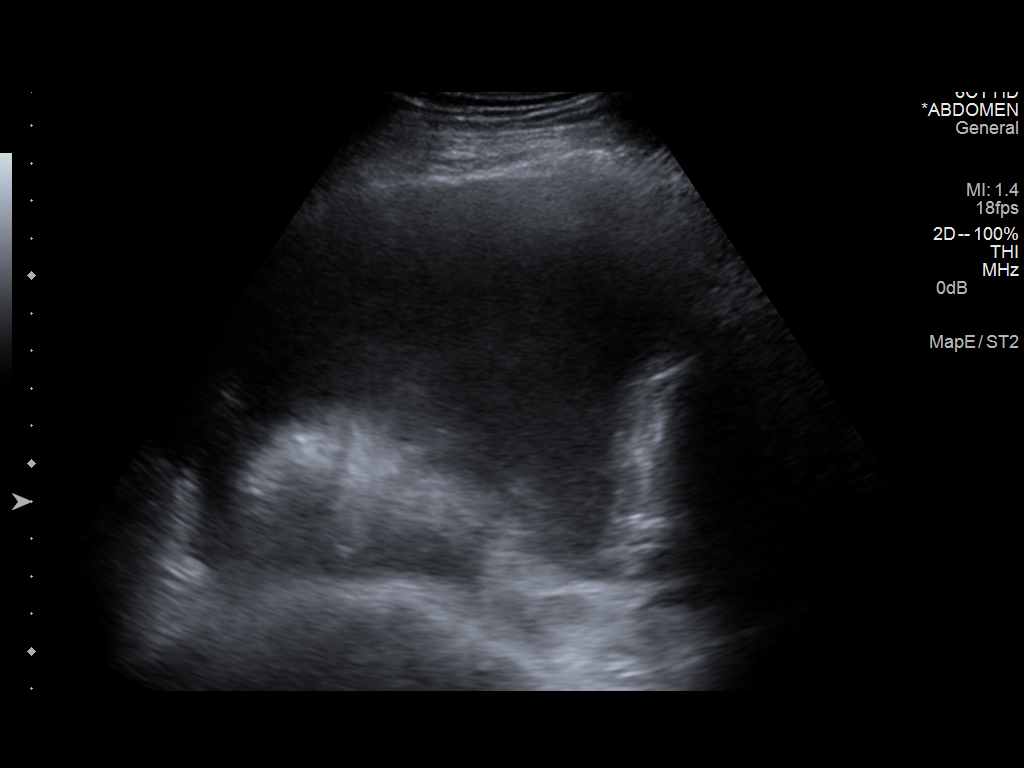
[im 3/4]
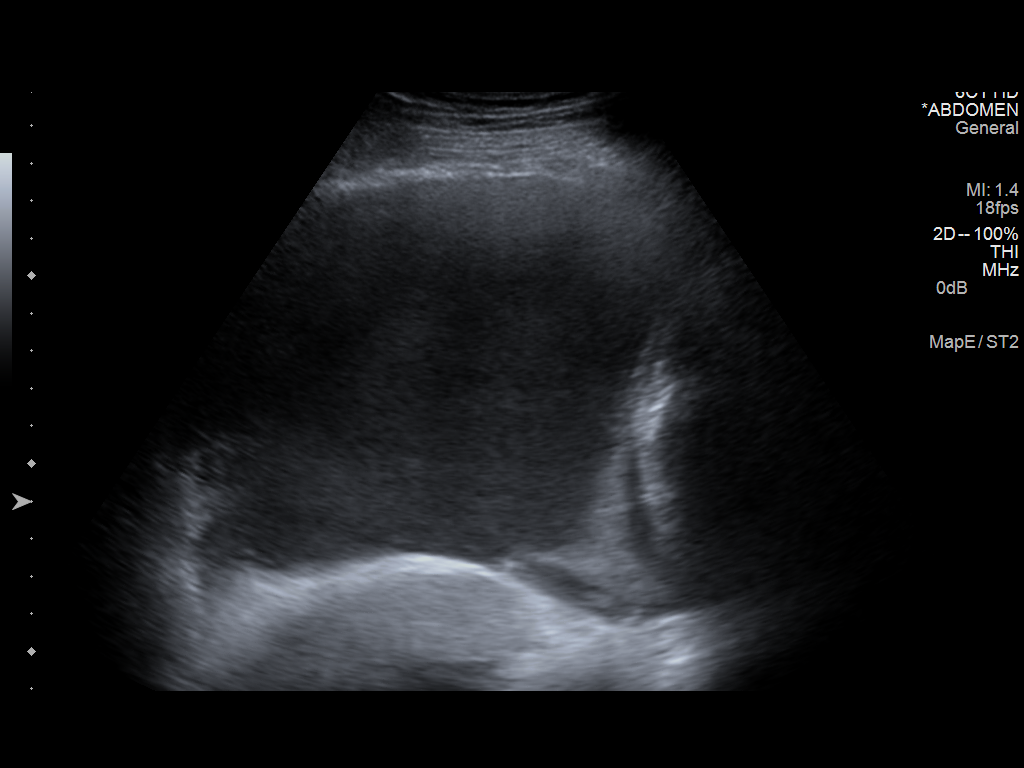
[im 4/4]
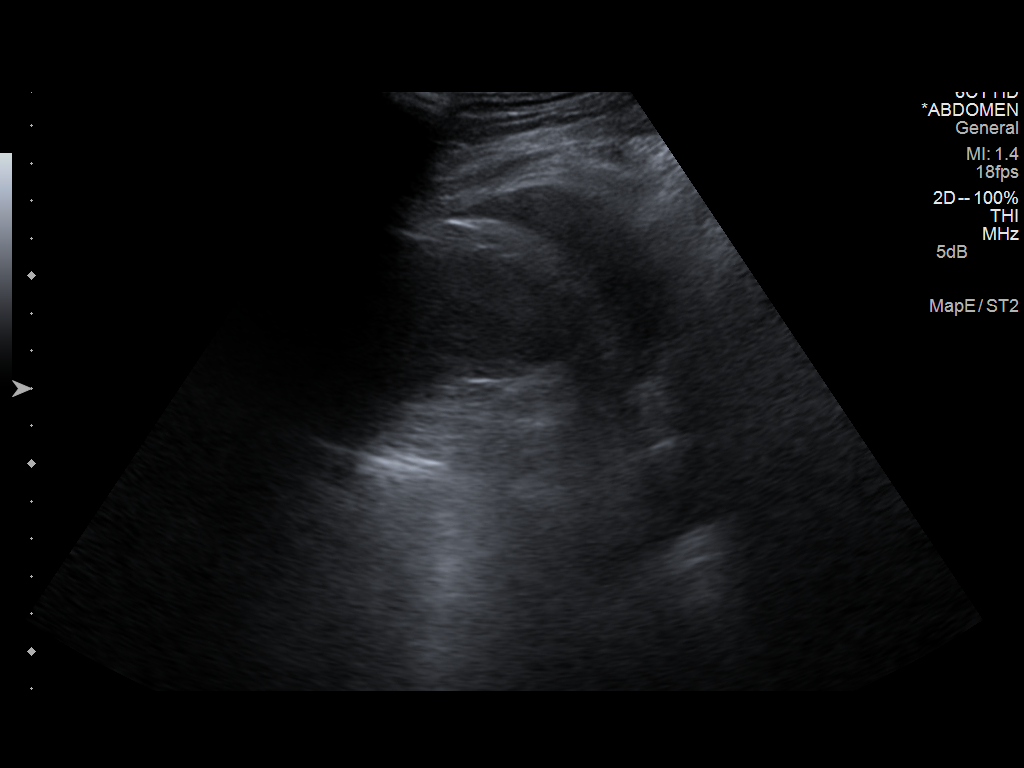

[4 of 4 positions shown; findings below may reference images not displayed]

FINDINGS: A total of approximately 1.2 L of dark, bloody pleural fluid was
removed. A fluid sample was notsent for laboratory analysis.
IMPRESSION: Successful ultrasound guided left thoracentesis yielding 1.2 L of
pleural fluid.

PROCEDURE:
An ultrasound guided thoracentesis was thoroughly discussed with the
patient and questions answered. The benefits, risks, alternatives
and complications were also discussed. The patient understands and
wishes to proceed with the procedure. Written consent was obtained.

Ultrasound was performed to localize and mark an adequate pocket of
fluid in the left chest. The area was then prepped and draped in the
normal sterile fashion. 1% Lidocaine was used for local anesthesia.
Under ultrasound guidance a 19 gauge Yueh catheter was introduced.
Thoracentesis was performed. The catheter was removed and a dressing
applied.

Complications:  No immediate

## 2015-05-10 ENCOUNTER — Other Ambulatory Visit: Payer: Self-pay | Admitting: Family Medicine

## 2015-05-10 NOTE — Telephone Encounter (Signed)
Is this okay to refill? 

## 2015-06-29 ENCOUNTER — Other Ambulatory Visit: Payer: Self-pay | Admitting: Family Medicine

## 2015-06-29 NOTE — Telephone Encounter (Signed)
Let her know that if she is needing a refill on her albuterol, it's time for a visit to readjust her breathing medications.

## 2015-06-29 NOTE — Telephone Encounter (Signed)
Is this okay?

## 2015-07-11 ENCOUNTER — Other Ambulatory Visit (HOSPITAL_COMMUNITY): Payer: BC Managed Care – PPO

## 2015-08-22 ENCOUNTER — Other Ambulatory Visit: Payer: Self-pay | Admitting: Family Medicine

## 2015-08-22 NOTE — Telephone Encounter (Signed)
LM to CB and schedule an appt

## 2016-02-21 ENCOUNTER — Other Ambulatory Visit: Payer: Self-pay | Admitting: Family Medicine

## 2016-02-28 ENCOUNTER — Ambulatory Visit (INDEPENDENT_AMBULATORY_CARE_PROVIDER_SITE_OTHER): Payer: BLUE CROSS/BLUE SHIELD | Admitting: Family Medicine

## 2016-02-28 ENCOUNTER — Encounter: Payer: Self-pay | Admitting: Family Medicine

## 2016-02-28 ENCOUNTER — Other Ambulatory Visit: Payer: Self-pay

## 2016-02-28 VITALS — BP 122/80 | HR 96 | Ht 61.0 in | Wt 200.0 lb

## 2016-02-28 DIAGNOSIS — E039 Hypothyroidism, unspecified: Secondary | ICD-10-CM

## 2016-02-28 DIAGNOSIS — J301 Allergic rhinitis due to pollen: Secondary | ICD-10-CM

## 2016-02-28 DIAGNOSIS — J449 Chronic obstructive pulmonary disease, unspecified: Secondary | ICD-10-CM

## 2016-02-28 DIAGNOSIS — E669 Obesity, unspecified: Secondary | ICD-10-CM

## 2016-02-28 DIAGNOSIS — F4321 Adjustment disorder with depressed mood: Secondary | ICD-10-CM

## 2016-02-28 DIAGNOSIS — J4520 Mild intermittent asthma, uncomplicated: Secondary | ICD-10-CM

## 2016-02-28 DIAGNOSIS — F17201 Nicotine dependence, unspecified, in remission: Secondary | ICD-10-CM | POA: Diagnosis not present

## 2016-02-28 DIAGNOSIS — R739 Hyperglycemia, unspecified: Secondary | ICD-10-CM

## 2016-02-28 DIAGNOSIS — R7309 Other abnormal glucose: Secondary | ICD-10-CM | POA: Diagnosis not present

## 2016-02-28 LAB — CBC WITH DIFFERENTIAL/PLATELET
BASOS PCT: 0 %
Basophils Absolute: 0 cells/uL (ref 0–200)
EOS ABS: 204 {cells}/uL (ref 15–500)
EOS PCT: 2 %
HEMATOCRIT: 42.6 % (ref 35.0–45.0)
Hemoglobin: 14.4 g/dL (ref 11.7–15.5)
LYMPHS ABS: 2142 {cells}/uL (ref 850–3900)
LYMPHS PCT: 21 %
MCH: 32.7 pg (ref 27.0–33.0)
MCHC: 33.8 g/dL (ref 32.0–36.0)
MCV: 96.6 fL (ref 80.0–100.0)
MONO ABS: 1020 {cells}/uL — AB (ref 200–950)
MONOS PCT: 10 %
MPV: 9 fL (ref 7.5–12.5)
NEUTROS PCT: 67 %
Neutro Abs: 6834 cells/uL (ref 1500–7800)
PLATELETS: 415 10*3/uL — AB (ref 140–400)
RBC: 4.41 MIL/uL (ref 3.80–5.10)
RDW: 12.9 % (ref 11.0–15.0)
WBC: 10.2 10*3/uL (ref 4.0–10.5)

## 2016-02-28 LAB — LIPID PANEL
Cholesterol: 231 mg/dL — ABNORMAL HIGH (ref 125–200)
HDL: 81 mg/dL (ref >=46)
LDL Cholesterol: 125 mg/dL (ref <130)
Total CHOL/HDL Ratio: 2.9 Ratio (ref <=5.0)
Triglycerides: 126 mg/dL (ref <150)
VLDL: 25 mg/dL (ref <30)

## 2016-02-28 LAB — COMPREHENSIVE METABOLIC PANEL
ALK PHOS: 84 U/L (ref 33–130)
ALT: 17 U/L (ref 6–29)
AST: 19 U/L (ref 10–35)
Albumin: 4.4 g/dL (ref 3.6–5.1)
BILIRUBIN TOTAL: 0.4 mg/dL (ref 0.2–1.2)
BUN: 12 mg/dL (ref 7–25)
CO2: 29 mmol/L (ref 20–31)
CREATININE: 0.98 mg/dL (ref 0.50–0.99)
Calcium: 9.8 mg/dL (ref 8.6–10.4)
Chloride: 100 mmol/L (ref 98–110)
GLUCOSE: 98 mg/dL (ref 65–99)
Potassium: 4.8 mmol/L (ref 3.5–5.3)
SODIUM: 141 mmol/L (ref 135–146)
Total Protein: 7.4 g/dL (ref 6.1–8.1)

## 2016-02-28 MED ORDER — ALBUTEROL SULFATE HFA 108 (90 BASE) MCG/ACT IN AERS: INHALATION_SPRAY | RESPIRATORY_TRACT | Status: DC

## 2016-02-28 MED ORDER — ALPRAZOLAM 0.25 MG PO TABS: 0.2500 mg | ORAL_TABLET | Freq: Two times a day (BID) | ORAL | Status: DC | PRN

## 2016-02-28 MED ORDER — MOMETASONE FUROATE 220 MCG/INH IN AEPB: INHALATION_SPRAY | RESPIRATORY_TRACT | Status: DC

## 2016-02-28 MED ORDER — LEVOTHYROXINE SODIUM 100 MCG PO TABS: 100.0000 ug | ORAL_TABLET | Freq: Every day | ORAL | Status: DC

## 2016-02-28 NOTE — Telephone Encounter (Signed)
Called xanax in per jcl 

## 2016-02-28 NOTE — Progress Notes (Signed)
   Subjective:    Patient ID: Cathy Moody, female    DOB: 09/24/1950, 65 y.o.   MRN: [id number]  HPI Is here for an interval evaluation. Since her last visit, her son I, boyfriend also died within several months of that as well as her dog. She is still grieving over all this and admits to being quite angry about this. She states that she is having a hard time accepting all of this. She also has underlying asthma as well as COPD and continues on Asmanex. She states that she cannot breathe when she goes outside but has not been using the albuterol when she does go for walk. She has a previous history of hypothyroidism and is on Synthroid. She is having no difficulty with that. She also has a history of elevated blood sugars. Her exercise is quite minimal. She gives multiple excuses for not exercising. She remains smoke free. Presently she is not working. No chest pain, abdominal pain or bowel habit changes.   Review of Systems     Objective:   Physical Exam Alert and in no distress. Tympanic membranes and canals are normal. Pharyngeal area is normal. Neck is supple without adenopathy or thyromegaly. Cardiac exam shows a regular sinus rhythm without murmurs or gallops. Lungs are clear to auscultation. skin is normal.        Assessment & Plan:  Grieving - Plan: ALPRAZolam (XANAX) 0.25 MG tablet  Obesity - Plan: CBC with Differential/Platelet, Comprehensive metabolic panel, Lipid panel  Asthma, mild intermittent, uncomplicated - Plan: mometasone (ASMANEX 60 METERED DOSES) 220 MCG/INH inhaler, albuterol (VENTOLIN HFA) 108 (90 Base) MCG/ACT inhaler  Hypothyroidism, unspecified hypothyroidism type - Plan: TSH, levothyroxine (SYNTHROID, LEVOTHROID) 100 MCG tablet  Elevated blood sugar - Plan: CBC with Differential/Platelet, Comprehensive metabolic panel  Tobacco abuse, in remission  Allergic rhinitis due to pollen  COPD, severe (HCC) - Plan: CBC with Differential/Platelet, Comprehensive  metabolic panel, mometasone (ASMANEX 60 METERED DOSES) 220 MCG/INH inhaler, albuterol (VENTOLIN HFA) 108 (90 Base) MCG/ACT inhaler I discussed the grieving with her. Strongly encouraged her to get involved in counseling mainly through Hospice. I will also give her Xanax for which she has not used this to any great amount, 20 of them lasting her a full year. She will continue on her asthma medications and I encouraged her to use albuterol and start walking. Continue on her allergy medicines as needed. Immunizations as well as colonoscopy and mammogram were offered however she refused. He plans to come back here in roughly 6 months.

## 2016-02-28 NOTE — Patient Instructions (Signed)
Call hospice [phone] 

## 2016-02-29 LAB — TSH: TSH: 6.35 mIU/L — ABNORMAL HIGH

## 2016-02-29 MED ORDER — LEVOTHYROXINE SODIUM 112 MCG PO TABS: 112.0000 ug | ORAL_TABLET | Freq: Every day | ORAL | Status: DC

## 2016-02-29 NOTE — Addendum Note (Signed)
Addended by: [name] on: 02/29/2016 04:38 AM   Modules accepted: Orders

## 2016-05-21 ENCOUNTER — Other Ambulatory Visit: Payer: Self-pay | Admitting: Family Medicine

## 2016-05-21 DIAGNOSIS — J449 Chronic obstructive pulmonary disease, unspecified: Secondary | ICD-10-CM

## 2016-05-21 DIAGNOSIS — J4520 Mild intermittent asthma, uncomplicated: Secondary | ICD-10-CM

## 2016-05-21 NOTE — Telephone Encounter (Signed)
Is this okay to refill? 

## 2016-06-20 ENCOUNTER — Other Ambulatory Visit: Payer: Self-pay | Admitting: Family Medicine

## 2016-06-20 DIAGNOSIS — J4520 Mild intermittent asthma, uncomplicated: Secondary | ICD-10-CM

## 2016-06-20 DIAGNOSIS — J449 Chronic obstructive pulmonary disease, unspecified: Secondary | ICD-10-CM

## 2016-08-01 ENCOUNTER — Other Ambulatory Visit: Payer: Self-pay | Admitting: Family Medicine

## 2016-08-01 DIAGNOSIS — J449 Chronic obstructive pulmonary disease, unspecified: Secondary | ICD-10-CM

## 2016-08-01 DIAGNOSIS — J4520 Mild intermittent asthma, uncomplicated: Secondary | ICD-10-CM

## 2016-08-29 ENCOUNTER — Other Ambulatory Visit: Payer: Self-pay | Admitting: Family Medicine

## 2016-08-29 DIAGNOSIS — J4520 Mild intermittent asthma, uncomplicated: Secondary | ICD-10-CM

## 2016-08-29 DIAGNOSIS — J449 Chronic obstructive pulmonary disease, unspecified: Secondary | ICD-10-CM

## 2016-09-15 ENCOUNTER — Other Ambulatory Visit: Payer: Self-pay | Admitting: Family Medicine

## 2016-12-13 ENCOUNTER — Other Ambulatory Visit: Payer: Self-pay | Admitting: Family Medicine

## 2016-12-19 ENCOUNTER — Other Ambulatory Visit: Payer: Self-pay | Admitting: Family Medicine

## 2016-12-19 DIAGNOSIS — J4520 Mild intermittent asthma, uncomplicated: Secondary | ICD-10-CM

## 2016-12-19 DIAGNOSIS — J449 Chronic obstructive pulmonary disease, unspecified: Secondary | ICD-10-CM

## 2016-12-19 NOTE — Telephone Encounter (Signed)
Is this okay to refill? Pt last appt is 02/2016

## 2016-12-19 NOTE — Telephone Encounter (Signed)
Time for an appointment.

## 2017-01-11 ENCOUNTER — Telehealth: Payer: Self-pay | Admitting: Family Medicine

## 2017-01-11 NOTE — Telephone Encounter (Signed)
Recv'd fax from [employer] that Ventolin is not on pt's covered list of drugs, no alternatives are listed

## 2017-01-28 ENCOUNTER — Telehealth: Payer: Self-pay | Admitting: Family Medicine

## 2017-01-28 NOTE — Telephone Encounter (Signed)
Called [employer] t# [phone] for alternative for Ventolin and covered alternative is Proair HFA, [facility] ?

## 2017-01-29 ENCOUNTER — Other Ambulatory Visit: Payer: Self-pay | Admitting: Family Medicine

## 2017-01-29 DIAGNOSIS — J449 Chronic obstructive pulmonary disease, unspecified: Secondary | ICD-10-CM

## 2017-01-29 DIAGNOSIS — J4520 Mild intermittent asthma, uncomplicated: Secondary | ICD-10-CM

## 2017-01-29 MED ORDER — ALBUTEROL SULFATE HFA 108 (90 BASE) MCG/ACT IN AERS
2.0000 | INHALATION_SPRAY | Freq: Four times a day (QID) | RESPIRATORY_TRACT | 1 refills | Status: DC | PRN
Start: 2017-01-29 — End: 2017-04-06

## 2017-03-12 ENCOUNTER — Other Ambulatory Visit: Payer: Self-pay | Admitting: Family Medicine

## 2017-04-06 ENCOUNTER — Other Ambulatory Visit: Payer: Self-pay | Admitting: Family Medicine

## 2017-04-06 NOTE — Telephone Encounter (Signed)
Is this okay to refill? 

## 2017-05-07 ENCOUNTER — Other Ambulatory Visit: Payer: Self-pay | Admitting: Family Medicine

## 2017-05-07 DIAGNOSIS — J449 Chronic obstructive pulmonary disease, unspecified: Secondary | ICD-10-CM

## 2017-05-07 DIAGNOSIS — J4520 Mild intermittent asthma, uncomplicated: Secondary | ICD-10-CM

## 2017-05-07 NOTE — Telephone Encounter (Signed)
Attempted call to pt- due for asthma appt. Has not been seen since 02/2016. Trixie Rude

## 2017-05-08 NOTE — Telephone Encounter (Signed)
LMTCB

## 2017-05-24 DIAGNOSIS — Z760 Encounter for issue of repeat prescription: Secondary | ICD-10-CM | POA: Diagnosis not present

## 2017-05-24 DIAGNOSIS — J45909 Unspecified asthma, uncomplicated: Secondary | ICD-10-CM | POA: Diagnosis not present

## 2017-06-18 ENCOUNTER — Telehealth: Payer: Self-pay | Admitting: Family Medicine

## 2017-06-18 NOTE — Telephone Encounter (Signed)
Left message for pt to call. Needs a welcome to medicare visit.

## 2017-07-04 ENCOUNTER — Other Ambulatory Visit: Payer: Self-pay | Admitting: Family Medicine

## 2017-07-06 NOTE — Telephone Encounter (Signed)
Attempted call to pt mobile- VCM full. Home VCM full. 30 day supply sent.

## 2017-07-09 ENCOUNTER — Other Ambulatory Visit: Payer: Self-pay | Admitting: Family Medicine

## 2017-07-09 ENCOUNTER — Telehealth: Payer: Self-pay | Admitting: Family Medicine

## 2017-07-09 MED ORDER — ALBUTEROL SULFATE HFA 108 (90 BASE) MCG/ACT IN AERS: INHALATION_SPRAY | RESPIRATORY_TRACT | 0 refills | Status: DC

## 2017-07-09 NOTE — Telephone Encounter (Signed)
Called pt to set up appt with fasting labs.  She is coming in Dec 17.  Dr. Susann Givens ok 1 more refill of Ventolin. [facility] [address].  Pt aware.

## 2017-08-04 ENCOUNTER — Other Ambulatory Visit: Payer: Self-pay | Admitting: Family Medicine

## 2017-08-10 ENCOUNTER — Encounter: Payer: Self-pay | Admitting: Family Medicine

## 2017-08-10 ENCOUNTER — Ambulatory Visit: Payer: PPO | Admitting: Family Medicine

## 2017-08-10 VITALS — BP 122/72 | HR 108 | Wt 208.0 lb

## 2017-08-10 DIAGNOSIS — Z6836 Body mass index (BMI) 36.0-36.9, adult: Secondary | ICD-10-CM

## 2017-08-10 DIAGNOSIS — Z1211 Encounter for screening for malignant neoplasm of colon: Secondary | ICD-10-CM

## 2017-08-10 DIAGNOSIS — J4540 Moderate persistent asthma, uncomplicated: Secondary | ICD-10-CM

## 2017-08-10 DIAGNOSIS — F17201 Nicotine dependence, unspecified, in remission: Secondary | ICD-10-CM

## 2017-08-10 DIAGNOSIS — J301 Allergic rhinitis due to pollen: Secondary | ICD-10-CM

## 2017-08-10 DIAGNOSIS — Z23 Encounter for immunization: Secondary | ICD-10-CM | POA: Diagnosis not present

## 2017-08-10 DIAGNOSIS — Z1159 Encounter for screening for other viral diseases: Secondary | ICD-10-CM | POA: Diagnosis not present

## 2017-08-10 DIAGNOSIS — E6609 Other obesity due to excess calories: Secondary | ICD-10-CM | POA: Diagnosis not present

## 2017-08-10 DIAGNOSIS — E039 Hypothyroidism, unspecified: Secondary | ICD-10-CM | POA: Diagnosis not present

## 2017-08-10 DIAGNOSIS — J449 Chronic obstructive pulmonary disease, unspecified: Secondary | ICD-10-CM | POA: Diagnosis not present

## 2017-08-10 DIAGNOSIS — R7309 Other abnormal glucose: Secondary | ICD-10-CM | POA: Diagnosis not present

## 2017-08-10 DIAGNOSIS — F4321 Adjustment disorder with depressed mood: Secondary | ICD-10-CM

## 2017-08-10 DIAGNOSIS — E66812 Obesity, class 2: Secondary | ICD-10-CM

## 2017-08-10 MED ORDER — ALBUTEROL SULFATE HFA 108 (90 BASE) MCG/ACT IN AERS: INHALATION_SPRAY | RESPIRATORY_TRACT | 0 refills | Status: DC

## 2017-08-10 MED ORDER — ALPRAZOLAM 0.25 MG PO TABS
0.2500 mg | ORAL_TABLET | Freq: Two times a day (BID) | ORAL | 0 refills | Status: DC | PRN
Start: 2017-08-10 — End: 2017-08-21

## 2017-08-10 MED ORDER — FLUTICASONE FUROATE-VILANTEROL 100-25 MCG/INH IN AEPB: 1.0000 | INHALATION_SPRAY | Freq: Every day | RESPIRATORY_TRACT | Status: DC

## 2017-08-10 NOTE — Patient Instructions (Signed)
If you have to use your inhaler more than twice a week during the day or twice a month at night, you are not under control and need to call me

## 2017-08-11 ENCOUNTER — Encounter: Payer: Self-pay | Admitting: Family Medicine

## 2017-08-11 NOTE — Progress Notes (Signed)
Cathy Moody is a 66 y.o. female who presents for annual wellness visit and follow-up on chronic medical conditions.  She has the following concerns: She has been having difficulty with her asthma stating that she has to use her rescue inhaler several times per week.  She would like refills.  Her allergies seem to be under good control.  She continues to remain smoke-free.  Continues on her thyroid medication however review of her previous lab data does show that her TSH was elevated.  She does have difficulty with her weight and admits to not eating properly or exercising regularly. She also has difficulty this time a year psychologically due to the loss of 1 of her sons and her boyfriend that she was living with. Immunizations and Health Maintenance Immunization History  Administered Date(s) Administered  . Influenza Split 05/24/2013  . Influenza-Unspecified 05/25/2014  . Pneumococcal Conjugate-13 08/10/2017  . Tdap 08/25/2010   Health Maintenance Due  Topic Date Due  . Hepatitis C Screening  17-May-1951  . HIV Screening  12/24/1966  . PAP SMEAR  12/23/1972  . MAMMOGRAM  12/23/2001  . COLONOSCOPY  12/23/2001  . DEXA SCAN  12/23/2016  . INFLUENZA VACCINE  03/25/2017    Last Pap smear: Several years ago Last mammogram: Not done recently  last colonoscopy: Not done Last DEXA: Does not want one  dentist: Does not see one regularly  Ophtho:? Exercise: No regular regimen  Other doctors caring for patient include:---  Advanced directives: Does Patient Have a Medical Advance Directive?: No Would patient like information on creating a medical advance directive?: Yes (MAU/Ambulatory/Procedural Areas - Information given)  Depression screen:  See questionnaire below.  Depression screen PHQ 2/9 08/10/2017  Decreased Interest 0  Down, Depressed, Hopeless 1  PHQ - 2 Score 1    Fall Risk Screen: see questionnaire below. Fall Risk  08/10/2017  Falls in the past year? No    ADL screen:   See questionnaire below Functional Status Survey: Is the patient deaf or have difficulty hearing?: No Does the patient have difficulty seeing, even when wearing glasses/contacts?: No Does the patient have difficulty concentrating, remembering, or making decisions?: No Does the patient have difficulty walking or climbing stairs?: Yes(asthma causes issues.) Does the patient have difficulty dressing or bathing?: No Does the patient have difficulty doing errands alone such as visiting a doctor's office or shopping?: No   Review of Systems Constitutional: -, -unexpected weight change, -anorexia, -fatigue  Dermatology: denies changing moles, rash, lumps ENT: -runny nose, -ear pain, -sore throat,  Cardiology:  -chest pain, -palpitations, -orthopnea, Respiratory: -cough, -shortness of breath, -dyspnea on exertion, -wheezing,  Gastroenterology: -abdominal pain, -nausea, -vomiting, -diarrhea, -constipation, -dysphagia Hematology: -bleeding or bruising problems Musculoskeletal: -arthralgias, -myalgias, -joint swelling, -back pain, - Ophthalmology: -vision changes,  Urology: -dysuria, -difficulty urinating,  -urinary frequency, -urgency, incontinence Neurology: -, -numbness, , -memory loss, -falls, -dizziness    PHYSICAL EXAM:  BP 122/72 (BP Location: Right Arm, Patient Position: Sitting)   Pulse (!) 108   Wt 208 lb (94.3 kg)   SpO2 97%   BMI 39.30 kg/m   General Appearance: Alert, cooperative, no distress, appears stated age Head: Normocephalic, without obvious abnormality, atraumatic Eyes: PERRL, conjunctiva/corneas clear, EOM's intact, fundi benign Ears: Normal TM's and external ear canals Nose: Nares normal, mucosa normal, no drainage or sinus tenderness Throat: Lips, mucosa, and tongue normal; teeth and gums normal Neck: Supple, no lymphadenopathy;  thyroid:  no enlargement/tenderness/nodules; no carotid bruit or JVD Lungs: Clear to  auscultation bilaterally without wheezes, rales  or ronchi; respirations unlabored Heart: Regular rate and rhythm, S1 and S2 normal, no murmur, rubor gallop Abdomen: Soft, non-tender, nondistended, normoactive bowel sounds,  no masses, no hepatosplenomegaly Extremities: No clubbing, cyanosis or edema Pulses: 2+ and symmetric all extremities Skin:  Skin color, texture, turgor normal, no rashes or lesions Lymph nodes: Cervical, supraclavicular, and axillary nodes normal Neurologic:  CNII-XII intact, normal strength, sensation and gait; reflexes 2+ and symmetric throughout Psych: Normal mood, affect, hygiene and grooming.  ASSESSMENT/PLAN: Moderate persistent asthma without complication - Plan: fluticasone furoate-vilanterol (BREO ELLIPTA) 100-25 MCG/INH 1 puff, albuterol (VENTOLIN HFA) 108 (90 Base) MCG/ACT inhaler  Grieving - Plan: ALPRAZolam (XANAX) 0.25 MG tablet  Class 2 obesity due to excess calories without serious comorbidity with body mass index (BMI) of 36.0 to 36.9 in adult - Plan: CBC with Differential/Platelet, Comprehensive metabolic panel, Lipid panel  Tobacco abuse, in remission  COPD, severe (HCC)  Allergic rhinitis due to pollen, unspecified seasonality  Need for vaccination against Streptococcus pneumoniae - Plan: Pneumococcal conjugate vaccine 13-valent  Screening for colon cancer - Plan: Cologuard  Need for hepatitis C screening test - Plan: Hepatitis C antibody  Hypothyroidism, unspecified type - Plan: TSH I discussed the issues she is having with the fact that her son died and her boyfriend.  Strongly encouraged her to continue to move forward on this and not let it become a detriment to every holiday.  Discussed the fact that these issues need to be put in the proper place in her life, not ever to be forgotten but not to be dwelled on. Discussed diet and exercise with her especially in regard to 20 minutes of something physical on a daily basis as well as cutting back on carbohydrates. [facility]  She is to follow-up with me in roughly 1 month concerning that.  Follow-up blood work when it returns. I discussed Pap and mammogram and at this point she is not interested.  Also recommend she get the Shingrix vaccine. Medicare Attestation I have personally reviewed: The patient's medical and social history Their use of alcohol, tobacco or illicit drugs Their current medications and supplements The patient's functional ability including ADLs,fall risks, home safety risks, cognitive, and hearing and visual impairment Diet and physical activities Evidence for depression or mood disorders  The patient's weight, height, and BMI have been recorded in the chart.  I have made referrals, counseling, and provided education to the patient based on review of the above and I have provided the patient with a written personalized care plan for preventive services.     Sharlot GowdaJohn Alaysiah Browder, MD   08/11/2017

## 2017-08-13 LAB — CBC WITH DIFFERENTIAL/PLATELET
BASOS ABS: 39 {cells}/uL (ref 0–200)
BASOS PCT: 0.5 %
EOS ABS: 140 {cells}/uL (ref 15–500)
Eosinophils Relative: 1.8 %
HEMATOCRIT: 41.5 % (ref 35.0–45.0)
HEMOGLOBIN: 14.3 g/dL (ref 11.7–15.5)
LYMPHS ABS: 1334 {cells}/uL (ref 850–3900)
MCH: 33.2 pg — AB (ref 27.0–33.0)
MCHC: 34.5 g/dL (ref 32.0–36.0)
MCV: 96.3 fL (ref 80.0–100.0)
MPV: 9.2 fL (ref 7.5–12.5)
Monocytes Relative: 7.1 %
NEUTROS ABS: 5733 {cells}/uL (ref 1500–7800)
Neutrophils Relative %: 73.5 %
Platelets: 368 10*3/uL (ref 140–400)
RBC: 4.31 10*6/uL (ref 3.80–5.10)
RDW: 11.7 % (ref 11.0–15.0)
Total Lymphocyte: 17.1 %
WBC mixed population: 554 cells/uL (ref 200–950)
WBC: 7.8 10*3/uL (ref 3.8–10.8)

## 2017-08-13 LAB — TEST AUTHORIZATION

## 2017-08-13 LAB — LIPID PANEL
Cholesterol: 228 mg/dL — ABNORMAL HIGH (ref <200)
HDL: 70 mg/dL (ref >50)
LDL Cholesterol (Calc): 141 mg/dL (calc) — ABNORMAL HIGH
NON-HDL CHOLESTEROL (CALC): 158 mg/dL — AB (ref <130)
TRIGLYCERIDES: 71 mg/dL (ref <150)
Total CHOL/HDL Ratio: 3.3 (calc) (ref <5.0)

## 2017-08-13 LAB — COMPREHENSIVE METABOLIC PANEL
AG RATIO: 1.6 (calc) (ref 1.0–2.5)
ALT: 20 U/L (ref 6–29)
AST: 19 U/L (ref 10–35)
Albumin: 4.1 g/dL (ref 3.6–5.1)
Alkaline phosphatase (APISO): 72 U/L (ref 33–130)
BILIRUBIN TOTAL: 0.4 mg/dL (ref 0.2–1.2)
BUN: 14 mg/dL (ref 7–25)
CALCIUM: 9.3 mg/dL (ref 8.6–10.4)
CHLORIDE: 103 mmol/L (ref 98–110)
CO2: 29 mmol/L (ref 20–32)
Creat: 0.84 mg/dL (ref 0.50–0.99)
GLOBULIN: 2.6 g/dL (ref 1.9–3.7)
Glucose, Bld: 127 mg/dL — ABNORMAL HIGH (ref 65–99)
Potassium: 5.4 mmol/L — ABNORMAL HIGH (ref 3.5–5.3)
SODIUM: 141 mmol/L (ref 135–146)
TOTAL PROTEIN: 6.7 g/dL (ref 6.1–8.1)

## 2017-08-13 LAB — HEPATITIS C ANTIBODY
Hepatitis C Ab: NONREACTIVE
SIGNAL TO CUT-OFF: 0.04 (ref <1.00)

## 2017-08-13 LAB — HEMOGLOBIN A1C W/OUT EAG: Hgb A1c MFr Bld: 6.1 % of total Hgb — ABNORMAL HIGH (ref <5.7)

## 2017-08-13 LAB — TSH: TSH: 2.16 m[IU]/L (ref 0.40–4.50)

## 2017-08-21 ENCOUNTER — Telehealth: Payer: Self-pay

## 2017-08-21 DIAGNOSIS — F4321 Adjustment disorder with depressed mood: Secondary | ICD-10-CM

## 2017-08-21 MED ORDER — FLUTICASONE FUROATE-VILANTEROL 100-25 MCG/INH IN AEPB: 1.0000 | INHALATION_SPRAY | Freq: Every day | RESPIRATORY_TRACT | 11 refills | Status: DC

## 2017-08-21 MED ORDER — ALPRAZOLAM 0.25 MG PO TABS
0.2500 mg | ORAL_TABLET | Freq: Two times a day (BID) | ORAL | 0 refills | Status: DC | PRN
Start: 2017-08-21 — End: 2017-11-07

## 2017-08-21 NOTE — Addendum Note (Signed)
Addended by: [name] on: 08/21/2017 03:10 PM   Modules accepted: Orders

## 2017-08-21 NOTE — Telephone Encounter (Signed)
The prescription was called to [facility]

## 2017-08-21 NOTE — Telephone Encounter (Signed)
Pt did not rcvd refill of Xanax on 08/10/2017, was this supposed to be called in or do you want to call in.  She also would script for breo. [facility] [address]  Trixie Rude

## 2017-08-21 NOTE — Telephone Encounter (Signed)
Ok to renew breo as well?

## 2017-08-24 ENCOUNTER — Telehealth: Payer: Self-pay | Admitting: Family Medicine

## 2017-08-24 DIAGNOSIS — J4540 Moderate persistent asthma, uncomplicated: Secondary | ICD-10-CM

## 2017-08-24 MED ORDER — LEVOTHYROXINE SODIUM 112 MCG PO TABS: 112.0000 ug | ORAL_TABLET | Freq: Every day | ORAL | 0 refills | Status: DC

## 2017-08-24 MED ORDER — FLUTICASONE FUROATE-VILANTEROL 100-25 MCG/INH IN AEPB: 1.0000 | INHALATION_SPRAY | Freq: Every day | RESPIRATORY_TRACT | 11 refills | Status: DC

## 2017-08-24 MED ORDER — ALBUTEROL SULFATE HFA 108 (90 BASE) MCG/ACT IN AERS: INHALATION_SPRAY | RESPIRATORY_TRACT | 0 refills | Status: DC

## 2017-08-24 NOTE — Telephone Encounter (Signed)
Pt called stating that she still has not received her meds. Breo, Xanax, Synthroid, and Ventolin [facility] have [facility] [address]

## 2017-08-24 NOTE — Telephone Encounter (Signed)
Not sure if I can just call X[facility]ax in to [facility] since you already called into [facility]. Sent other meds. Trixie Rude

## 2017-09-15 ENCOUNTER — Other Ambulatory Visit: Payer: Self-pay

## 2017-09-15 MED ORDER — LEVOTHYROXINE SODIUM 112 MCG PO TABS: 112.0000 ug | ORAL_TABLET | Freq: Every day | ORAL | 0 refills | Status: DC

## 2017-09-28 ENCOUNTER — Other Ambulatory Visit: Payer: Self-pay

## 2017-09-28 MED ORDER — LEVOTHYROXINE SODIUM 112 MCG PO TABS: 112.0000 ug | ORAL_TABLET | Freq: Every day | ORAL | 2 refills | Status: DC

## 2017-10-02 ENCOUNTER — Other Ambulatory Visit: Payer: Self-pay | Admitting: Family Medicine

## 2017-10-02 DIAGNOSIS — J4540 Moderate persistent asthma, uncomplicated: Secondary | ICD-10-CM

## 2017-11-06 ENCOUNTER — Telehealth: Payer: Self-pay | Admitting: Family Medicine

## 2017-11-06 DIAGNOSIS — F4321 Adjustment disorder with depressed mood: Secondary | ICD-10-CM

## 2017-11-06 DIAGNOSIS — J4540 Moderate persistent asthma, uncomplicated: Secondary | ICD-10-CM

## 2017-11-06 NOTE — Telephone Encounter (Signed)
ALERT PT HAS A NEW PHARMACY. Pt states that albuterol and Xanax sent to wrong pharmacy back in December and she never picked them up or called to have changed. She now needs both filled. Please send NEW PHARMACY  [facility] [address]. Pt can be reached at [phone].

## 2017-11-07 MED ORDER — ALPRAZOLAM 0.25 MG PO TABS: 0.2500 mg | ORAL_TABLET | Freq: Two times a day (BID) | ORAL | 0 refills | Status: DC | PRN

## 2017-11-07 MED ORDER — ALBUTEROL SULFATE HFA 108 (90 BASE) MCG/ACT IN AERS: INHALATION_SPRAY | RESPIRATORY_TRACT | 0 refills | Status: DC

## 2017-11-09 ENCOUNTER — Other Ambulatory Visit: Payer: Self-pay | Admitting: Medical

## 2017-11-09 DIAGNOSIS — J4540 Moderate persistent asthma, uncomplicated: Secondary | ICD-10-CM

## 2017-11-30 ENCOUNTER — Other Ambulatory Visit: Payer: Self-pay | Admitting: Family Medicine

## 2017-11-30 DIAGNOSIS — J4540 Moderate persistent asthma, uncomplicated: Secondary | ICD-10-CM

## 2017-11-30 NOTE — Telephone Encounter (Signed)
Pt was called and was informed she needed to follow up appt on her asthma. Pt says she will call back and make appt due to her going out of town and unsure when she will return. KH 11-30-17

## 2017-11-30 NOTE — Telephone Encounter (Signed)
[facility] [address] [facility] pro air. Please advise. KH

## 2017-11-30 NOTE — Telephone Encounter (Signed)
Have her set up an appointment for follow-up on her asthma

## 2018-01-05 ENCOUNTER — Other Ambulatory Visit: Payer: Self-pay | Admitting: Family Medicine

## 2018-01-05 DIAGNOSIS — J4540 Moderate persistent asthma, uncomplicated: Secondary | ICD-10-CM

## 2018-01-05 NOTE — Telephone Encounter (Signed)
[facility] albuterol inhaler Please advise KH 

## 2018-01-05 NOTE — Telephone Encounter (Signed)
Called pt to make a med check appt . Pt did not answer and vm full. KH

## 2018-01-05 NOTE — Telephone Encounter (Signed)
She needs an appointment but do not let her run out

## 2018-02-05 ENCOUNTER — Other Ambulatory Visit: Payer: Self-pay | Admitting: Family Medicine

## 2018-02-05 DIAGNOSIS — J4540 Moderate persistent asthma, uncomplicated: Secondary | ICD-10-CM

## 2018-02-05 NOTE — Telephone Encounter (Signed)
Pt has an appt in July 

## 2018-02-27 ENCOUNTER — Other Ambulatory Visit: Payer: Self-pay | Admitting: Family Medicine

## 2018-02-27 DIAGNOSIS — J4540 Moderate persistent asthma, uncomplicated: Secondary | ICD-10-CM

## 2018-03-01 NOTE — Telephone Encounter (Signed)
[facility] al[facility]erol inhaler. Please advise KH 

## 2018-03-23 ENCOUNTER — Ambulatory Visit (INDEPENDENT_AMBULATORY_CARE_PROVIDER_SITE_OTHER): Payer: PPO | Admitting: Family Medicine

## 2018-03-23 ENCOUNTER — Encounter: Payer: Self-pay | Admitting: Family Medicine

## 2018-03-23 VITALS — BP 134/82 | HR 93 | Temp 98.1°F | Wt 214.2 lb

## 2018-03-23 DIAGNOSIS — F411 Generalized anxiety disorder: Secondary | ICD-10-CM | POA: Diagnosis not present

## 2018-03-23 DIAGNOSIS — I272 Pulmonary hypertension, unspecified: Secondary | ICD-10-CM

## 2018-03-23 DIAGNOSIS — F17201 Nicotine dependence, unspecified, in remission: Secondary | ICD-10-CM | POA: Diagnosis not present

## 2018-03-23 DIAGNOSIS — J301 Allergic rhinitis due to pollen: Secondary | ICD-10-CM | POA: Diagnosis not present

## 2018-03-23 DIAGNOSIS — Z952 Presence of prosthetic heart valve: Secondary | ICD-10-CM

## 2018-03-23 DIAGNOSIS — E039 Hypothyroidism, unspecified: Secondary | ICD-10-CM | POA: Diagnosis not present

## 2018-03-23 DIAGNOSIS — Z1211 Encounter for screening for malignant neoplasm of colon: Secondary | ICD-10-CM

## 2018-03-23 DIAGNOSIS — E2839 Other primary ovarian failure: Secondary | ICD-10-CM | POA: Diagnosis not present

## 2018-03-23 DIAGNOSIS — J449 Chronic obstructive pulmonary disease, unspecified: Secondary | ICD-10-CM | POA: Diagnosis not present

## 2018-03-23 DIAGNOSIS — J4540 Moderate persistent asthma, uncomplicated: Secondary | ICD-10-CM

## 2018-03-23 MED ORDER — ALBUTEROL SULFATE HFA 108 (90 BASE) MCG/ACT IN AERS: INHALATION_SPRAY | RESPIRATORY_TRACT | 0 refills | Status: DC

## 2018-03-23 NOTE — Progress Notes (Signed)
   Subjective:    Patient ID: Cathy Moody, female    DOB: 09-09-1950, 67 y.o.   MRN: [id number]  HPI She is here for medication check.  She continues on [facility] but still is using albuterol inhaler intermittently.  She states that her breathing does interfere with her physical activities.  She does have underlying allergies and they seem to be under good control.  She has had a she continues on Synthroid and having no difficulty with that.  She rarely uses Xanax.  TAVR and seems to be doing well with that.  Quit smoking several years ago.  Review of Systems     Objective:   Physical Exam Alert and in no distress. Tympanic membranes and canals are normal. Pharyngeal area is normal. Neck is supple without adenopathy or thyromegaly. Cardiac exam shows a regular sinus rhythm without murmurs or gallops. Lungs are clear to auscultation.        Assessment & Plan:  Tobacco abuse, in remission  Allergic rhinitis due to pollen, unspecified seasonality  Pulmonary hypertension, moderate to severe (HCC)  COPD, severe (HCC) - Plan: Ambulatory referral to Pulmonology  Anxiety state  Moderate persistent asthma without complication - Plan: albuterol (PROAIR HFA) 108 (90 Base) MCG/ACT inhaler  S/P TAVR (transcatheter aortic valve replacement)  Estrogen deficiency - Plan: DG Bone Density  Screening for colon cancer - Plan: Cologuard  Morbid obesity (HCC)  Hypothyroidism, unspecified type She will continue on her present inhalers and I will refer to pulmonary for more formal testing and possibly switching some of her medications around.  Continue to treat the allergies as she has been.  Congratulated her on her smoking cessation.  She will set up her own mammogram.  Encouraged her to follow-up with cardiovascular concerning her TAVR.  Discussed weight loss with her but will work on getting her lung function in better shape before we work further on her weight reduction.  In general she was  reluctant to make any major changes in her lifestyle.

## 2018-03-25 ENCOUNTER — Encounter: Payer: Self-pay | Admitting: Family Medicine

## 2018-04-01 ENCOUNTER — Institutional Professional Consult (permissible substitution): Payer: Self-pay | Admitting: Internal Medicine

## 2018-05-20 ENCOUNTER — Other Ambulatory Visit: Payer: Self-pay | Admitting: Family Medicine

## 2018-05-20 DIAGNOSIS — J4540 Moderate persistent asthma, uncomplicated: Secondary | ICD-10-CM

## 2018-05-20 NOTE — Telephone Encounter (Signed)
[facility] al[facility]ol. Please advise. kh

## 2018-05-26 ENCOUNTER — Telehealth: Payer: Self-pay

## 2018-05-26 NOTE — Telephone Encounter (Signed)
Sent pt a letter due to her home and cell vm being full . Pt was advised that her cologuard. Needed to be completed and to call us if she needs anything such as a new kit. [place]

## 2018-05-27 ENCOUNTER — Encounter: Payer: Self-pay | Admitting: Family Medicine

## 2018-06-08 ENCOUNTER — Other Ambulatory Visit: Payer: Self-pay | Admitting: Family Medicine

## 2018-06-08 DIAGNOSIS — J4540 Moderate persistent asthma, uncomplicated: Secondary | ICD-10-CM

## 2018-06-08 NOTE — Telephone Encounter (Signed)
[facility] is [facility] albuterol. Please advise KH 

## 2018-06-25 ENCOUNTER — Other Ambulatory Visit: Payer: Self-pay | Admitting: Family Medicine

## 2018-06-25 DIAGNOSIS — J4540 Moderate persistent asthma, uncomplicated: Secondary | ICD-10-CM

## 2018-06-25 NOTE — Telephone Encounter (Signed)
[facility] is [facility] albuterol. Please advise KH 

## 2018-06-25 NOTE — Telephone Encounter (Signed)
Pt cell phones are full and could not leave messages. KH

## 2018-06-25 NOTE — Telephone Encounter (Signed)
Have her come in for an appointment for follow-up on her asthma

## 2018-07-12 ENCOUNTER — Telehealth: Payer: Self-pay

## 2018-07-12 NOTE — Telephone Encounter (Signed)
Called pt to advise CPE is needed. No answer and pt mailbox was full. KH 07-12-18

## 2018-07-30 ENCOUNTER — Other Ambulatory Visit: Payer: Self-pay | Admitting: Family Medicine

## 2018-07-30 DIAGNOSIS — J4540 Moderate persistent asthma, uncomplicated: Secondary | ICD-10-CM

## 2018-07-30 NOTE — Telephone Encounter (Signed)
Is this ok to refill?  I spoke to patient that she doesn't live in town and doesn't know when she would be able to come in.   I tried to schedule her an appointment but she refused.

## 2018-08-17 ENCOUNTER — Other Ambulatory Visit: Payer: Self-pay | Admitting: Family Medicine

## 2018-08-17 DIAGNOSIS — J4540 Moderate persistent asthma, uncomplicated: Secondary | ICD-10-CM

## 2018-08-17 NOTE — Telephone Encounter (Signed)
[facility] is [facility] albuterol. Please advise KH 

## 2018-08-22 ENCOUNTER — Other Ambulatory Visit: Payer: Self-pay | Admitting: Family Medicine

## 2018-08-23 NOTE — Telephone Encounter (Signed)
[facility] Breo. Please advise [facility]

## 2018-10-01 ENCOUNTER — Other Ambulatory Visit: Payer: Self-pay | Admitting: Family Medicine

## 2018-10-01 NOTE — Telephone Encounter (Signed)
Called pt to advise of appt needed. No answer and could not lvm KH

## 2018-10-16 DIAGNOSIS — J45909 Unspecified asthma, uncomplicated: Secondary | ICD-10-CM | POA: Diagnosis not present

## 2018-10-16 DIAGNOSIS — Z760 Encounter for issue of repeat prescription: Secondary | ICD-10-CM | POA: Diagnosis not present

## 2018-11-01 ENCOUNTER — Other Ambulatory Visit: Payer: Self-pay | Admitting: Family Medicine

## 2018-11-24 ENCOUNTER — Other Ambulatory Visit: Payer: Self-pay | Admitting: Family Medicine

## 2018-11-24 NOTE — Telephone Encounter (Signed)
No labs since 2018? Please advise on refill. Last appt was in July for med check

## 2018-11-24 NOTE — Telephone Encounter (Signed)
Tried to call pt but vm is full. pt is due for an IN OFFICE VISIT with JCL AS SHE IS DUE FOR LABS PER JCL, if she does not want to come in, we can do a phone call visit and give enough for 1-2 months until she can come in to have labs done

## 2019-01-03 ENCOUNTER — Other Ambulatory Visit: Payer: Self-pay | Admitting: Family Medicine

## 2019-01-03 DIAGNOSIS — J4540 Moderate persistent asthma, uncomplicated: Secondary | ICD-10-CM

## 2019-01-03 NOTE — Telephone Encounter (Signed)
[facility] is [facility] albuterol. Please advise KH 

## 2019-01-26 ENCOUNTER — Other Ambulatory Visit: Payer: Self-pay | Admitting: Family Medicine

## 2019-01-26 DIAGNOSIS — J4540 Moderate persistent asthma, uncomplicated: Secondary | ICD-10-CM

## 2019-01-26 MED ORDER — LEVOTHYROXINE SODIUM 112 MCG PO TABS: 112.0000 ug | ORAL_TABLET | Freq: Every day | ORAL | 0 refills | Status: DC

## 2019-01-26 NOTE — Addendum Note (Signed)
Addended by: [name] on: 01/26/2019 03:09 PM   Modules accepted: Orders

## 2019-01-26 NOTE — Telephone Encounter (Signed)
Needs appt

## 2019-01-26 NOTE — Telephone Encounter (Signed)
I gave her a 30-day supply 

## 2019-01-26 NOTE — Telephone Encounter (Signed)
Spoke to pt and advise an appointment was needed . Pt advised me that she would speak to her son about having a visit and call to make appointment . Pt asked for her thyroid med to be filled but due not having labs since 07/2017 pt was advised that an appointment is needed pt proceeded to advise that she has had labs since than. Please advise if you are ok filling thyroid med. KH

## 2019-01-26 NOTE — Telephone Encounter (Signed)
[facility] albuteol inhaler . Please advise [facility]

## 2019-02-18 ENCOUNTER — Other Ambulatory Visit: Payer: Self-pay | Admitting: Family Medicine

## 2019-02-18 NOTE — Telephone Encounter (Signed)
Pt was notified that she needs an appt. Pt will call back. Advised her that she would have to be seen before the 30 days run out

## 2019-02-18 NOTE — Telephone Encounter (Signed)
Is this okay to refill? Looks like pt comes in once a year and is due after 7/30. Ok to refill for a month

## 2019-02-25 ENCOUNTER — Other Ambulatory Visit: Payer: Self-pay | Admitting: Family Medicine

## 2019-02-25 DIAGNOSIS — J4540 Moderate persistent asthma, uncomplicated: Secondary | ICD-10-CM

## 2019-02-28 NOTE — Telephone Encounter (Signed)
Pt was contacted in the past for an appointment an was advised she would call back due her being out of town until [place]. Just spoke to pt and this is still the case. Please advise [facility]

## 2019-03-13 ENCOUNTER — Other Ambulatory Visit: Payer: Self-pay | Admitting: Family Medicine

## 2019-03-14 NOTE — Telephone Encounter (Signed)
Pt still has not called and made appt per our request. Please advise [facility]

## 2019-03-24 ENCOUNTER — Other Ambulatory Visit: Payer: Self-pay

## 2019-03-29 ENCOUNTER — Other Ambulatory Visit: Payer: Self-pay | Admitting: Family Medicine

## 2019-03-29 DIAGNOSIS — J4540 Moderate persistent asthma, uncomplicated: Secondary | ICD-10-CM

## 2019-03-29 NOTE — Telephone Encounter (Signed)
[facility] inhaler . Pt has yet to call for an appt. Please advise [facility]

## 2019-04-05 ENCOUNTER — Telehealth: Payer: Self-pay

## 2019-04-05 NOTE — Telephone Encounter (Signed)
Awaiting pt to call for appt . Pt stated she was out of town and will make appt when she returned. [place]

## 2019-04-08 ENCOUNTER — Other Ambulatory Visit: Payer: Self-pay | Admitting: Family Medicine

## 2019-04-08 NOTE — Telephone Encounter (Signed)
Is this ok to refill?   Patient still has not made her appointment yet.

## 2019-04-08 NOTE — Telephone Encounter (Signed)
Find out when she is going to schedule to come in.  I can cover her for a few months but I am not going to keep renewing this

## 2019-04-13 ENCOUNTER — Other Ambulatory Visit: Payer: Self-pay | Admitting: Family Medicine

## 2019-04-13 DIAGNOSIS — J4540 Moderate persistent asthma, uncomplicated: Secondary | ICD-10-CM

## 2019-04-13 NOTE — Telephone Encounter (Signed)
Pt advised  she is not been back in town and she would call when she returned . Pt was advised that med would be sent in for thirty days and she would call back when she returned. [place]

## 2019-04-13 NOTE — Telephone Encounter (Signed)
Pt advised she would call when she comes back into town. Pt was advised earlier that only a thirty day supply would be sent in. Please advise [facility]

## 2019-05-07 ENCOUNTER — Other Ambulatory Visit: Payer: Self-pay | Admitting: Family Medicine

## 2019-05-09 ENCOUNTER — Other Ambulatory Visit: Payer: Self-pay | Admitting: Family Medicine

## 2019-05-09 DIAGNOSIS — J4540 Moderate persistent asthma, uncomplicated: Secondary | ICD-10-CM

## 2019-05-09 NOTE — Telephone Encounter (Signed)
Please advise if we ar going to fill pt med due to pt not making  appt. [place]

## 2019-05-09 NOTE — Telephone Encounter (Signed)
Pt has not came in for a med check or CPE. Pt has been called multi times and has not set up an appt . Pt was called again today and n answer voice mail was full. [place]

## 2019-05-09 NOTE — Telephone Encounter (Signed)
She needs a follow-up appointment 

## 2019-06-01 ENCOUNTER — Other Ambulatory Visit: Payer: Self-pay | Admitting: Family Medicine

## 2019-06-01 DIAGNOSIS — J4540 Moderate persistent asthma, uncomplicated: Secondary | ICD-10-CM

## 2019-06-01 NOTE — Telephone Encounter (Signed)
[facility]er. Pt has not been in a while and has stated multiple times she was out of town and would call back to make an appt. Pt has not made any attempts to call back and schedule. [place]

## 2019-10-10 DIAGNOSIS — Z760 Encounter for issue of repeat prescription: Secondary | ICD-10-CM | POA: Diagnosis not present

## 2019-10-10 DIAGNOSIS — E039 Hypothyroidism, unspecified: Secondary | ICD-10-CM | POA: Diagnosis not present

## 2019-10-10 DIAGNOSIS — J4530 Mild persistent asthma, uncomplicated: Secondary | ICD-10-CM | POA: Diagnosis not present

## 2020-07-11 ENCOUNTER — Encounter: Payer: Self-pay | Admitting: Family Medicine

## 2020-11-06 ENCOUNTER — Encounter: Payer: Self-pay | Admitting: Family Medicine

## 2021-05-25 DIAGNOSIS — I358 Other nonrheumatic aortic valve disorders: Secondary | ICD-10-CM

## 2021-05-25 DIAGNOSIS — T826XXA Infection and inflammatory reaction due to cardiac valve prosthesis, initial encounter: Secondary | ICD-10-CM

## 2021-05-25 HISTORY — DX: Infection and inflammatory reaction due to cardiac valve prosthesis, initial encounter: T82.6XXA

## 2021-05-25 HISTORY — DX: Other nonrheumatic aortic valve disorders: I35.8

## 2021-05-28 ENCOUNTER — Inpatient Hospital Stay (HOSPITAL_COMMUNITY)
Admission: EM | Admit: 2021-05-28 | Discharge: 2021-06-05 | DRG: 314 | Disposition: A | Discharge status: Home Health | Payer: PPO | Attending: Internal Medicine | Admitting: Internal Medicine

## 2021-05-28 ENCOUNTER — Emergency Department (HOSPITAL_COMMUNITY): Payer: PPO

## 2021-05-28 ENCOUNTER — Encounter (HOSPITAL_COMMUNITY): Payer: Self-pay

## 2021-05-28 ENCOUNTER — Other Ambulatory Visit: Payer: Self-pay

## 2021-05-28 DIAGNOSIS — R000 Tachycardia, unspecified: Secondary | ICD-10-CM | POA: Diagnosis not present

## 2021-05-28 DIAGNOSIS — N179 Acute kidney failure, unspecified: Secondary | ICD-10-CM | POA: Diagnosis present

## 2021-05-28 DIAGNOSIS — Z20822 Contact with and (suspected) exposure to covid-19: Secondary | ICD-10-CM | POA: Diagnosis present

## 2021-05-28 DIAGNOSIS — M545 Low back pain, unspecified: Secondary | ICD-10-CM | POA: Diagnosis not present

## 2021-05-28 DIAGNOSIS — R6520 Severe sepsis without septic shock: Secondary | ICD-10-CM | POA: Diagnosis present

## 2021-05-28 DIAGNOSIS — I5022 Chronic systolic (congestive) heart failure: Secondary | ICD-10-CM | POA: Diagnosis present

## 2021-05-28 DIAGNOSIS — G9341 Metabolic encephalopathy: Secondary | ICD-10-CM | POA: Diagnosis present

## 2021-05-28 DIAGNOSIS — L271 Localized skin eruption due to drugs and medicaments taken internally: Secondary | ICD-10-CM | POA: Diagnosis not present

## 2021-05-28 DIAGNOSIS — Y92239 Unspecified place in hospital as the place of occurrence of the external cause: Secondary | ICD-10-CM | POA: Diagnosis not present

## 2021-05-28 DIAGNOSIS — I517 Cardiomegaly: Secondary | ICD-10-CM | POA: Diagnosis not present

## 2021-05-28 DIAGNOSIS — I352 Nonrheumatic aortic (valve) stenosis with insufficiency: Secondary | ICD-10-CM | POA: Diagnosis present

## 2021-05-28 DIAGNOSIS — D649 Anemia, unspecified: Secondary | ICD-10-CM | POA: Diagnosis present

## 2021-05-28 DIAGNOSIS — Z7982 Long term (current) use of aspirin: Secondary | ICD-10-CM

## 2021-05-28 DIAGNOSIS — I340 Nonrheumatic mitral (valve) insufficiency: Secondary | ICD-10-CM | POA: Diagnosis not present

## 2021-05-28 DIAGNOSIS — N3289 Other specified disorders of bladder: Secondary | ICD-10-CM | POA: Diagnosis not present

## 2021-05-28 DIAGNOSIS — J441 Chronic obstructive pulmonary disease with (acute) exacerbation: Secondary | ICD-10-CM

## 2021-05-28 DIAGNOSIS — Z79890 Hormone replacement therapy (postmenopausal): Secondary | ICD-10-CM

## 2021-05-28 DIAGNOSIS — Y838 Other surgical procedures as the cause of abnormal reaction of the patient, or of later complication, without mention of misadventure at the time of the procedure: Secondary | ICD-10-CM | POA: Diagnosis present

## 2021-05-28 DIAGNOSIS — Z952 Presence of prosthetic heart valve: Secondary | ICD-10-CM

## 2021-05-28 DIAGNOSIS — I7121 Aneurysm of the ascending aorta, without rupture: Secondary | ICD-10-CM | POA: Diagnosis not present

## 2021-05-28 DIAGNOSIS — I272 Pulmonary hypertension, unspecified: Secondary | ICD-10-CM | POA: Diagnosis present

## 2021-05-28 DIAGNOSIS — E876 Hypokalemia: Secondary | ICD-10-CM | POA: Diagnosis present

## 2021-05-28 DIAGNOSIS — I712 Thoracic aortic aneurysm, without rupture, unspecified: Secondary | ICD-10-CM | POA: Diagnosis not present

## 2021-05-28 DIAGNOSIS — J449 Chronic obstructive pulmonary disease, unspecified: Secondary | ICD-10-CM | POA: Diagnosis present

## 2021-05-28 DIAGNOSIS — A409 Streptococcal sepsis, unspecified: Secondary | ICD-10-CM | POA: Diagnosis present

## 2021-05-28 DIAGNOSIS — J9811 Atelectasis: Secondary | ICD-10-CM | POA: Diagnosis not present

## 2021-05-28 DIAGNOSIS — R6510 Systemic inflammatory response syndrome (SIRS) of non-infectious origin without acute organ dysfunction: Secondary | ICD-10-CM

## 2021-05-28 DIAGNOSIS — E785 Hyperlipidemia, unspecified: Secondary | ICD-10-CM | POA: Diagnosis present

## 2021-05-28 DIAGNOSIS — Z833 Family history of diabetes mellitus: Secondary | ICD-10-CM

## 2021-05-28 DIAGNOSIS — T826XXA Infection and inflammatory reaction due to cardiac valve prosthesis, initial encounter: Secondary | ICD-10-CM | POA: Diagnosis present

## 2021-05-28 DIAGNOSIS — Z953 Presence of xenogenic heart valve: Secondary | ICD-10-CM | POA: Diagnosis not present

## 2021-05-28 DIAGNOSIS — I358 Other nonrheumatic aortic valve disorders: Secondary | ICD-10-CM | POA: Diagnosis not present

## 2021-05-28 DIAGNOSIS — Z6841 Body Mass Index (BMI) 40.0 and over, adult: Secondary | ICD-10-CM

## 2021-05-28 DIAGNOSIS — Z87891 Personal history of nicotine dependence: Secondary | ICD-10-CM | POA: Diagnosis not present

## 2021-05-28 DIAGNOSIS — I350 Nonrheumatic aortic (valve) stenosis: Secondary | ICD-10-CM | POA: Diagnosis not present

## 2021-05-28 DIAGNOSIS — K5730 Diverticulosis of large intestine without perforation or abscess without bleeding: Secondary | ICD-10-CM | POA: Diagnosis not present

## 2021-05-28 DIAGNOSIS — A419 Sepsis, unspecified organism: Secondary | ICD-10-CM | POA: Diagnosis not present

## 2021-05-28 DIAGNOSIS — J9601 Acute respiratory failure with hypoxia: Secondary | ICD-10-CM | POA: Diagnosis present

## 2021-05-28 DIAGNOSIS — B955 Unspecified streptococcus as the cause of diseases classified elsewhere: Secondary | ICD-10-CM | POA: Diagnosis not present

## 2021-05-28 DIAGNOSIS — R21 Rash and other nonspecific skin eruption: Secondary | ICD-10-CM | POA: Diagnosis not present

## 2021-05-28 DIAGNOSIS — F1010 Alcohol abuse, uncomplicated: Secondary | ICD-10-CM | POA: Diagnosis present

## 2021-05-28 DIAGNOSIS — I38 Endocarditis, valve unspecified: Secondary | ICD-10-CM | POA: Diagnosis not present

## 2021-05-28 DIAGNOSIS — T361X5A Adverse effect of cephalosporins and other beta-lactam antibiotics, initial encounter: Secondary | ICD-10-CM | POA: Diagnosis not present

## 2021-05-28 DIAGNOSIS — E039 Hypothyroidism, unspecified: Secondary | ICD-10-CM | POA: Diagnosis present

## 2021-05-28 DIAGNOSIS — I080 Rheumatic disorders of both mitral and aortic valves: Secondary | ICD-10-CM | POA: Diagnosis not present

## 2021-05-28 DIAGNOSIS — E1165 Type 2 diabetes mellitus with hyperglycemia: Secondary | ICD-10-CM | POA: Diagnosis present

## 2021-05-28 DIAGNOSIS — K8020 Calculus of gallbladder without cholecystitis without obstruction: Secondary | ICD-10-CM | POA: Diagnosis not present

## 2021-05-28 DIAGNOSIS — R7881 Bacteremia: Secondary | ICD-10-CM | POA: Diagnosis not present

## 2021-05-28 DIAGNOSIS — R0602 Shortness of breath: Secondary | ICD-10-CM | POA: Diagnosis not present

## 2021-05-28 DIAGNOSIS — Z79899 Other long term (current) drug therapy: Secondary | ICD-10-CM

## 2021-05-28 DIAGNOSIS — J4540 Moderate persistent asthma, uncomplicated: Secondary | ICD-10-CM

## 2021-05-28 DIAGNOSIS — Z888 Allergy status to other drugs, medicaments and biological substances status: Secondary | ICD-10-CM

## 2021-05-28 DIAGNOSIS — I330 Acute and subacute infective endocarditis: Secondary | ICD-10-CM | POA: Diagnosis present

## 2021-05-28 DIAGNOSIS — R509 Fever, unspecified: Secondary | ICD-10-CM | POA: Diagnosis not present

## 2021-05-28 DIAGNOSIS — I452 Bifascicular block: Secondary | ICD-10-CM | POA: Diagnosis present

## 2021-05-28 DIAGNOSIS — Z9114 Patient's other noncompliance with medication regimen: Secondary | ICD-10-CM

## 2021-05-28 DIAGNOSIS — R319 Hematuria, unspecified: Secondary | ICD-10-CM | POA: Diagnosis present

## 2021-05-28 DIAGNOSIS — R946 Abnormal results of thyroid function studies: Secondary | ICD-10-CM | POA: Diagnosis present

## 2021-05-28 LAB — COMPREHENSIVE METABOLIC PANEL
ALT: 19 U/L (ref 0–44)
AST: 26 U/L (ref 15–41)
Albumin: 3.8 g/dL (ref 3.5–5.0)
Alkaline Phosphatase: 95 U/L (ref 38–126)
Anion gap: 12 (ref 5–15)
BUN: 17 mg/dL (ref 8–23)
CO2: 24 mmol/L (ref 22–32)
Calcium: 8.7 mg/dL — ABNORMAL LOW (ref 8.9–10.3)
Chloride: 100 mmol/L (ref 98–111)
Creatinine, Ser: 1.29 mg/dL — ABNORMAL HIGH (ref 0.44–1.00)
GFR, Estimated: 45 mL/min — ABNORMAL LOW (ref >60)
Glucose, Bld: 170 mg/dL — ABNORMAL HIGH (ref 70–99)
Potassium: 3.4 mmol/L — ABNORMAL LOW (ref 3.5–5.1)
Sodium: 136 mmol/L (ref 135–145)
Total Bilirubin: 1.4 mg/dL — ABNORMAL HIGH (ref 0.3–1.2)
Total Protein: 7.3 g/dL (ref 6.5–8.1)

## 2021-05-28 LAB — CBC WITH DIFFERENTIAL/PLATELET
Abs Immature Granulocytes: 0.62 10*3/uL — ABNORMAL HIGH (ref 0.00–0.07)
Basophils Absolute: 0.2 10*3/uL — ABNORMAL HIGH (ref 0.0–0.1)
Basophils Relative: 1 %
Eosinophils Absolute: 0.1 10*3/uL (ref 0.0–0.5)
Eosinophils Relative: 0 %
HCT: 39.8 % (ref 36.0–46.0)
Hemoglobin: 13.3 g/dL (ref 12.0–15.0)
Immature Granulocytes: 2 %
Lymphocytes Relative: 2 %
Lymphs Abs: 0.8 10*3/uL (ref 0.7–4.0)
MCH: 32.8 pg (ref 26.0–34.0)
MCHC: 33.4 g/dL (ref 30.0–36.0)
MCV: 98 fL (ref 80.0–100.0)
Monocytes Absolute: 2.1 10*3/uL — ABNORMAL HIGH (ref 0.1–1.0)
Monocytes Relative: 6 %
Neutro Abs: 29.9 10*3/uL — ABNORMAL HIGH (ref 1.7–7.7)
Neutrophils Relative %: 89 %
Platelets: 288 10*3/uL (ref 150–400)
RBC: 4.06 MIL/uL (ref 3.87–5.11)
RDW: 12.4 % (ref 11.5–15.5)
WBC: 33.6 10*3/uL — ABNORMAL HIGH (ref 4.0–10.5)
nRBC: 0 % (ref 0.0–0.2)

## 2021-05-28 LAB — URINALYSIS, ROUTINE W REFLEX MICROSCOPIC
Bacteria, UA: NONE SEEN
Bilirubin Urine: NEGATIVE
Glucose, UA: NEGATIVE mg/dL
Ketones, ur: 5 mg/dL — AB
Leukocytes,Ua: NEGATIVE
Nitrite: NEGATIVE
Protein, ur: NEGATIVE mg/dL
Specific Gravity, Urine: 1.016 (ref 1.005–1.030)
pH: 5 (ref 5.0–8.0)

## 2021-05-28 LAB — CK: Total CK: 62 U/L (ref 38–234)

## 2021-05-28 LAB — RAPID URINE DRUG SCREEN, HOSP PERFORMED
Amphetamines: NOT DETECTED
Barbiturates: NOT DETECTED
Benzodiazepines: NOT DETECTED
Cocaine: NOT DETECTED
Opiates: NOT DETECTED
Tetrahydrocannabinol: NOT DETECTED

## 2021-05-28 LAB — LACTIC ACID, PLASMA: Lactic Acid, Venous: 1.6 mmol/L (ref 0.5–1.9)

## 2021-05-28 LAB — ETHANOL: Alcohol, Ethyl (B): <10 mg/dL (ref <10)

## 2021-05-28 LAB — RESP PANEL BY RT-PCR (FLU A&B, COVID) ARPGX2
Influenza A by PCR: NEGATIVE
Influenza B by PCR: NEGATIVE
SARS Coronavirus 2 by RT PCR: NEGATIVE

## 2021-05-28 LAB — PROTIME-INR
INR: 1.1 (ref 0.8–1.2)
Prothrombin Time: 14.3 seconds (ref 11.4–15.2)

## 2021-05-28 LAB — APTT: aPTT: 41 seconds — ABNORMAL HIGH (ref 24–36)

## 2021-05-28 LAB — MAGNESIUM: Magnesium: 1.6 mg/dL — ABNORMAL LOW (ref 1.7–2.4)

## 2021-05-28 LAB — CREATININE, URINE, RANDOM: Creatinine, Urine: 73.44 mg/dL

## 2021-05-28 LAB — SODIUM, URINE, RANDOM: Sodium, Ur: 18 mmol/L

## 2021-05-28 MED ORDER — SODIUM CHLORIDE 0.9 % IV BOLUS (SEPSIS)
1000.0000 mL | Freq: Once | INTRAVENOUS | Status: AC
  Administered 2021-05-28: 1000 mL via INTRAVENOUS

## 2021-05-28 MED ORDER — CEFTRIAXONE SODIUM 2 G IJ SOLR
2.0000 g | INTRAMUSCULAR | Status: DC
Start: 2021-05-28 — End: 2021-05-29
  Administered 2021-05-28: 2 g via INTRAVENOUS
  Filled 2021-05-28: qty 20

## 2021-05-28 MED ORDER — LACTATED RINGERS IV SOLN: INTRAVENOUS | Status: DC

## 2021-05-28 MED ORDER — IOHEXOL 350 MG/ML SOLN
80.0000 mL | Freq: Once | INTRAVENOUS | Status: AC | PRN
  Administered 2021-05-28: 80 mL via INTRAVENOUS

## 2021-05-28 MED ORDER — IPRATROPIUM BROMIDE HFA 17 MCG/ACT IN AERS
2.0000 | INHALATION_SPRAY | Freq: Once | RESPIRATORY_TRACT | Status: AC
  Administered 2021-05-28: 2 via RESPIRATORY_TRACT
  Filled 2021-05-28: qty 12.9

## 2021-05-28 MED ORDER — SODIUM CHLORIDE 0.9 % IV SOLN
500.0000 mg | INTRAVENOUS | Status: DC
  Administered 2021-05-28: 500 mg via INTRAVENOUS
  Filled 2021-05-28 (×2): qty 500

## 2021-05-28 MED ORDER — LORAZEPAM 1 MG PO TABS: 0.0000 mg | ORAL_TABLET | Freq: Two times a day (BID) | ORAL | Status: AC

## 2021-05-28 MED ORDER — LORAZEPAM 1 MG PO TABS
0.0000 mg | ORAL_TABLET | Freq: Four times a day (QID) | ORAL | Status: DC
  Administered 2021-05-29 – 2021-05-30 (×4): 1 mg via ORAL
  Filled 2021-05-28: qty 1
  Filled 2021-05-28: qty 2
  Filled 2021-05-28 (×2): qty 1

## 2021-05-28 MED ORDER — THIAMINE HCL 100 MG PO TABS
100.0000 mg | ORAL_TABLET | Freq: Every day | ORAL | Status: DC
  Administered 2021-05-29 – 2021-06-02 (×4): 100 mg via ORAL
  Filled 2021-05-28 (×4): qty 1

## 2021-05-28 MED ORDER — LORAZEPAM 2 MG/ML IJ SOLN
0.0000 mg | Freq: Two times a day (BID) | INTRAMUSCULAR | Status: AC
  Administered 2021-05-31: 2 mg via INTRAVENOUS
  Filled 2021-05-28: qty 1

## 2021-05-28 MED ORDER — ALBUTEROL SULFATE HFA 108 (90 BASE) MCG/ACT IN AERS
6.0000 | INHALATION_SPRAY | Freq: Once | RESPIRATORY_TRACT | Status: AC
  Administered 2021-05-28: 6 via RESPIRATORY_TRACT
  Filled 2021-05-28: qty 6.7

## 2021-05-28 MED ORDER — ACETAMINOPHEN 500 MG PO TABS
1000.0000 mg | ORAL_TABLET | Freq: Once | ORAL | Status: AC
  Administered 2021-05-28: 1000 mg via ORAL
  Filled 2021-05-28: qty 2

## 2021-05-28 MED ORDER — LORAZEPAM 2 MG/ML IJ SOLN: 0.0000 mg | Freq: Four times a day (QID) | INTRAMUSCULAR | Status: DC

## 2021-05-28 MED ORDER — LORAZEPAM 2 MG/ML IJ SOLN
1.0000 mg | Freq: Once | INTRAMUSCULAR | Status: AC
  Administered 2021-05-28: 1 mg via INTRAVENOUS
  Filled 2021-05-28: qty 1

## 2021-05-28 MED ORDER — METHYLPREDNISOLONE SODIUM SUCC 125 MG IJ SOLR
125.0000 mg | Freq: Once | INTRAMUSCULAR | Status: AC
  Administered 2021-05-28: 125 mg via INTRAVENOUS
  Filled 2021-05-28: qty 2

## 2021-05-28 MED ORDER — THIAMINE HCL 100 MG/ML IJ SOLN: 100.0000 mg | Freq: Every day | INTRAMUSCULAR | Status: DC

## 2021-05-28 NOTE — H&P (Signed)
We will   Cathy Moody FKC:[id number] DOB: 05/31/1951 DOA: 05/28/2021     PCP: Denita Lung, MD   Outpatient Specialists:   NONE    Patient arrived to ER on 05/28/21 at 1909 Referred by Attending Lennice Sites, DO   Patient coming from: home Lives alone,        Chief Complaint:  Chief Complaint  Patient presents with   Shortness of Breath   Fever    HPI: Cathy Moody is a 70 y.o. female with medical history significant of COPD, alcohol abuse Pulmonary hypertension aortic valve replacement status post TAVR  Presented with   confusion  son brought her in because he spoke with her on the phone and she was acting confused. Reports csome chills Her sister in law was sick  Last etoh 2 days Reports drinks 6 beers or so But last week only 3 beers No DT Has not been compliant with home meds Reports mild left lower back pain   No tobacco  Has  been vaccinated against COVID     Initial COVID TEST  NEGATIVE   Lab Results  Component Value Date   [place] NEGATIVE 05/28/2021     Regarding pertinent Chronic problems:     Hyperlipidemia - not on statins Lipid Panel     Component Value Date/Time   CHOL 228 (H) 08/10/2017 1556   TRIG 71 08/10/2017 1556   HDL 70 08/10/2017 1556   CHOLHDL 3.3 08/10/2017 1556   VLDL 25 02/28/2016 1449   LDLCALC 141 (H) 08/10/2017 1556       chronic CHF diastolic - last echo 7494  (grade 1 diastolic dysfunction).  Not taking Lasix     Hypothyroidism:  Lab Results  Component Value Date   TSH 2.16 08/10/2017   on synthroid   Morbid obesity-   BMI Readings from Last 1 Encounters:  03/23/18 40.47 kg/m      COPD - not  followed by pulmonology    Supposed to be on Elipta    While in ER: Noted to meet septic criteria due to tachycardia fever elevated respirations.  Transiently hypoxic down to 89%     ED Triage Vitals  Enc Vitals Group     BP 05/28/21 1926 113/78     Pulse Rate 05/28/21 1926 (!) 146     Resp  05/28/21 1926 (!) 26     Temp 05/28/21 1926 (!) 102.7 F (39.3 C)     Temp Source 05/28/21 1926 Oral     SpO2 05/28/21 1926 (!) 89 %     Weight --      Height --      Head Circumference --      Peak Flow --      Pain Score 05/28/21 1942 0     Pain Loc --      Pain Edu? --      Excl. in [place]? --   TMAX(24)@     _________________________________________ Significant initial  Findings: Abnormal Labs Reviewed  COMPREHENSIVE METABOLIC PANEL - Abnormal; Notable for the following components:      Result Value   Potassium 3.4 (*)    Glucose, Bld 170 (*)    Creatinine, Ser 1.29 (*)    Calcium 8.7 (*)    Total Bilirubin 1.4 (*)    GFR, Estimated 45 (*)    All other components within normal limits  CBC WITH DIFFERENTIAL/PLATELET - Abnormal; Notable for the following components:   WBC 33.6 (*)  Neutro Abs 29.9 (*)    Monocytes Absolute 2.1 (*)    Basophils Absolute 0.2 (*)    Abs Immature Granulocytes 0.62 (*)    All other components within normal limits  APTT - Abnormal; Notable for the following components:   aPTT 41 (*)    All other components within normal limits   ____________________________________________ Ordered    CXR -  NON acute atelectaisis   CTA chest - no PE,  no evidence of infiltrate  Unchanged 4.3 cm ascending thoracic aortic aneurysm.    ECG: Ordered Personally reviewed by me showing: HR : 112 Rhythm:  Sinus tachycardia * A.fib. W RVR, RBBB, LBBB, Paced Ischemic changes*nonspecific changes, no evidence of ischemic changes QTC 444 ____________________ This patient meets SIRS Criteria and may be septic.     The recent clinical data is shown below. Vitals:   05/28/21 2100 05/28/21 2200 05/28/21 2224 05/28/21 2230  BP: 100/67 93/65  (!) 99/52  Pulse: (!) 117 (!) 112  (!) 106  Resp: (!) 23 20  (!) 21  Temp:   99 F (37.2 C)   TempSrc:   Oral   SpO2: 94% 92%  93%    WBC     Component Value Date/Time   WBC 33.6 (H) 05/28/2021 1941   LYMPHSABS 0.8  05/28/2021 1941   MONOABS 2.1 (H) 05/28/2021 1941   EOSABS 0.1 05/28/2021 1941   BASOSABS 0.2 (H) 05/28/2021 1941     Lactic Acid, Venous    Component Value Date/Time   LATICACIDVEN 1.6 05/28/2021 1941      Lactic Acid, Venous    Component Value Date/Time   LATICACIDVEN 1.5 05/29/2021 0000     Procalcitonin   Ordered   UA  no evidence of UTI     Urine analysis:    Component Value Date/Time   COLORURINE YELLOW 05/28/2021 2237   APPEARANCEUR CLEAR 05/28/2021 2237   LABSPEC 1.016 05/28/2021 2237   PHURINE 5.0 05/28/2021 2237   GLUCOSEU NEGATIVE 05/28/2021 2237   HGBUR MODERATE (A) 05/28/2021 2237   BILIRUBINUR NEGATIVE 05/28/2021 2237   KETONESUR 5 (A) 05/28/2021 2237   PROTEINUR NEGATIVE 05/28/2021 2237   UROBILINOGEN 0.2 08/09/2013 0009   NITRITE NEGATIVE 05/28/2021 2237   LEUKOCYTESUR NEGATIVE 05/28/2021 2237    Results for orders placed or performed during the hospital encounter of 05/28/21  Resp Panel by RT-PCR (Flu A&B, Covid) Nasopharyngeal Swab     Status: None   Collection Time: 05/28/21  7:41 PM   Specimen: Nasopharyngeal Swab; Nasopharyngeal(NP) swabs in vial transport medium  Result Value Ref Range Status   SARS Coronavirus 2 by RT PCR NEGATIVE NEGATIVE Final    Comment: (NOTE) SARS-CoV-2 target nucleic acids are NOT DETECTED.  The SARS-CoV-2 RNA is generally detectable in upper respiratory specimens during the acute phase of infection. The lowest concentration of SARS-CoV-2 viral copies this assay can detect is 138 copies/mL. A negative result does not preclude SARS-Cov-2 infection and should not be used as the sole basis for treatment or other patient management decisions. A negative result may occur with  improper specimen collection/handling, submission of specimen other than nasopharyngeal swab, presence of viral mutation(s) within the areas targeted by this assay, and inadequate number of viral copies(<138 copies/mL). A negative result must be  combined with clinical observations, patient history, and epidemiological information. The expected result is Negative.  Fact Sheet for Patients:  EntrepreneurPulse.com.au  Fact Sheet for Healthcare Providers:  IncredibleEmployment.be  This test is no t yet approved  or cleared by the [place] and  has been authorized for detection and/or diagnosis of SARS-CoV-2 by FDA under an Emergency Use Authorization (EUA). This EUA will remain  in effect (meaning this test can be used) for the duration of the COVID-19 declaration under Section 564(b)(1) of the Act, 21 U.S.C.section 360bbb-3(b)(1), unless the authorization is terminated  or revoked sooner.       Influenza A by PCR NEGATIVE NEGATIVE Final   Influenza B by PCR NEGATIVE NEGATIVE Final    Comment: (NOTE) The Xpert Xpress SARS-CoV-2/FLU/RSV plus assay is intended as an aid in the diagnosis of influenza from Nasopharyngeal swab specimens and should not be used as a sole basis for treatment. Nasal washings and aspirates are unacceptable for Xpert Xpress SARS-CoV-2/FLU/RSV testing.  Fact Sheet for Patients: EntrepreneurPulse.com.au  Fact Sheet for Healthcare Providers: IncredibleEmployment.be  This test is not yet approved or cleared by the [place] FDA and has been authorized for detection and/or diagnosis of SARS-CoV-2 by FDA under an Emergency Use Authorization (EUA). This EUA will remain in effect (meaning this test can be used) for the duration of the COVID-19 declaration under Section 564(b)(1) of the Act, 21 U.S.C. section 360bbb-3(b)(1), unless the authorization is terminated or revoked.  Performed at [facility], [place] [address], [place], [place] [address]        _______________________________________________ Hospitalist was called for admission for SIRS  The following Work up has been ordered so  far:  Orders Placed This Encounter  Procedures   Critical Care   Resp Panel by RT-PCR (Flu A&B, Covid) Nasopharyngeal Swab   Blood Culture (routine x 2)   Urine Culture   DG Chest Port 1 View   CT Angio Chest PE W and/or Wo Contrast   Lactic acid, plasma   Comprehensive metabolic panel   CBC WITH DIFFERENTIAL   Protime-INR   APTT   Urinalysis, Routine w reflex microscopic   Ethanol   Urine rapid drug screen (hosp performed)   Diet NPO time specified   Cardiac monitoring   Document height and weight   Assess and Document Glasgow Coma Scale   Document vital signs within 1-hour of fluid bolus completion. Notify provider of abnormal vital signs despite fluid resuscitation.   DO NOT delay antibiotics if unable to obtain blood culture.   Refer to Sidebar Report: Sepsis Sidebar ED/IP   Notify provider for difficulties obtaining IV access.   Insert peripheral IV x 2   Initiate Carrier Fluid Protocol   Code Sepsis activation.  This occurs automatically when order is signed and prioritizes pharmacy, lab, and radiology services for STAT collections and interventions.  If [facility] downtime, call [facility] [phone]) to activate Code Sepsis.   Consult to hospitalist   Pulse oximetry, continuous   ED EKG 12-Lead   EKG 12-Lead   Repeat EKG      Following Medications were ordered in ER: Medications  lactated ringers infusion ( Intravenous New Bag/Given 05/28/21 2047)  cefTRIAXone (ROCEPHIN) 2 g in sodium chloride 0.9 % 100 mL IVPB (0 g Intravenous Stopped 05/28/21 2235)  azithromycin (ZITHROMAX) 500 mg in sodium chloride 0.9 % 250 mL IVPB (0 mg Intravenous Stopped 05/28/21 2235)  sodium chloride 0.9 % bolus 1,000 mL (0 mLs Intravenous Stopped 05/28/21 2224)  acetaminophen (TYLENOL) tablet 1,000 mg (1,000 mg Oral Given 05/28/21 2047)  albuterol (VENTOLIN HFA) 108 (90 Base) MCG/ACT inhaler 6 puff (6 puffs Inhalation Given 05/28/21 2102)  ipratropium (ATROVENT HFA) inhaler 2 puff (2  puffs  Inhalation Given 05/28/21 2102)  LORazepam (ATIVAN) injection 1 mg (1 mg Intravenous Given 05/28/21 2219)  iohexol (OMNIPAQUE) 350 MG/ML injection 80 mL (80 mLs Intravenous Contrast Given 05/28/21 2144)  methylPREDNISolone sodium succinate (SOLU-MEDROL) 125 mg/2 mL injection 125 mg (125 mg Intravenous Given 05/28/21 2233)        Consult Orders  (From admission, onward)           Start     Ordered   05/28/21 2215  Consult to hospitalist  Once       Provider:  (Not yet assigned)  Question Answer Comment  Place call to: [facility]   Reason for Consult Admit      05/28/21 2214              OTHER Significant initial  Findings:  labs showing:    Recent Labs  Lab 05/28/21 1941  NA 136  K 3.4*  CO2 24  GLUCOSE 170*  BUN 17  CREATININE 1.29*  CALCIUM 8.7*    Cr  * stable,  Up from baseline see below Lab Results  Component Value Date   CREATININE 1.29 (H) 05/28/2021   CREATININE 0.84 08/10/2017   CREATININE 0.98 02/28/2016    Recent Labs  Lab 05/28/21 1941  AST 26  ALT 19  ALKPHOS 95  BILITOT 1.4*  PROT 7.3  ALBUMIN 3.8   Lab Results  Component Value Date   CALCIUM 8.7 (L) 05/28/2021          Plt: Lab Results  Component Value Date   PLT 288 05/28/2021       COVID-19 Labs  No results for input(s): DDIMER, FERRITIN, LDH, CRP in the last 72 hours.  Lab Results  Component Value Date   SARSCOV2NAA NEGATIVE 05/28/2021         Recent Labs  Lab 05/28/21 1941  WBC 33.6*  NEUTROABS 29.9*  HGB 13.3  HCT 39.8  MCV 98.0  PLT 288    HG/HCT stable,      Component Value Date/Time   HGB 13.3 05/28/2021 1941   HCT 39.8 05/28/2021 1941   MCV 98.0 05/28/2021 1941      No results for input(s): LIPASE, AMYLASE in the last 168 hours. No results for input(s): AMMONIA in the last 168 hours.   Cardiac Panel (last 3 results) No results for input(s): CKTOTAL, CKMB, TROPONINI, RELINDX in the last 72 hours.   BNP (last 3 results) No  results for input(s): BNP in the last 8760 hours.    DM  labs:  HbA1C: No results for input(s): HGBA1C in the last 8760 hours.     CBG (last 3)  No results for input(s): GLUCAP in the last 72 hours.        Cultures:    Component Value Date/Time   SDES URINE, CLEAN CATCH 08/09/2013 0009   SPECREQUEST NONE 08/09/2013 0009   CULT  08/09/2013 0009    STAPHYLOCOCCUS SPECIES (COAGULASE NEGATIVE) Note: RIFAMPIN AND GENTAMICIN SHOULD NOT BE USED AS SINGLE DRUGS FOR TREATMENT OF STAPH INFECTIONS. LACTOBACILLUS SPECIES Note: Standardized susceptibility testing for this organism is not available. Performed at [place] 08/12/2013 FINAL 08/09/2013 0009     Radiological Exams on Admission: CT Angio Chest PE W and/or Wo Contrast  Result Date: 05/28/2021 CLINICAL DATA:  Fever and shortness of breath. EXAM: CT ANGIOGRAPHY CHEST WITH CONTRAST TECHNIQUE: Multidetector CT imaging of the chest was performed using the standard protocol during bolus administration of intravenous  contrast. Multiplanar CT image reconstructions and MIPs were obtained to evaluate the vascular anatomy. CONTRAST:  43m OMNIPAQUE IOHEXOL 350 MG/ML SOLN COMPARISON:  Chest x-ray from same day. CTA chest dated August 05, 2013. FINDINGS: Cardiovascular: Satisfactory opacification of the pulmonary arteries to the segmental level. No evidence of pulmonary embolism. Chronic cardiomegaly status post TAVR. 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 No pericardial effusion. Motion artifact somewhat limits evaluation but the ascending thoracic aortic aneurysm is grossly unchanged, currently measuring up to 4.3 cm. Coronary, aortic arch, and branch vessel atherosclerotic vascular disease. Mediastinum/Nodes: No enlarged mediastinal, hilar, or axillary lymph nodes. Thyroid gland, trachea, and esophagus demonstrate no significant findings.  Lungs/Pleura: Moderate centrilobular emphysema. Mild subsegmental atelectasis at the lung bases No focal consolidation, pleural effusion, or pneumothorax. Upper Abdomen: No acute abnormality. Musculoskeletal: No chest wall abnormality. No acute or significant osseous findings. Review of the MIP images confirms the above findings. IMPRESSION: 1. No evidence of pulmonary embolism. No acute intrathoracic process. 2. Unchanged 4.3 cm ascending thoracic aortic aneurysm. Recommend annual imaging followup by CTA or MRA. This recommendation follows 2010 ACCF/AHA/AATS/ACR/ASA/SCA/SCAI/SIR/STS/SVM Guidelines for the Diagnosis and Management of Patients with Thoracic Aortic Disease. Circulation. 2010; 121:: [id number] Aortic aneurysm NOS (ICD10-I71.9) 3. 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 4. Aortic Atherosclerosis (ICD10-I70.0) and Emphysema (ICD10-J43.9). Electronically Signed   By: WTitus DubinM.D.   On: 05/28/2021 22:08   DG Chest Port 1 View  Result Date: 05/28/2021 CLINICAL DATA:  Questionable sepsis. EXAM: PORTABLE CHEST 1 VIEW COMPARISON:  Chest x-ray dated September 16, 2013. FINDINGS: Stable cardiomegaly. Normal pulmonary vascularity. Mild bibasilar atelectasis. No focal consolidation, pleural effusion, or pneumothorax. No acute osseous abnormality. IMPRESSION: 1. Mild bibasilar atelectasis. Electronically Signed   By: WTitus DubinM.D.   On: 05/28/2021 20:40   _______________________________________________________________________________________________________ Latest  Blood pressure (!) 99/52, pulse (!) 106, temperature 99 F (37.2 C), temperature source Oral, resp. rate (!) 21, SpO2 93 %.   Review of Systems:    Pertinent positives include:  , Fevers, chills, fatigue, w  Constitutional:  No weight loss, night sweats weight loss  HEENT:  No headaches, Difficulty swallowing,Tooth/dental problems,Sore throat,  No sneezing, itching, ear  ache, nasal congestion, post nasal drip,  Cardio-vascular:  No chest pain, Orthopnea, PND, anasarca, dizziness, palpitations.no Bilateral lower extremity swelling  GI:  No heartburn, indigestion, abdominal pain, nausea, vomiting, diarrhea, change in bowel habits, loss of appetite, melena, blood in stool, hematemesis Resp:  no shortness of breath at rest. No dyspnea on exertion, No excess mucus, no productive cough, No non-productive cough, No coughing up of blood.No change in color of mucus.No wheezing. Skin:  no rash or lesions. No jaundice GU:  no dysuria, change in color of urine, no urgency or frequency. No straining to urinate.  No flank pain.  Musculoskeletal:  No joint pain or no joint swelling. No decreased range of motion. No back pain.  Psych:  No change in mood or affect. No depression or anxiety. No memory loss.  Neuro: no localizing neurological complaints, no tingling, no weakness, no double vision, no gait abnormality, no slurred speech, no confusion  All systems reviewed and apart from [place] are negative _______________________________________________________________________________________________ Past Medical History:   Past Medical History:  Diagnosis Date   Acute CHF ([place]    /Archie Endo12/12/2012   Alcohol abuse    /notes 07/29/2013   Asthma  Heart murmur    Hypotension       Past Surgical History:  Procedure Laterality Date   INTRAOPERATIVE TRANSESOPHAGEAL ECHOCARDIOGRAM N/A 08/09/2013   Procedure: INTRAOPERATIVE TRANSESOPHAGEAL ECHOCARDIOGRAM;  Surgeon: Gaye Pollack, MD;  Location: [facility];  Service: Open Heart Surgery;  Laterality: N/A;   LEFT AND RIGHT HEART CATHETERIZATION WITH CORONARY ANGIOGRAM N/A 08/01/2013   Procedure: LEFT AND RIGHT HEART CATHETERIZATION WITH CORONARY ANGIOGRAM;  Surgeon: Sinclair Grooms, MD;  Location[facility];  Service: Cardiovascular;  Laterality: N/A;   TRANSCATHETER AORTIC VALVE REPLACEMENT, TRANSAPICAL Left 08/09/2013    Procedure: TRANSCATHETER AORTIC VALVE REPLACEMENT, TRANSAPICAL;  Surgeon: Gaye Pollack, MD;  Location: [facility];  Service: Open Heart Surgery;  Laterality: Left;    Social History:  Ambulatory   cain     reports that she quit smoking about 7 years ago. Her smoking use included cigarettes. She has never used smokeless tobacco. She reports current alcohol use. She reports that she does not use drugs.     Family History:   Family History  Problem Relation Age of Onset   Liver disease Son    Diabetes Paternal Grandmother    ______________________________________________________________________________________________ Allergies: Allergies  Allergen Reactions   Plavix [Clopidogrel Bisulfate] Rash     Prior to Admission medications   Medication Sig Start Date End Date Taking? Authorizing Provider  albuterol (VENTOLIN HFA) 108 (90 Base) MCG/ACT inhaler INHALE 2 PUFFS INTO THE LUNGS EVERY 6 HOURS AS NEEDED FOR WHEEZING OR SHORTNESS OF BREATH 05/09/19  Yes Denita Lung, MD  ALPRAZolam Duanne Moron) 0.25 MG tablet Take 1 tablet (0.25 mg total) by mouth 2 (two) times daily as needed for anxiety. Patient not taking: No sig reported 11/07/17   Denita Lung, MD  aspirin EC 81 MG EC tablet Take 1 tablet (81 mg total) by mouth daily. 08/13/13   Barrett, Erin R, PA-C  BREO ELLIPTA 100-25 MCG/INH AEPB TAKE 1 PUFF BY MOUTH EVERY DAY Patient not taking: Reported on 05/28/2021 08/23/18   Denita Lung, MD  furosemide (LASIX) 40 MG tablet Take 0.5-1 tablets (20-40 mg total) by mouth daily. titrating dose as needed Patient not taking: No sig reported 11/08/14   Jettie Booze, MD  levothyroxine (SYNTHROID) 112 MCG tablet TAKE 1 TABLET BY MOUTH EVERY DAY 05/09/19   Denita Lung, MD  potassium chloride SA (K-DUR,KLOR-CON) 20 MEQ tablet Take 1 tablet (20 mEq total) by mouth daily. Patient not taking: No sig reported 11/08/14   Jettie Booze, MD     ___________________________________________________________________________________________________ Physical Exam: Vitals with BMI 05/28/2021 05/28/2021 05/28/2021  Height - - -  Weight - - -  BMI - - -  Systolic 99 93 628  Diastolic 52 65 67  Pulse 366 112 117     1. General:  in No  Acute distress    Chronically ill   -appearing 2. Psychological: Alert and   Oriented 3. Head/ENT:   Dry Mucous Membranes                          Head Non traumatic, neck supple                          Poor Dentition 4. SKIN:  decreased Skin turgor,  Skin clean Dry and intact no rash 5. Heart: Regular rate and rhythm no  Murmur, no Rub or gallop 6. Lungs: occasional wheezes no  crackles   7. Abdomen: Soft,  non-tender, Non distended   obese  bowel sounds present 8. Lower extremities: no clubbing, cyanosis, no  edema 9. Neurologically Grossly intact, moving all 4 extremities equally   10. MSK: Normal range of motion  Left back tenderness over left ileac crest  Chart has been reviewed  ______________________________________________________________________________________________  Assessment/Plan 70 y.o. female with medical history significant of COPD, alcohol abuse    Admitted for SIRS  Present on Admission:  SIRS (systemic inflammatory response syndrome) ([place]) - -SIRS criteria met with  elevated white blood cell count,       Component Value Date/Time   WBC 33.6 (H) 05/28/2021 1941   LYMPHSABS 0.8 05/28/2021 1941      tachycardia   ,   fever   RR >20 Today's Vitals   05/28/21 2200 05/28/21 2224 05/28/21 2230 05/28/21 2236  BP: 93/65  (!) 99/52   Pulse: (!) 112  (!) 106   Resp: 20  (!) 21   Temp:  99 F (37.2 C)    TempSrc:  Oral    SpO2: 92%  93%   PainSc:    0-No pain    -Most likely source being:  viral,   Source of sepsis is unknown but given clinical picture will continue to treat   Patient meeting criteria for Severe sepsis with    evidence of end organ damage/organ  dysfunction such as   acute metabolic encephalopathy    Acute hypoxia requiring new supplemental oxygen, SpO2: 93 %      - Obtain serial lactic acid and procalcitonin level.  - Initiated IV antibiotics in ER: Antibiotics Given (last 72 hours)     Date/Time Action Medication Dose Rate   05/28/21 2046 New Bag/Given   cefTRIAXone (ROCEPHIN) 2 g in sodium chloride 0.9 % 100 mL IVPB 2 g 200 mL/hr   05/28/21 2101 New Bag/Given   azithromycin (ZITHROMAX) 500 mg in sodium chloride 0.9 % 250 mL IVPB 500 mg 250 mL/hr       Will continue  on : cefepime/vanc flagyl for now   - await results of blood and urine culture  - Rehydrate aggressively      12:59 AM   Pulmonary hypertension, moderate to severe (HCC) continue ox    Morbid obesity (HCC) need to follow-up as an outpatient with nutrition   Hypothyroidism - - Check TSH continue home medications at current dose   COPD, severe ([place]) -denies any wheezing or coughing make sure on nebulizer as needed Inhaled steroids CTA negative for PE   AKI (acute kidney injury) (HCC)-not taking Lasix we will gently rehydrate and follow fluid status at the unit  Given hematuria fever and mild back pain will obtain CT renal protocol to rule out kidney stone or obstruction   Acute metabolic encephalopathy -currently improving after rehydration and IV antibiotics.  Continue to monitor likely secondary to infection   Alcohol abuse - CIWA protocol monitor for any sign of withdrawal   Acute respiratory failure with hypoxia (HCC) in the setting of pulmonary hypertension and COPD.  No evidence of PE or pneumonia on chest CT   Hypomagnesemia - will replace Hypokalemia will replace Other plan as per orders.  DVT prophylaxis:   Lovenox      Code Status:    Code Status: Prior FULL CODE as per patient   I had personally discussed CODE STATUS with patient      Family Communication:   Family   at  Bedside  plan of care was discussed   with   Son,     Disposition Plan:   To home once workup is complete and patient is stable   Following barriers for discharge:                            Electrolytes corrected                                                       Afebrile, white count improving able to transition to PO antibiotics                             Will need to be able to tolerate PO                            Will likely need home health, home O2, set up                              Would benefit from PT/OT eval prior to DC  Ordered                                      Nutrition    consulted                   Consults called: none  Admission status:  ED Disposition     ED Disposition  Admit   Condition  --   [place]: [place] [[id number]]  Level of Care: Progressive [102]  Admit to Progressive based on following criteria: MULTISYSTEM THREATS such as stable sepsis, metabolic/electrolyte imbalance with or without encephalopathy that is responding to early treatment.  May place patient in observation at [facility] or [facility] if equivalent level of care is available:: No  Covid Evaluation: Confirmed COVID Negative  Diagnosis: SIRS (systemic inflammatory response syndrome) [facility]) [[id number]]  Admitting Physician: Toy Baker [[id number]]  Attending Physician: Toy Baker [[id number]]           Obs     Level of care        SDU tele indefinitely please discontinue once patient no longer qualifies COVID-19 Labs    Lab Results  Component Value Date   [place] 05/28/2021     Precautions: admitted as   Covid Negative    PPE: Used by the provider:   N95  eye Goggles,  Gloves       Kingstin Heims 05/29/2021, 1:06 AM    [facility]     after 2 AM please page floor coverage PA If 7AM-7PM, please contact the day team taking care of the patient using [email or web address]   Patient was evaluated in the context of the global COVID-19 pandemic, which  necessitated consideration that the patient might be at risk for infection with the SARS-CoV-2 virus that causes COVID-19. Institutional protocols and algorithms that pertain to the evaluation of patients at risk for COVID-19 are in a state of rapid change based on information released by regulatory bodies including the CDC and federal and state  organizations. These policies and algorithms were followed during the patient's care.

## 2021-05-28 NOTE — ED Provider Notes (Signed)
Emergency Medicine Provider Triage Evaluation Note  Cathy Moody , a 70 y.o. female  was evaluated in triage.  Pt complains of feeling sweaty.  History is obtained from patient and her son.  Son states that when he talked with her this morning before he went to work she was normal however when he talked with her after she was having difficulty answering questions and was confused.  She denies any pain.  Son states that she was slightly off balance with walking.  She states she feels short of breath.  Review of Systems  Positive: Fever, confusion, tachycardia, AMS Negative: Syncope  Physical Exam  BP 113/78 (BP Location: Right Arm)   Pulse (!) 146   Temp (!) 102.7 F (39.3 C) (Oral)   Resp (!) 26   SpO2 (!) 89%  Gen:   Awake, appears disheveled, is diaphoretic. Resp:  Increased effort, pursed lip breathing MSK:   Moves extremities without difficulty  Other:  Patient is irritable, tachycardic, febrile, and borderline hypoxic.  Medical Decision Making  Medically screening exam initiated at 7:41 PM.  Appropriate orders placed.  Cathy Moody was informed that the remainder of the evaluation will be completed by another provider, this initial triage assessment does not replace that evaluation, and the importance of remaining in the ED until their evaluation is complete.  Patient meets sepsis criteria.  Sepsis orders are placed.  Triage RN is aware that patient will need room.  Note: Portions of this report may have been transcribed using voice recognition software. Every effort was made to ensure accuracy; however, inadvertent computerized transcription errors may be present    Cristina Gong, PA-C 05/28/21 1944    Virgina Norfolk, DO 05/28/21 2140

## 2021-05-28 NOTE — ED Provider Notes (Signed)
[facility]-EMERGENCY DEPT Provider Note   CSN: [id number] Arrival date & time: 05/28/21  1909     History Chief Complaint  Patient presents with   Shortness of Breath   Fever    Cathy Moody is a 70 y.o. female.  The history is provided by the patient.  Shortness of Breath Severity:  Moderate Onset quality:  Gradual Timing:  Constant Progression:  Unchanged Chronicity:  New Relieved by:  Inhaler Worsened by:  Exertion Associated symptoms: fever   Associated symptoms: no abdominal pain, no chest pain, no cough, no ear pain, no rash, no sore throat, no sputum production and no vomiting   Altered Mental Status Associated symptoms: fever   Associated symptoms: no abdominal pain, no palpitations, no rash, no seizures and no vomiting       Past Medical History:  Diagnosis Date   Acute CHF (HCC)    [name] 07/29/2013   Alcohol abuse    /notes 07/29/2013   Asthma    Heart murmur    Hypotension     Patient Active Problem List   Diagnosis Date Noted   Morbid obesity (HCC) 03/23/2018   Hypothyroidism 03/23/2018   S/P TAVR (transcatheter aortic valve replacement) 08/09/2013   Anxiety state 08/03/2013   Pulmonary hypertension, moderate to severe (HCC) 08/03/2013   COPD, severe (HCC) 07/29/2013   Tobacco abuse, in remission 07/29/2013   Asthma 02/14/2011   Allergic rhinitis 02/14/2011    Past Surgical History:  Procedure Laterality Date   INTRAOPERATIVE TRANSESOPHAGEAL ECHOCARDIOGRAM N/A 08/09/2013   Procedure: INTRAOPERATIVE TRANSESOPHAGEAL ECHOCARDIOGRAM;  Surgeon: Alleen Borne, MD;  Location: [facility] OR;  Service: Open Heart Surgery;  Laterality: N/A;   LEFT AND RIGHT HEART CATHETERIZATION WITH CORONARY ANGIOGRAM N/A 08/01/2013   Procedure: LEFT AND RIGHT HEART CATHETERIZATION WITH CORONARY ANGIOGRAM;  Surgeon: Lesleigh Noe, MD;  Location: [facility] CATH LAB;  Service: Cardiovascular;  Laterality: N/A;   TRANSCATHETER AORTIC VALVE REPLACEMENT,  TRANSAPICAL Left 08/09/2013   Procedure: TRANSCATHETER AORTIC VALVE REPLACEMENT, TRANSAPICAL;  Surgeon: Alleen Borne, MD;  Location: [facility] OR;  Service: Open Heart Surgery;  Laterality: Left;     OB History   No obstetric history on file.     Family History  Problem Relation Age of Onset   Liver disease Son    Diabetes Paternal Grandmother     Social History   Tobacco Use   Smoking status: Former    Types: Cigarettes    Quit date: 07/29/2013    Years since quitting: 7.8   Smokeless tobacco: Never  Vaping Use   Vaping Use: Never used  Substance Use Topics   Alcohol use: Yes    Comment: Pt is weekend drinker   Drug use: No    Home Medications Prior to Admission medications   Medication Sig Start Date End Date Taking? Authorizing Provider  albuterol (VENTOLIN HFA) 108 (90 Base) MCG/ACT inhaler INHALE 2 PUFFS INTO THE LUNGS EVERY 6 HOURS AS NEEDED FOR WHEEZING OR SHORTNESS OF BREATH 05/09/19   [name], MD  ALPRAZolam Prudy Feeler) 0.25 MG tablet Take 1 tablet (0.25 mg total) by mouth 2 (two) times daily as needed for anxiety. 11/07/17   [name], MD  aspirin EC 81 MG EC tablet Take 1 tablet (81 mg total) by mouth daily. 08/13/13   Barrett, Erin R, PA-C  BREO ELLIPTA 100-25 MCG/INH AEPB TAKE 1 PUFF BY MOUTH EVERY DAY 08/23/18   [name], MD  furosemide (LASIX) 40  MG tablet Take 0.5-1 tablets (20-40 mg total) by mouth daily. titrating dose as needed Patient not taking: Reported on 08/10/2017 11/08/14   Corky Crafts, MD  levothyroxine (SYNTHROID) 112 MCG tablet TAKE 1 TABLET BY MOUTH EVERY DAY 05/09/19   [name], MD  potassium chloride SA (K-DUR,KLOR-CON) 20 MEQ tablet Take 1 tablet (20 mEq total) by mouth daily. Patient not taking: Reported on 08/10/2017 11/08/14   Corky Crafts, MD    Allergies    Plavix [clopidogrel bisulfate]  Review of Systems   Review of Systems  Constitutional:  Positive for fever. Negative for chills.  HENT:   Negative for ear pain and sore throat.   Eyes:  Negative for pain and visual disturbance.  Respiratory:  Positive for shortness of breath. Negative for cough and sputum production.   Cardiovascular:  Negative for chest pain and palpitations.  Gastrointestinal:  Negative for abdominal pain and vomiting.  Genitourinary:  Negative for dysuria and hematuria.  Musculoskeletal:  Negative for arthralgias and back pain.  Skin:  Negative for color change and rash.  Neurological:  Negative for seizures and syncope.  All other systems reviewed and are negative.  Physical Exam Updated Vital Signs BP 100/67   Pulse (!) 117   Temp (!) 102.7 F (39.3 C) (Oral)   Resp (!) 23   SpO2 94%   Physical Exam Vitals and nursing note reviewed.  Constitutional:      General: She is not in acute distress.    Appearance: She is well-developed. She is ill-appearing.  HENT:     Head: Normocephalic and atraumatic.     Mouth/Throat:     Mouth: Mucous membranes are moist.  Eyes:     Conjunctiva/sclera: Conjunctivae normal.     Pupils: Pupils are equal, round, and reactive to light.  Cardiovascular:     Rate and Rhythm: Regular rhythm. Tachycardia present.     Heart sounds: No murmur heard. Pulmonary:     Effort: Pulmonary effort is normal. Tachypnea present. No respiratory distress.     Breath sounds: Decreased breath sounds (diminished air movements throughout) present.  Abdominal:     Palpations: Abdomen is soft.     Tenderness: There is no abdominal tenderness.  Musculoskeletal:     Cervical back: Normal range of motion and neck supple.  Skin:    General: Skin is warm and dry.     Capillary Refill: Capillary refill takes less than 2 seconds.  Neurological:     General: No focal deficit present.     Mental Status: She is alert.    ED Results / Procedures / Treatments   Labs (all labs ordered are listed, but only abnormal results are displayed) Labs Reviewed  COMPREHENSIVE METABOLIC PANEL -  Abnormal; Notable for the following components:      Result Value   Potassium 3.4 (*)    Glucose, Bld 170 (*)    Creatinine, Ser 1.29 (*)    Calcium 8.7 (*)    Total Bilirubin 1.4 (*)    GFR, Estimated 45 (*)    All other components within normal limits  CBC WITH DIFFERENTIAL/PLATELET - Abnormal; Notable for the following components:   WBC 33.6 (*)    Neutro Abs 29.9 (*)    Monocytes Absolute 2.1 (*)    Basophils Absolute 0.2 (*)    Abs Immature Granulocytes 0.62 (*)    All other components within normal limits  APTT - Abnormal; Notable for the following components:   aPTT  41 (*)    All other components within normal limits  RESP PANEL BY RT-PCR (FLU A&B, COVID) ARPGX2  CULTURE, BLOOD (ROUTINE X 2)  CULTURE, BLOOD (ROUTINE X 2)  URINE CULTURE  LACTIC ACID, PLASMA  PROTIME-INR  ETHANOL  LACTIC ACID, PLASMA  URINALYSIS, ROUTINE W REFLEX MICROSCOPIC  RAPID URINE DRUG SCREEN, HOSP PERFORMED    EKG EKG Interpretation  Date/Time:  Tuesday May 28 2021 21:29:37 EDT Ventricular Rate:  112 PR Interval:  167 QRS Duration: 113 QT Interval:  325 QTC Calculation: 444 R Axis:   44 Text Interpretation: Sinus tachycardia Borderline intraventricular conduction delay Borderline repolarization abnormality Baseline wander in lead(s) V3 Confirmed by Virgina Norfolk ([id number]) on 05/28/2021 9:37:09 PM  Radiology CT Angio Chest PE W and/or Wo Contrast  Result Date: 05/28/2021 CLINICAL DATA:  Fever and shortness of breath. EXAM: CT ANGIOGRAPHY CHEST WITH CONTRAST TECHNIQUE: Multidetector CT imaging of the chest was performed using the standard protocol during bolus administration of intravenous contrast. Multiplanar CT image reconstructions and MIPs were obtained to evaluate the vascular anatomy. CONTRAST:  60mL OMNIPAQUE IOHEXOL 350 MG/ML SOLN COMPARISON:  Chest x-ray from same day. CTA chest dated August 05, 2013. FINDINGS: Cardiovascular: Satisfactory opacification of the pulmonary arteries to  the segmental level. No evidence of pulmonary embolism. Chronic cardiomegaly status post TAVR. 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 No pericardial effusion. Motion artifact somewhat limits evaluation but the ascending thoracic aortic aneurysm is grossly unchanged, currently measuring up to 4.3 cm. Coronary, aortic arch, and branch vessel atherosclerotic vascular disease. Mediastinum/Nodes: No enlarged mediastinal, hilar, or axillary lymph nodes. Thyroid gland, trachea, and esophagus demonstrate no significant findings. Lungs/Pleura: Moderate centrilobular emphysema. Mild subsegmental atelectasis at the lung bases No focal consolidation, pleural effusion, or pneumothorax. Upper Abdomen: No acute abnormality. Musculoskeletal: No chest wall abnormality. No acute or significant osseous findings. Review of the MIP images confirms the above findings. IMPRESSION: 1. No evidence of pulmonary embolism. No acute intrathoracic process. 2. Unchanged 4.3 cm ascending thoracic aortic aneurysm. Recommend annual imaging followup by CTA or MRA. This recommendation follows 2010 ACCF/AHA/AATS/ACR/ASA/SCA/SCAI/SIR/STS/SVM Guidelines for the Diagnosis and Management of Patients with Thoracic Aortic Disease. Circulation. 2010; 121: J497-W263. Aortic aneurysm NOS (ICD10-I71.9) 3. 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 4. Aortic Atherosclerosis (ICD10-I70.0) and Emphysema (ICD10-J43.9). Electronically Signed   By: Obie Dredge M.D.   On: 05/28/2021 22:08   DG Chest Port 1 View  Result Date: 05/28/2021 CLINICAL DATA:  Questionable sepsis. EXAM: PORTABLE CHEST 1 VIEW COMPARISON:  Chest x-ray dated September 16, 2013. FINDINGS: Stable cardiomegaly. Normal pulmonary vascularity. Mild bibasilar atelectasis. No focal consolidation, pleural effusion, or pneumothorax. No acute osseous abnormality. IMPRESSION:  1. Mild bibasilar atelectasis. Electronically Signed   By: Obie Dredge M.D.   On: 05/28/2021 20:40    Procedures .Critical Care Performed by: Virgina Norfolk, DO Authorized by: Virgina Norfolk, DO   Critical care provider statement:    Critical care time (minutes):  35   Critical care was necessary to treat or prevent imminent or life-threatening deterioration of the following conditions:  Sepsis   Critical care was time spent personally by me on the following activities:  Blood draw for specimens, development of treatment plan with patient or surrogate, discussions with primary provider, evaluation of patient's response to treatment, examination of patient, obtaining history from patient or surrogate, ordering and performing treatments and interventions, ordering and  review of laboratory studies, ordering and review of radiographic studies, pulse oximetry, re-evaluation of patient's condition and review of old charts   I assumed direction of critical care for this patient from another provider in my specialty: no     Care discussed with: admitting provider     Medications Ordered in ED Medications  lactated ringers infusion ( Intravenous New Bag/Given 05/28/21 2047)  cefTRIAXone (ROCEPHIN) 2 g in sodium chloride 0.9 % 100 mL IVPB (2 g Intravenous New Bag/Given 05/28/21 2046)  azithromycin (ZITHROMAX) 500 mg in sodium chloride 0.9 % 250 mL IVPB (500 mg Intravenous New Bag/Given 05/28/21 2101)  LORazepam (ATIVAN) injection 1 mg (has no administration in time range)  methylPREDNISolone sodium succinate (SOLU-MEDROL) 125 mg/2 mL injection 125 mg (has no administration in time range)  sodium chloride 0.9 % bolus 1,000 mL (1,000 mLs Intravenous New Bag/Given 05/28/21 2045)  acetaminophen (TYLENOL) tablet 1,000 mg (1,000 mg Oral Given 05/28/21 2047)  albuterol (VENTOLIN HFA) 108 (90 Base) MCG/ACT inhaler 6 puff (6 puffs Inhalation Given 05/28/21 2102)  ipratropium (ATROVENT HFA) inhaler 2 puff (2 puffs  Inhalation Given 05/28/21 2102)  iohexol (OMNIPAQUE) 350 MG/ML injection 80 mL (80 mLs Intravenous Contrast Given 05/28/21 2144)    ED Course  I have reviewed the triage vital signs and the nursing notes.  Pertinent labs & imaging results that were available during my care of the patient were reviewed by me and considered in my medical decision making (see chart for details).    MDM Rules/Calculators/A&P                           Cathy Moody is here with shortness of breath, fever.  Son states that she was confused but was found to be hypoxic in the 80s upon arrival here.  Tachycardic to the 130s.  May be atrial tachycardia.  Febrile to 102.7.  Blood pressure unremarkable.  She is tachypneic.  Diminished air movement throughout.  Denies any abdominal pain, nausea, vomiting.  Started to feel sweaty and achy today.  Denies any recent illness.  No abdominal tenderness on exam.  Overall she appears ill but mentating well.  Sepsis order set initiated.  Suspect viral/pneumonia process.  She denies any urinary symptoms.  We will start IV antibiotics, fluid bolus.  We will give breathing treatment and Tylenol and anticipate admission for sepsis work-up.  Patient with leukocytosis of 33.  However lactic acid is normal.  Alcohol level undetectable.  Does admit to daily drinking.  Has not had a drink in 2 days.  Will give Ativan as tachycardia could be secondary to some mild withdrawal symptoms.  Feels much better on oxygen.  Getting breathing treatment.  Will give IV steroids.  CT scan of chest and chest x-ray showed no evidence of obvious pneumonia.  No PE.  Lab work is otherwise unremarkable.  Denies any abdominal pain or dysuria.  COVID test is pending.  Overall COPD exacerbation causing hypoxia.  She is febrile and suspect likely viral process.  We will admit her for further care and sepsis rule out.  This chart was dictated using voice recognition software.  Despite best efforts to proofread,  errors  can occur which can change the documentation meaning.   Final Clinical Impression(s) / ED Diagnoses Final diagnoses:  Sepsis, due to unspecified organism, unspecified whether acute organ dysfunction present (HCC)  COPD exacerbation (HCC)    Rx / DC Orders ED Discharge Orders  None        Virgina Norfolk, DO 05/28/21 2216

## 2021-05-28 NOTE — ED Triage Notes (Signed)
Pt's son brought her in because he spoke with her on the phone and she was acting confused. Upon picking pt up, he states that she was having trouble walking. Pt states that she has COPD but feels fine, she is just sweaty.

## 2021-05-29 ENCOUNTER — Observation Stay (HOSPITAL_COMMUNITY): Payer: PPO

## 2021-05-29 DIAGNOSIS — I330 Acute and subacute infective endocarditis: Secondary | ICD-10-CM | POA: Diagnosis present

## 2021-05-29 DIAGNOSIS — E039 Hypothyroidism, unspecified: Secondary | ICD-10-CM | POA: Diagnosis present

## 2021-05-29 DIAGNOSIS — R509 Fever, unspecified: Secondary | ICD-10-CM | POA: Diagnosis not present

## 2021-05-29 DIAGNOSIS — Z952 Presence of prosthetic heart valve: Secondary | ICD-10-CM | POA: Diagnosis not present

## 2021-05-29 DIAGNOSIS — Z6841 Body Mass Index (BMI) 40.0 and over, adult: Secondary | ICD-10-CM | POA: Diagnosis not present

## 2021-05-29 DIAGNOSIS — J9601 Acute respiratory failure with hypoxia: Secondary | ICD-10-CM | POA: Diagnosis present

## 2021-05-29 DIAGNOSIS — I38 Endocarditis, valve unspecified: Secondary | ICD-10-CM | POA: Diagnosis not present

## 2021-05-29 DIAGNOSIS — D649 Anemia, unspecified: Secondary | ICD-10-CM | POA: Diagnosis present

## 2021-05-29 DIAGNOSIS — A409 Streptococcal sepsis, unspecified: Secondary | ICD-10-CM | POA: Diagnosis present

## 2021-05-29 DIAGNOSIS — Y838 Other surgical procedures as the cause of abnormal reaction of the patient, or of later complication, without mention of misadventure at the time of the procedure: Secondary | ICD-10-CM | POA: Diagnosis present

## 2021-05-29 DIAGNOSIS — G9341 Metabolic encephalopathy: Secondary | ICD-10-CM | POA: Diagnosis present

## 2021-05-29 DIAGNOSIS — I350 Nonrheumatic aortic (valve) stenosis: Secondary | ICD-10-CM | POA: Diagnosis not present

## 2021-05-29 DIAGNOSIS — A419 Sepsis, unspecified organism: Secondary | ICD-10-CM | POA: Diagnosis not present

## 2021-05-29 DIAGNOSIS — L271 Localized skin eruption due to drugs and medicaments taken internally: Secondary | ICD-10-CM | POA: Diagnosis not present

## 2021-05-29 DIAGNOSIS — I340 Nonrheumatic mitral (valve) insufficiency: Secondary | ICD-10-CM | POA: Diagnosis not present

## 2021-05-29 DIAGNOSIS — I5022 Chronic systolic (congestive) heart failure: Secondary | ICD-10-CM | POA: Diagnosis present

## 2021-05-29 DIAGNOSIS — M545 Low back pain, unspecified: Secondary | ICD-10-CM | POA: Diagnosis not present

## 2021-05-29 DIAGNOSIS — N179 Acute kidney failure, unspecified: Secondary | ICD-10-CM | POA: Diagnosis present

## 2021-05-29 DIAGNOSIS — Z87891 Personal history of nicotine dependence: Secondary | ICD-10-CM | POA: Diagnosis not present

## 2021-05-29 DIAGNOSIS — R7881 Bacteremia: Secondary | ICD-10-CM | POA: Diagnosis not present

## 2021-05-29 DIAGNOSIS — I272 Pulmonary hypertension, unspecified: Secondary | ICD-10-CM | POA: Diagnosis present

## 2021-05-29 DIAGNOSIS — Z953 Presence of xenogenic heart valve: Secondary | ICD-10-CM | POA: Diagnosis not present

## 2021-05-29 DIAGNOSIS — I358 Other nonrheumatic aortic valve disorders: Secondary | ICD-10-CM | POA: Diagnosis not present

## 2021-05-29 DIAGNOSIS — N3289 Other specified disorders of bladder: Secondary | ICD-10-CM | POA: Diagnosis not present

## 2021-05-29 DIAGNOSIS — R21 Rash and other nonspecific skin eruption: Secondary | ICD-10-CM | POA: Diagnosis not present

## 2021-05-29 DIAGNOSIS — I352 Nonrheumatic aortic (valve) stenosis with insufficiency: Secondary | ICD-10-CM | POA: Diagnosis present

## 2021-05-29 DIAGNOSIS — K8020 Calculus of gallbladder without cholecystitis without obstruction: Secondary | ICD-10-CM | POA: Diagnosis not present

## 2021-05-29 DIAGNOSIS — Y92239 Unspecified place in hospital as the place of occurrence of the external cause: Secondary | ICD-10-CM | POA: Diagnosis not present

## 2021-05-29 DIAGNOSIS — J441 Chronic obstructive pulmonary disease with (acute) exacerbation: Secondary | ICD-10-CM | POA: Diagnosis present

## 2021-05-29 DIAGNOSIS — I452 Bifascicular block: Secondary | ICD-10-CM | POA: Diagnosis present

## 2021-05-29 DIAGNOSIS — F1010 Alcohol abuse, uncomplicated: Secondary | ICD-10-CM | POA: Diagnosis present

## 2021-05-29 DIAGNOSIS — Z20822 Contact with and (suspected) exposure to covid-19: Secondary | ICD-10-CM | POA: Diagnosis present

## 2021-05-29 DIAGNOSIS — K5730 Diverticulosis of large intestine without perforation or abscess without bleeding: Secondary | ICD-10-CM | POA: Diagnosis not present

## 2021-05-29 DIAGNOSIS — Z833 Family history of diabetes mellitus: Secondary | ICD-10-CM | POA: Diagnosis not present

## 2021-05-29 DIAGNOSIS — T826XXA Infection and inflammatory reaction due to cardiac valve prosthesis, initial encounter: Secondary | ICD-10-CM | POA: Diagnosis present

## 2021-05-29 DIAGNOSIS — E1165 Type 2 diabetes mellitus with hyperglycemia: Secondary | ICD-10-CM | POA: Diagnosis present

## 2021-05-29 DIAGNOSIS — J449 Chronic obstructive pulmonary disease, unspecified: Secondary | ICD-10-CM | POA: Diagnosis present

## 2021-05-29 DIAGNOSIS — B955 Unspecified streptococcus as the cause of diseases classified elsewhere: Secondary | ICD-10-CM | POA: Diagnosis not present

## 2021-05-29 DIAGNOSIS — R6520 Severe sepsis without septic shock: Secondary | ICD-10-CM | POA: Diagnosis present

## 2021-05-29 DIAGNOSIS — T361X5A Adverse effect of cephalosporins and other beta-lactam antibiotics, initial encounter: Secondary | ICD-10-CM | POA: Diagnosis not present

## 2021-05-29 LAB — COMPREHENSIVE METABOLIC PANEL
ALT: 19 U/L (ref 0–44)
AST: 25 U/L (ref 15–41)
Albumin: 3.1 g/dL — ABNORMAL LOW (ref 3.5–5.0)
Alkaline Phosphatase: 73 U/L (ref 38–126)
Anion gap: 10 (ref 5–15)
BUN: 14 mg/dL (ref 8–23)
CO2: 26 mmol/L (ref 22–32)
Calcium: 8.7 mg/dL — ABNORMAL LOW (ref 8.9–10.3)
Chloride: 106 mmol/L (ref 98–111)
Creatinine, Ser: 0.92 mg/dL (ref 0.44–1.00)
GFR, Estimated: >60 mL/min (ref >60)
Glucose, Bld: 268 mg/dL — ABNORMAL HIGH (ref 70–99)
Potassium: 4.2 mmol/L (ref 3.5–5.1)
Sodium: 142 mmol/L (ref 135–145)
Total Bilirubin: 0.4 mg/dL (ref 0.3–1.2)
Total Protein: 6.8 g/dL (ref 6.5–8.1)

## 2021-05-29 LAB — HIV ANTIBODY (ROUTINE TESTING W REFLEX): HIV Screen 4th Generation wRfx: NONREACTIVE

## 2021-05-29 LAB — RESPIRATORY PANEL BY PCR

## 2021-05-29 LAB — BLOOD CULTURE ID PANEL (REFLEXED) - BCID2

## 2021-05-29 LAB — ECHOCARDIOGRAM COMPLETE
AR max vel: 0.52 cm2
AV Area VTI: 0.46 cm2
AV Area mean vel: 0.46 cm2
AV Mean grad: 93.2 mmHg
AV Peak grad: 135.7 mmHg
Ao pk vel: 5.82 m/s
Area-P 1/2: 4.15 cm2
Height: 60 in
S' Lateral: 2.8 cm
Weight: 3477.98 oz

## 2021-05-29 LAB — CBC WITH DIFFERENTIAL/PLATELET
Abs Immature Granulocytes: 0.54 10*3/uL — ABNORMAL HIGH (ref 0.00–0.07)
Basophils Absolute: 0.1 10*3/uL (ref 0.0–0.1)
Basophils Relative: 0 %
Eosinophils Absolute: 0 10*3/uL (ref 0.0–0.5)
Eosinophils Relative: 0 %
HCT: 34.5 % — ABNORMAL LOW (ref 36.0–46.0)
Hemoglobin: 11.8 g/dL — ABNORMAL LOW (ref 12.0–15.0)
Immature Granulocytes: 2 %
Lymphocytes Relative: 3 %
Lymphs Abs: 1 10*3/uL (ref 0.7–4.0)
MCH: 33.9 pg (ref 26.0–34.0)
MCHC: 34.2 g/dL (ref 30.0–36.0)
MCV: 99.1 fL (ref 80.0–100.0)
Monocytes Absolute: 0.4 10*3/uL (ref 0.1–1.0)
Monocytes Relative: 1 %
Neutro Abs: 32 10*3/uL — ABNORMAL HIGH (ref 1.7–7.7)
Neutrophils Relative %: 94 %
Platelets: 269 10*3/uL (ref 150–400)
RBC: 3.48 MIL/uL — ABNORMAL LOW (ref 3.87–5.11)
RDW: 12.7 % (ref 11.5–15.5)
WBC: 34 10*3/uL — ABNORMAL HIGH (ref 4.0–10.5)
nRBC: 0 % (ref 0.0–0.2)

## 2021-05-29 LAB — TSH: TSH: 10.76 u[IU]/mL — ABNORMAL HIGH (ref 0.350–4.500)

## 2021-05-29 LAB — GLUCOSE, CAPILLARY
Glucose-Capillary: 214 mg/dL — ABNORMAL HIGH (ref 70–99)
Glucose-Capillary: 299 mg/dL — ABNORMAL HIGH (ref 70–99)

## 2021-05-29 LAB — URINE CULTURE: Culture: NO GROWTH

## 2021-05-29 LAB — AMMONIA: Ammonia: 32 umol/L (ref 9–35)

## 2021-05-29 LAB — MRSA NEXT GEN BY PCR, NASAL: MRSA by PCR Next Gen: NOT DETECTED

## 2021-05-29 LAB — MAGNESIUM: Magnesium: 2.5 mg/dL — ABNORMAL HIGH (ref 1.7–2.4)

## 2021-05-29 LAB — PHOSPHORUS: Phosphorus: 4 mg/dL (ref 2.5–4.6)

## 2021-05-29 LAB — LACTIC ACID, PLASMA: Lactic Acid, Venous: 1.5 mmol/L (ref 0.5–1.9)

## 2021-05-29 MED ORDER — IPRATROPIUM-ALBUTEROL 0.5-2.5 (3) MG/3ML IN SOLN
3.0000 mL | Freq: Three times a day (TID) | RESPIRATORY_TRACT | Status: DC
  Administered 2021-05-30 (×3): 3 mL via RESPIRATORY_TRACT
  Filled 2021-05-29 (×3): qty 3

## 2021-05-29 MED ORDER — VANCOMYCIN HCL IN DEXTROSE 1-5 GM/200ML-% IV SOLN: 1000.0000 mg | INTRAVENOUS | Status: DC

## 2021-05-29 MED ORDER — ACETAMINOPHEN 650 MG RE SUPP: 650.0000 mg | Freq: Four times a day (QID) | RECTAL | Status: DC | PRN

## 2021-05-29 MED ORDER — MAGNESIUM SULFATE IN D5W 1-5 GM/100ML-% IV SOLN
1.0000 g | Freq: Once | INTRAVENOUS | Status: AC
  Administered 2021-05-29: 1 g via INTRAVENOUS
  Filled 2021-05-29: qty 100

## 2021-05-29 MED ORDER — SODIUM CHLORIDE 0.9 % IV SOLN
2.0000 g | Freq: Two times a day (BID) | INTRAVENOUS | Status: DC
  Filled 2021-05-29: qty 2

## 2021-05-29 MED ORDER — SODIUM CHLORIDE 0.9 % IV SOLN
75.0000 mL/h | INTRAVENOUS | Status: DC
  Administered 2021-05-29: 75 mL/h via INTRAVENOUS

## 2021-05-29 MED ORDER — INSULIN ASPART 100 UNIT/ML IJ SOLN
0.0000 [IU] | Freq: Three times a day (TID) | INTRAMUSCULAR | Status: DC
Start: 2021-05-29 — End: 2021-05-30

## 2021-05-29 MED ORDER — ENOXAPARIN SODIUM 40 MG/0.4ML IJ SOSY
40.0000 mg | PREFILLED_SYRINGE | INTRAMUSCULAR | Status: DC
  Administered 2021-05-29: 40 mg via SUBCUTANEOUS
  Filled 2021-05-29 (×5): qty 0.4

## 2021-05-29 MED ORDER — SODIUM CHLORIDE 0.9 % IV SOLN
2.0000 g | Freq: Two times a day (BID) | INTRAVENOUS | Status: DC
  Administered 2021-05-29: 2 g via INTRAVENOUS
  Filled 2021-05-29: qty 2

## 2021-05-29 MED ORDER — SODIUM CHLORIDE 0.9 % IV SOLN
2.0000 g | Freq: Once | INTRAVENOUS | Status: AC
  Administered 2021-05-29: 2 g via INTRAVENOUS
  Filled 2021-05-29: qty 2

## 2021-05-29 MED ORDER — INSULIN ASPART 100 UNIT/ML IJ SOLN
0.0000 [IU] | Freq: Every day | INTRAMUSCULAR | Status: DC
  Administered 2021-05-29: 3 [IU] via SUBCUTANEOUS

## 2021-05-29 MED ORDER — MOMETASONE FURO-FORMOTEROL FUM 200-5 MCG/ACT IN AERO
2.0000 | INHALATION_SPRAY | Freq: Two times a day (BID) | RESPIRATORY_TRACT | Status: DC
  Administered 2021-05-29 – 2021-06-04 (×14): 2 via RESPIRATORY_TRACT
  Filled 2021-05-29: qty 8.8

## 2021-05-29 MED ORDER — SODIUM CHLORIDE 0.9 % IV SOLN
2.0000 g | INTRAVENOUS | Status: DC
  Administered 2021-05-29 – 2021-05-30 (×2): 2 g via INTRAVENOUS
  Filled 2021-05-29 (×3): qty 20

## 2021-05-29 MED ORDER — ALBUTEROL SULFATE (2.5 MG/3ML) 0.083% IN NEBU
2.5000 mg | INHALATION_SOLUTION | RESPIRATORY_TRACT | Status: DC | PRN
  Administered 2021-05-30 – 2021-05-31 (×2): 2.5 mg via RESPIRATORY_TRACT

## 2021-05-29 MED ORDER — HYDROCODONE-ACETAMINOPHEN 5-325 MG PO TABS
1.0000 | ORAL_TABLET | ORAL | Status: DC | PRN
  Administered 2021-05-31: 2 via ORAL
  Filled 2021-05-29: qty 2

## 2021-05-29 MED ORDER — ASPIRIN EC 81 MG PO TBEC
81.0000 mg | DELAYED_RELEASE_TABLET | Freq: Every day | ORAL | Status: DC
  Administered 2021-05-29 – 2021-06-05 (×7): 81 mg via ORAL
  Filled 2021-05-29 (×7): qty 1

## 2021-05-29 MED ORDER — ACETAMINOPHEN 325 MG PO TABS: 650.0000 mg | ORAL_TABLET | Freq: Four times a day (QID) | ORAL | Status: DC | PRN

## 2021-05-29 MED ORDER — LEVOTHYROXINE SODIUM 112 MCG PO TABS
112.0000 ug | ORAL_TABLET | Freq: Every day | ORAL | Status: DC
  Administered 2021-05-29 – 2021-06-05 (×7): 112 ug via ORAL
  Filled 2021-05-29 (×8): qty 1

## 2021-05-29 MED ORDER — METRONIDAZOLE 500 MG/100ML IV SOLN
500.0000 mg | Freq: Two times a day (BID) | INTRAVENOUS | Status: DC
  Administered 2021-05-29 – 2021-05-30 (×3): 500 mg via INTRAVENOUS
  Filled 2021-05-29 (×3): qty 100

## 2021-05-29 MED ORDER — VANCOMYCIN HCL IN DEXTROSE 1-5 GM/200ML-% IV SOLN
1000.0000 mg | Freq: Once | INTRAVENOUS | Status: AC
  Administered 2021-05-29: 1000 mg via INTRAVENOUS
  Filled 2021-05-29: qty 200

## 2021-05-29 MED ORDER — IPRATROPIUM-ALBUTEROL 0.5-2.5 (3) MG/3ML IN SOLN
3.0000 mL | Freq: Four times a day (QID) | RESPIRATORY_TRACT | Status: DC
  Administered 2021-05-29 (×3): 3 mL via RESPIRATORY_TRACT
  Filled 2021-05-29 (×3): qty 3

## 2021-05-29 NOTE — Assessment & Plan Note (Signed)
Presented with tachypnea, tachycardia, fever, and acute metabolic encephalopathy as well as blood pressure less than 100.  No obvious source initially, but blood cultures today returned positive for GPC's, and she appears to have a vegetation on her aortic valve.  - Narrowed to Rocephin given BC ID results - Follow blood culture for sensitivities - Consult cardiology for TEE - May need to consult CT surgery - We will consult ID when we have species

## 2021-05-29 NOTE — Assessment & Plan Note (Addendum)
Magnesium supplemented.  Hypokalemia was supplemented and resolved. - Repeat magnesium

## 2021-05-29 NOTE — Assessment & Plan Note (Signed)
-   Consult cardiology, appreciate cares

## 2021-05-29 NOTE — Progress Notes (Signed)
Nutrition Brief Note  RD consulted to evaluate pt due to obesity and due to COPD exacerbation.   Admitting Dx: COPD exacerbation (HCC) [J44.1] SIRS (systemic inflammatory response syndrome) (HCC) [R65.10] Sepsis, due to unspecified organism, unspecified whether acute organ dysfunction present [facility]) [A41.9] PMH:  Past Medical History:  Diagnosis Date   Acute CHF (HCC)    [name] 07/29/2013   Alcohol abuse    /notes 07/29/2013   Asthma    Heart murmur    Hypotension    Medications:  Scheduled Meds:  aspirin EC  81 mg Oral Daily   enoxaparin (LOVENOX) injection  40 mg Subcutaneous Q24H   ipratropium-albuterol  3 mL Nebulization Q6H   levothyroxine  112 mcg Oral Q0600   LORazepam  0-4 mg Intravenous Q6H   Or   LORazepam  0-4 mg Oral Q6H   [START ON 05/31/2021] LORazepam  0-4 mg Intravenous Q12H   Or   [START ON 05/31/2021] LORazepam  0-4 mg Oral Q12H   mometasone-formoterol  2 puff Inhalation BID   thiamine  100 mg Oral Daily   Or   thiamine  100 mg Intravenous Daily  Continuous Infusions:  ceFEPime (MAXIPIME) IV 2 g (05/29/21 1058)   metronidazole 500 mg (05/29/21 1417)   vancomycin     Labs: Recent Labs  Lab 05/28/21 1941 05/29/21 0639  NA 136 142  K 3.4* 4.2  CL 100 106  CO2 24 26  BUN 17 14  CREATININE 1.29* 0.92  CALCIUM 8.7* 8.7*  MG 1.6* 2.5*  PHOS  --  4.0  GLUCOSE 170* 268*    Wt Readings from Last 15 Encounters:  05/29/21 98.6 kg  03/23/18 97.2 kg  08/10/17 94.3 kg  02/28/16 90.7 kg  11/09/14 93.9 kg  11/08/14 93.9 kg  08/09/14 90.7 kg  05/11/14 92.1 kg  10/27/13 78.9 kg  09/16/13 78 kg  09/16/13 78 kg  09/06/13 78 kg  08/31/13 78 kg  08/13/13 84 kg  07/29/13 85.7 kg  Body mass index is 42.45 kg/m. Patient meets criteria for morbid obesity based on current BMI.   Current diet order is regular, patient is consuming approximately 75-100% of meals at this time.   Obesity is a complex, chronic medical condition that is optimally managed by a  multidisciplinary care team. Weight loss is not an ideal goal for an acute inpatient hospitalization. However, if further work-up for obesity is warranted, consider outpatient referral to outpatient bariatric service and/or [facility].  No nutrition interventions warranted at this time. If nutrition issues arise, please consult RD.   Eugene Gavia, MS, RD, LDN (she/her/hers) RD pager number and weekend/on-call pager number located in Amion.

## 2021-05-29 NOTE — Progress Notes (Signed)
Pharmacy Antibiotic Note  Cathy Moody is a 70 y.o. female admitted on 05/28/2021 with  medical history significant of COPD, alcohol abuse, Pulmonary hypertension aortic valve replacement status post TAVR, presents with confusion.[facility] consulted to dose vancomycin and cefepime for sepsis.  Day #1. Tmax of 102.7. CXr not impressive, CT negative for PE. CT renal shows cholelithiasis without cholecystitis and sigmoid diverticulosis. SCr improving.  Plan: Change Vancomycin 1gm q24h starting tonight Continue cefepime 2gm IV q12h Monitor clinical picture, renal function, vanc levels prn F/U C&S, abx deescalation / LOT  Height: 5' (152.4 cm) Weight: 98.6 kg (217 lb 6 oz) IBW/kg (Calculated) : 45.5  Temp (24hrs), Avg:99.8 F (37.7 C), Min:97.8 F (36.6 C), Max:102.7 F (39.3 C)  Recent Labs  Lab 05/28/21 1941 05/29/21 0000 05/29/21 0639  WBC 33.6*  --  34.0*  CREATININE 1.29*  --  0.92  LATICACIDVEN 1.6 1.5  --      Estimated Creatinine Clearance: 60.8 mL/min (by C-G formula based on SCr of 0.92 mg/dL).    Allergies  Allergen Reactions   Plavix [Clopidogrel Bisulfate] Rash    Antimicrobials this admission: 10/4 azith x 1 10/4 CTX x 1 10/5 flagyl >> 10/5 cefepime >> 10/5 vanc >>  Dose adjustments this admission:   Microbiology results: 10/4 BCx: sent 10/4 UCx:  sent Resp panel negative  Thank you for allowing pharmacy to be a part of this patient's care.  Enzo Bi, PharmD, BCPS, BCIDP Clinical Pharmacist 05/29/2021 9:59 AM

## 2021-05-29 NOTE — Sepsis Progress Note (Signed)
Following for sepsis monitoring ?

## 2021-05-29 NOTE — Progress Notes (Addendum)
Inpatient Diabetes Program Recommendations  AACE/ADA: New Consensus Statement on Inpatient Glycemic Control (2015)  Target Ranges:  Prepandial:   less than 140 mg/dL      Peak postprandial:   less than 180 mg/dL (1-2 hours)      Critically ill patients:  140 - 180 mg/dL   Lab Results  Component Value Date   GLUCAP 101 (H) 08/11/2013   HGBA1C 6.1 (H) 08/10/2017    Review of Glycemic Control  Diabetes history: No hx DM Outpatient Diabetes medications: None Current orders for Inpatient glycemic control: Novolog 0-15 units TID with meals and 0-5 HS  HgbA1C - 6.1% - considered "prediabetes" in 2018. Updated HgbA1C pending. CBGs today: None Novolog orders start at 1800  Inpatient Diabetes Program Recommendations:    F/U with PCP for prediabetes. Lifestyle modification with diet, weight loss and exercise extremely important in keeping HgbA1C < 6.5%, which is diagnosis of Diabetes.  Follow glucose trends.  Thank you. Ailene Ards, RD, LDN, CDE Inpatient Diabetes Coordinator [phone]

## 2021-05-29 NOTE — Assessment & Plan Note (Signed)
Still on oxygen.

## 2021-05-29 NOTE — Progress Notes (Signed)
6754- Dr. Maryfrances Bunnell in to see pt. Verbal orders received and carried out. IVF NS @ 75 discontinued. Telemetry monitor discontinued. Helmut Muster in central telemetry made aware. 02 removed. SP02 96%-100% on room air.  1103- Ativan 1mg  po given per CIWA protocol. Pt's family at bedside. Will continue to monitor.  1200- Seen by OT and they state pt does not meet requirements for OT and will need PT. PT in with pt and ambulated in hallway with SP02 initially down to 91% but maintained @ 96%.  1600- Pt requests that ativan by held at this time while pt's mother and son, are visiting so that she does not fall asleep. Requests that ativan be given at bedtime. Will inform PM nurse.

## 2021-05-29 NOTE — Progress Notes (Signed)
PHARMACY - PHYSICIAN COMMUNICATION CRITICAL VALUE ALERT - BLOOD CULTURE IDENTIFICATION (BCID)  Cathy Moody is an 70 y.o. female who presented to [facility] on 05/28/2021 with a chief complaint of confusion, sepsis. 10/5 Echo:  Sapien prosthetic (TAVR) valve present in the aortic position.  Findings are consistent with stenosis and probable vegetation of the  aortic prosthesis. Recommended a Transesophageal Echocardiogram for clarification.   Assessment:  1/4 bottles (anaerobic bottle) growing GPC in chains.  BCID shows Streptococcus species.  (negative for S.pneumoniae, S.agalactiae, and S.pyogenes)  Name of physician (or Provider) Contacted: Dr Maryfrances Bunnell  Current antibiotics: Vancomycin, Cefepime  Changes to prescribed antibiotics recommended:  Recommendations accepted by provider D/C vancomycin and Cefepime Start Ceftriaxone 2 GM IV q24h  Results for orders placed or performed during the hospital encounter of 05/28/21  Blood Culture ID Panel (Reflexed) (Collected: 05/28/2021  7:46 PM)  Result Value Ref Range   Enterococcus faecalis NOT DETECTED NOT DETECTED   Enterococcus Faecium NOT DETECTED NOT DETECTED   Listeria monocytogenes NOT DETECTED NOT DETECTED   Staphylococcus species NOT DETECTED NOT DETECTED   Staphylococcus aureus (BCID) NOT DETECTED NOT DETECTED   Staphylococcus epidermidis NOT DETECTED NOT DETECTED   Staphylococcus lugdunensis NOT DETECTED NOT DETECTED   Streptococcus species DETECTED (A) NOT DETECTED   Streptococcus agalactiae NOT DETECTED NOT DETECTED   Streptococcus pneumoniae NOT DETECTED NOT DETECTED   Streptococcus pyogenes NOT DETECTED NOT DETECTED   A.calcoaceticus-baumannii NOT DETECTED NOT DETECTED   Bacteroides fragilis NOT DETECTED NOT DETECTED   Enterobacterales NOT DETECTED NOT DETECTED   Enterobacter cloacae complex NOT DETECTED NOT DETECTED   Escherichia coli NOT DETECTED NOT DETECTED   Klebsiella aerogenes NOT DETECTED NOT DETECTED   Klebsiella  oxytoca NOT DETECTED NOT DETECTED   Klebsiella pneumoniae NOT DETECTED NOT DETECTED   Proteus species NOT DETECTED NOT DETECTED   Salmonella species NOT DETECTED NOT DETECTED   Serratia marcescens NOT DETECTED NOT DETECTED   Haemophilus influenzae NOT DETECTED NOT DETECTED   Neisseria meningitidis NOT DETECTED NOT DETECTED   Pseudomonas aeruginosa NOT DETECTED NOT DETECTED   Stenotrophomonas maltophilia NOT DETECTED NOT DETECTED   Candida albicans NOT DETECTED NOT DETECTED   Candida auris NOT DETECTED NOT DETECTED   Candida glabrata NOT DETECTED NOT DETECTED   Candida krusei NOT DETECTED NOT DETECTED   Candida parapsilosis NOT DETECTED NOT DETECTED   Candida tropicalis NOT DETECTED NOT DETECTED   Cryptococcus neoformans/gattii NOT DETECTED NOT DETECTED   Lynann Beaver PharmD, BCPS Clinical Pharmacist [facility] main pharmacy [phone] 05/29/2021 3:17 PM

## 2021-05-29 NOTE — Progress Notes (Signed)
  Echocardiogram 2D Echocardiogram has been performed.  Cathy Moody 05/29/2021, 1:58 PM

## 2021-05-29 NOTE — Hospital Course (Signed)
Mrs. Vitale is a 70 y.o. F with COPD not on O2, EtOH use, pHTN, obesity, and TAVR who presented with fever.  Evidently the patient's son called on the phone, and she was not making sense so he came over and found that she was febrile, with chills and brought her to the ER.  In the ER, CT angiogram of the chest showed no PE or infiltrate, CT abdomen and pelvis was unremarkable.  Urinalysis clean.      10/4 admitted on empiric antibiotics for sepsis unclear source 10/5 blood cultures growing GPC, echo shows severe aortic stenosis and probably vegetation

## 2021-05-29 NOTE — Progress Notes (Signed)
Inpatient Diabetes Program Recommendations  AACE/ADA: New Consensus Statement on Inpatient Glycemic Control (2015)  Target Ranges:  Prepandial:   less than 140 mg/dL      Peak postprandial:   less than 180 mg/dL (1-2 hours)      Critically ill patients:  140 - 180 mg/dL   Lab Results  Component Value Date   GLUCAP 101 (H) 08/11/2013   HGBA1C 6.1 (H) 08/10/2017    Review of Glycemic Control  Diabetes history: None Outpatient Diabetes medications: N/A Current orders for Inpatient glycemic control: None  Received dose of Solumedrol 125 mg on 10/04. Likely steroid-induced hyperglycemia  Inpatient Diabetes Program Recommendations:    Need HgbA1C to assess glycemic control prior to hospitalization. Add Novolog 0-6 TID with meals.  Will await HgbA1C results for diabetes diagnosis.  Thank you. Ailene Ards, RD, LDN, CDE Inpatient Diabetes Coordinator [phone]

## 2021-05-29 NOTE — Assessment & Plan Note (Signed)
Continue levothyroxine 

## 2021-05-29 NOTE — Assessment & Plan Note (Signed)
No significant alcohol withdrawal at this point - CIWA scale with on-demand lorazepam - Continue thiamine and folate

## 2021-05-29 NOTE — Progress Notes (Signed)
OT Cancellation Note  Patient Details Name: Cathy Moody MRN: [id number] DOB: [birth year]   Cancelled Treatment:    Reason Eval/Treat Not Completed: OT screened, no needs identified, will sign off. Patient reports she is independent with ADLs with no OT needs.  Azell Bill L Shavell Nored 05/29/2021, 12:21 PM

## 2021-05-29 NOTE — Plan of Care (Signed)
°  Problem: Education: °Goal: Knowledge of General Education information will improve °Description: Including pain rating scale, medication(s)/side effects and non-pharmacologic comfort measures °Outcome: Progressing °  °Problem: Clinical Measurements: °Goal: Cardiovascular complication will be avoided °Outcome: Progressing °  °Problem: Activity: °Goal: Risk for activity intolerance will decrease °Outcome: Progressing °  °

## 2021-05-29 NOTE — ED Notes (Signed)
Patient transported to CT 

## 2021-05-29 NOTE — Progress Notes (Signed)
Pharmacy Antibiotic Note  Cathy Moody is a 70 y.o. female admitted on 05/28/2021 with  medical history significant of COPD, alcohol abuse, Pulmonary hypertension aortic valve replacement status post TAVR, presents with confusion.Marland Kitchen  Pharmacy has been consulted to dose vancomycin and cefepime for sepsis.  Plan: Vancomycin 1gm IV x 1 then 1gm q36h (AUC 492, Scr 1.29) Cefepime 2gm IV q12h Follow renal function, cultures and clinical course    Temp (24hrs), Avg:100.9 F (38.3 C), Min:99 F (37.2 C), Max:102.7 F (39.3 C)  Recent Labs  Lab 05/28/21 1941 05/29/21 0000  WBC 33.6*  --   CREATININE 1.29*  --   LATICACIDVEN 1.6 1.5    CrCl cannot be calculated (Unknown ideal weight.).    Allergies  Allergen Reactions   Plavix [Clopidogrel Bisulfate] Rash    Antimicrobials this admission: 10/4 azith x 1 10/4 CTX x 1 10/5 flagyl >> 10/5 cefepime >> 10/5 vanc >>  Dose adjustments this admission:   Microbiology results: 10/4 BCx:  10/4 UCx:     Thank you for allowing pharmacy to be a part of this patient's care.  Arley Phenix RPh 05/29/2021, 1:13 AM

## 2021-05-29 NOTE — Assessment & Plan Note (Signed)
Due to sepsis, this appears to be resolved now.  At baseline, she is independent, has no cognitive impairment, last night she was confused.

## 2021-05-29 NOTE — Progress Notes (Signed)
  Progress Note    Cathy Moody   ONG:[id number]  DOB: Jun 23, 1951  DOA: 05/28/2021     0 Date of Service: 05/29/2021    Brief summary: Cathy Moody is a 71 y.o. F with COPD not on O2, EtOH use, pHTN, obesity, and TAVR who presented with fever.  Evidently the patient's son called on the phone, and she was not making sense so he came over and found that she was febrile, with chills and brought her to the ER.  In the ER, CT angiogram of the chest showed no PE or infiltrate, CT abdomen and pelvis was unremarkable.  Urinalysis clean.      10/4 admitted on empiric antibiotics for sepsis unclear source 10/5 blood cultures growing GPC, echo shows severe aortic stenosis and probably vegetation        Subjective:  Feels completely normal.  No orthopnea, leg swelling.  No chest pain with exertion.  No blackouts or dyspnea with exertion.  No fever.  No confusion.  No focal weakness or numbness.  Hospital Problems * Severe sepsis [facility]) Presented with tachypnea, tachycardia, fever, and acute metabolic encephalopathy as well as blood pressure less than 100.  No obvious source initially, but blood cultures today returned positive for GPC's, and she appears to have a vegetation on her aortic valve.  - Narrowed to Rocephin given BC ID results - Follow blood culture for sensitivities - Consult cardiology for TEE - May need to consult CT surgery - We will consult ID when we have species  Acute respiratory failure with hypoxia (HCC) Still on oxygen.  Acute metabolic encephalopathy Due to sepsis, this appears to be resolved now.  At baseline, she is independent, has no cognitive impairment, last night she was confused.  S/P TAVR (transcatheter aortic valve replacement) - Consult cardiology, appreciate cares  Hypomagnesemia Magnesium supplemented.  Hypokalemia was supplemented and resolved. - Repeat magnesium  Alcohol abuse No significant alcohol withdrawal at this point - CIWA scale with  on-demand lorazepam - Continue thiamine and folate  Pulmonary hypertension, moderate to severe (HCC)    Hypothyroidism - Continue levothyroxine  Morbid obesity (HCC) BMI 42 AKI ruled out  COPD, severe (HCC) - Continue  And bronchodilators - Continue Dulera  Hyperglycemia - Check A1c   Objective Tachycardia, hypotension, fever, and tachypnea all have normalized. BP 136/68 (BP Location: Right Arm)   Pulse 86   Temp 98.1 F (36.7 C) (Oral)   Resp 18   Ht 5' (1.524 m)   Wt 98.6 kg   SpO2 92%   BMI 42.45 kg/m       Exam General appearance: Obese adult female, lying bed, no acute distress.     HEENT: Anicteric, conjunctival pink, lids and lashes normal.  No nasal forming, discharge, . Skin: Skin warm and dry, no suspicious rashes or lesions. Cardiac: RRR, soft holosystolic murmur or late peaking murmur, no lower extremity pitting, JVP not visible Respiratory: Normal respiratory rate and rhythm, lungs clear without rales or wheezes Abdomen: Abdomen soft autoactivation guarding noted, no ascites or distention MSK:  Neuro: Awake alert, extraocular movements intact, moves all extremities with normal strength, speech fluent, memory appears normal. Psych: Attention normal, affect normal, judgment insight appear normal     Labs / Other Information My review of labs, imaging, notes and other tests is significant for Normalized K, elevated glucose, leukocytosis.     Time spent: 35 minutes [facility] 05/29/2021, 5:39 PM

## 2021-05-29 NOTE — Assessment & Plan Note (Signed)
BMI 42 AKI ruled out

## 2021-05-29 NOTE — Assessment & Plan Note (Signed)
-   Continue  And bronchodilators - Continue Elwin Sleight

## 2021-05-29 NOTE — Evaluation (Signed)
Physical Therapy One Time Evaluation Patient Details Name: Cathy Moody MRN: [id number] DOB: December 07, 1950 Today's Date: 05/29/2021  History of Present Illness  70 y.o. female with medical history significant of COPD, alcohol abuse,  Pulmonary hypertension, aortic valve replacement status post TAVR and admitted for SIRS.  Clinical Impression  Patient evaluated by Physical Therapy with no further acute PT needs identified. All education has been completed and the patient has no further questions. See below for any follow-up Physical Therapy or equipment needs. PT is signing off. Thank you for this referral.   Pt ambulated in hallway on room air and did not require supplemental oxygen.  Pt preferring to utilize RW at this time for endurance and has RW at home.  Pt also appears to have good family support.         Recommendations for follow up therapy are one component of a multi-disciplinary discharge planning process, led by the attending physician.  Recommendations may be updated based on patient status, additional functional criteria and insurance authorization.  Follow Up Recommendations No PT follow up    Equipment Recommendations  None recommended by PT    Recommendations for Other Services       Precautions / Restrictions Precautions Precautions: None      Mobility  Bed Mobility Overal bed mobility: Modified Independent                  Transfers Overall transfer level: Needs assistance Equipment used: Rolling walker (2 wheeled) Transfers: Sit to/from Stand Sit to Stand: Supervision            Ambulation/Gait Ambulation/Gait assistance: Supervision Gait Distance (Feet): 280 Feet Assistive device: Rolling walker (2 wheeled) Gait Pattern/deviations: Step-through pattern;Decreased stride length;Trunk flexed     General Gait Details: cues for RW positioning however pt tends to keep RW too far forward; no unsteadiness or LOB observed; SpO2 monitored and mostly  92-99% on room air, briefly 89% at lowest and RN notified  Stairs            Wheelchair Mobility    Modified Rankin (Stroke Patients Only)       Balance Overall balance assessment: Mild deficits observed, not formally tested                                           Pertinent Vitals/Pain Pain Assessment: No/denies pain    Home Living Family/patient expects to be discharged to:: Private residence   Available Help at Discharge: Available 24 hours/day;Family Type of Home: House       Home Layout: One level Home Equipment: Environmental consultant - 2 wheels      Prior Function Level of Independence: Independent               Hand Dominance        Extremity/Trunk Assessment        Lower Extremity Assessment Lower Extremity Assessment: Overall WFL for tasks assessed       Communication   Communication: No difficulties  Cognition Arousal/Alertness: Awake/alert Behavior During Therapy: WFL for tasks assessed/performed Overall Cognitive Status: Within Functional Limits for tasks assessed                                        General Comments  Exercises     Assessment/Plan    PT Assessment Patent does not need any further PT services  PT Problem List         PT Treatment Interventions      PT Goals (Current goals can be found in the Care Plan section)  Acute Rehab PT Goals PT Goal Formulation: All assessment and education complete, DC therapy    Frequency     Barriers to discharge        Co-evaluation               AM-PAC PT "6 Clicks" Mobility  Outcome Measure Help needed turning from your back to your side while in a flat bed without using bedrails?: None Help needed moving from lying on your back to sitting on the side of a flat bed without using bedrails?: None Help needed moving to and from a bed to a chair (including a wheelchair)?: None Help needed standing up from a chair using your arms  (e.g., wheelchair or bedside chair)?: None Help needed to walk in hospital room?: None Help needed climbing 3-5 steps with a railing? : A Little 6 Click Score: 23    End of Session   Activity Tolerance: Patient tolerated treatment well Patient left: in bed;with call bell/phone within reach;with family/visitor present Nurse Communication: Mobility status PT Visit Diagnosis: Difficulty in walking, not elsewhere classified (R26.2)    Time: 5597-4163 PT Time Calculation (min) (ACUTE ONLY): 15 min   Charges:   PT Evaluation $PT Eval Low Complexity: 1 Low        Kati PT, DPT Acute Rehabilitation Services Pager: [phone] Office: [phone]   Janan Halter Payson 05/29/2021, 1:03 PM

## 2021-05-30 DIAGNOSIS — I330 Acute and subacute infective endocarditis: Secondary | ICD-10-CM | POA: Diagnosis present

## 2021-05-30 DIAGNOSIS — G9341 Metabolic encephalopathy: Secondary | ICD-10-CM | POA: Diagnosis not present

## 2021-05-30 DIAGNOSIS — N179 Acute kidney failure, unspecified: Secondary | ICD-10-CM | POA: Diagnosis not present

## 2021-05-30 DIAGNOSIS — J9601 Acute respiratory failure with hypoxia: Secondary | ICD-10-CM | POA: Diagnosis not present

## 2021-05-30 DIAGNOSIS — A419 Sepsis, unspecified organism: Secondary | ICD-10-CM | POA: Diagnosis not present

## 2021-05-30 LAB — BASIC METABOLIC PANEL
Anion gap: 6 (ref 5–15)
BUN: 14 mg/dL (ref 8–23)
CO2: 25 mmol/L (ref 22–32)
Calcium: 9.1 mg/dL (ref 8.9–10.3)
Chloride: 113 mmol/L — ABNORMAL HIGH (ref 98–111)
Creatinine, Ser: 0.89 mg/dL (ref 0.44–1.00)
GFR, Estimated: >60 mL/min (ref >60)
Glucose, Bld: 216 mg/dL — ABNORMAL HIGH (ref 70–99)
Potassium: 4.3 mmol/L (ref 3.5–5.1)
Sodium: 144 mmol/L (ref 135–145)

## 2021-05-30 LAB — CBC
HCT: 35.2 % — ABNORMAL LOW (ref 36.0–46.0)
Hemoglobin: 11.8 g/dL — ABNORMAL LOW (ref 12.0–15.0)
MCH: 33.5 pg (ref 26.0–34.0)
MCHC: 33.5 g/dL (ref 30.0–36.0)
MCV: 100 fL (ref 80.0–100.0)
Platelets: 265 10*3/uL (ref 150–400)
RBC: 3.52 MIL/uL — ABNORMAL LOW (ref 3.87–5.11)
RDW: 12.8 % (ref 11.5–15.5)
WBC: 31.4 10*3/uL — ABNORMAL HIGH (ref 4.0–10.5)
nRBC: 0 % (ref 0.0–0.2)

## 2021-05-30 LAB — MAGNESIUM: Magnesium: 2.1 mg/dL (ref 1.7–2.4)

## 2021-05-30 LAB — HEMOGLOBIN A1C
Hgb A1c MFr Bld: 6.3 % — ABNORMAL HIGH (ref 4.8–5.6)
Mean Plasma Glucose: 134.11 mg/dL

## 2021-05-30 LAB — GLUCOSE, CAPILLARY
Glucose-Capillary: 188 mg/dL — ABNORMAL HIGH (ref 70–99)
Glucose-Capillary: 199 mg/dL — ABNORMAL HIGH (ref 70–99)

## 2021-05-30 MED ORDER — IPRATROPIUM-ALBUTEROL 0.5-2.5 (3) MG/3ML IN SOLN: 3.0000 mL | Freq: Four times a day (QID) | RESPIRATORY_TRACT | Status: DC | PRN

## 2021-05-30 MED ORDER — DOXYLAMINE SUCCINATE (SLEEP) 25 MG PO TABS
25.0000 mg | ORAL_TABLET | Freq: Every evening | ORAL | Status: DC | PRN
  Filled 2021-05-30: qty 1

## 2021-05-30 NOTE — H&P (View-Only) (Signed)
Cardiology Consultation:   Patient ID: Cathy Moody MRN: [id number]; DOB: 03-24-1951  Admit date: 05/28/2021 Date of Consult: 05/30/2021  PCP:  [name], MD   [facility] Providers Cardiologist:  Dr. Eldridge Dace in 2014-2015, Dr. Elyn Peers with structural heart team in 2014-2015, Dr. Tenny Craw this admission   Patient Profile:   Cathy Moody is a 70 y.o. female with a hx of AS s/p TAVR in 2014, chronic systolic heart failure, NICM, tobacco abuse, COPD, and morbid obesity who is being seen 05/30/2021 for the evaluation of AV vegetation at the request of Dr. Maryfrances Bunnell.  History of Present Illness:   Cathy Moody established care with [facility] during a hospitalization 2014 found to have severe aortic valve stenosis and LVEF 25%. Heart cath showed no obstructive disease at that time. Given severe COPD, pulmonary HTN, and other comorbidities, she was turned down for SAVR and underwent TAVR 08/09/13. Course was complicated by pleural effusion requiring thoracentesis x 2. She also developed a rash on plavix. She was switched to effient. She was lost to follow up in after 2015. The pt says initially she felt better after TAVR but that after thoracentesis has had some SOB   Confusing picture is known COPD The pt and her son say over the past few wks to month she has had increased SOB with activity   She does not do that much but she would get winded.   Se says though when she goes through the store with a cart she is OK   Ge She presented to [facility] with confusion, SOB, and found to be hypoxic. WBC 33, normal lactic acid. Daily drinker but had not had ETOH in 2 days, confirmed with ethanol level. She was admitted for sepsis workup. Blood cultures growing GPC. Echo obtained and revealed severe AS and probable vegetation on aortic valve prosthesis. Cardiology was consulted.       Past Medical History:  Diagnosis Date   Acute CHF (HCC)    [name] 07/29/2013   Alcohol abuse    /notes 07/29/2013    Asthma    Heart murmur    Hypotension     Past Surgical History:  Procedure Laterality Date   INTRAOPERATIVE TRANSESOPHAGEAL ECHOCARDIOGRAM N/A 08/09/2013   Procedure: INTRAOPERATIVE TRANSESOPHAGEAL ECHOCARDIOGRAM;  Surgeon: Alleen Borne, MD;  Location: [facility] OR;  Service: Open Heart Surgery;  Laterality: N/A;   LEFT AND RIGHT HEART CATHETERIZATION WITH CORONARY ANGIOGRAM N/A 08/01/2013   Procedure: LEFT AND RIGHT HEART CATHETERIZATION WITH CORONARY ANGIOGRAM;  Surgeon: Lesleigh Noe, MD;  Location: [facility] CATH LAB;  Service: Cardiovascular;  Laterality: N/A;   TRANSCATHETER AORTIC VALVE REPLACEMENT, TRANSAPICAL Left 08/09/2013   Procedure: TRANSCATHETER AORTIC VALVE REPLACEMENT, TRANSAPICAL;  Surgeon: Alleen Borne, MD;  Location: [facility] OR;  Service: Open Heart Surgery;  Laterality: Left;       Inpatient Medications: Scheduled Meds:  aspirin EC  81 mg Oral Daily   enoxaparin (LOVENOX) injection  40 mg Subcutaneous Q24H   insulin aspart  0-15 Units Subcutaneous TID WC   insulin aspart  0-5 Units Subcutaneous QHS   ipratropium-albuterol  3 mL Nebulization TID   levothyroxine  112 mcg Oral Q0600   LORazepam  0-4 mg Intravenous Q6H   Or   LORazepam  0-4 mg Oral Q6H   [START ON 05/31/2021] LORazepam  0-4 mg Intravenous Q12H   Or   [START ON 05/31/2021] LORazepam  0-4 mg Oral Q12H   mometasone-formoterol  2 puff Inhalation BID  thiamine  100 mg Oral Daily   Or   thiamine  100 mg Intravenous Daily   Continuous Infusions:  cefTRIAXone (ROCEPHIN)  IV 2 g (05/29/21 1843)   metronidazole 500 mg (05/30/21 0056)   PRN Meds: acetaminophen **OR** acetaminophen, albuterol, HYDROcodone-acetaminophen  Allergies:    Allergies  Allergen Reactions   Plavix [Clopidogrel Bisulfate] Rash    Social History:   Social History   Socioeconomic History   Marital status: Divorced    Spouse name: Not on file   Number of children: Not on file   Years of education: Not on file   Highest  education level: Not on file  Occupational History   Not on file  Tobacco Use   Smoking status: Former    Types: Cigarettes    Quit date: 07/29/2013    Years since quitting: 7.8   Smokeless tobacco: Never  Vaping Use   Vaping Use: Never used  Substance and Sexual Activity   Alcohol use: Yes    Comment: Pt is weekend drinker   Drug use: No   Sexual activity: Not Currently  Other Topics Concern   Not on file  Social History Narrative   Not on file   Social Determinants of Health   Financial Resource Strain: Not on file  Food Insecurity: Not on file  Transportation Needs: Not on file  Physical Activity: Not on file  Stress: Not on file  Social Connections: Not on file  Intimate Partner Violence: Not on file    Family History:    Family History  Problem Relation Age of Onset   Liver disease Son    Diabetes Paternal Grandmother      ROS:  Please see the history of present illness.   All other ROS reviewed and negative.     Physical Exam/Data:   Vitals:   05/29/21 1951 05/29/21 2200 05/30/21 0538 05/30/21 0751  BP:  111/72 (!) 147/82   Pulse:  (!) 108 90   Resp:  18 18   Temp:  97.6 F (36.4 C) 98 F (36.7 C)   TempSrc:  Oral Oral   SpO2: 92% 95% 90% (!) 88%  Weight:      Height:        Intake/Output Summary (Last 24 hours) at 05/30/2021 0832 Last data filed at 05/30/2021 0318 Gross per 24 hour  Intake 1097.45 ml  Output --  Net 1097.45 ml   Last 3 Weights 05/29/2021 05/29/2021 03/23/2018  Weight (lbs) 217 lb 6 oz 198 lb 214 lb 3.2 oz  Weight (kg) 98.6 kg 89.812 kg 97.16 kg     Body mass index is 42.45 kg/m.  General:  Obese 70 yo in no acute distress HEENT: normal Neck: Neck is full   No obvious JVD  no bruits  Vascular: No carotid bruits; Distal pulses 2+ bilaterally Cardiac:  normal S1, S2; RRR; Gr II-III/VI mid/later peaking  systolic murmur at base  Lungs:  Some decreased airflow  No rales  no wheezes   Abd: soft, nontender,  No masses Ext: no  edema Musculoskeletal:  No deformities, BUE and BLE strength normal and equal Skin: warm and dry  Neuro:  CNs 2-12 intact, no focal abnormalities noted Psych:  Normal affect   EKG:  The EKG was personally reviewed and demonstrates:  ST  113  LVH     Telemetry:  Telemetry was personally reviewed and demonstrates:  not on tele  Relevant CV Studies: Echo   05/29/21 Left ventricular ejection fraction,  by estimation, is 60 to 65%. The left ventricle has normal function. The left ventricle has no regional wall motion abnormalities. There is moderate left ventricular hypertrophy. Left ventricular diastolic parameters are consistent with Grade I diastolic dysfunction (impaired relaxation). Elevated left ventricular end-diastolic pressure. The E/e' is 17. 1. Right ventricular systolic function is low normal. The right ventricular size is normal. There is normal pulmonary artery systolic pressure. The estimated right ventricular systolic pressure is 26.2 mmHg. 2. 3. Left atrial size was moderately dilated. 4. Right atrial size was mildly dilated. The mitral valve is abnormal. Trivial mitral valve regurgitation. Moderate mitral annular calcification. 5. Prostetic valve EOA is 0.46 cm2 - the AT is prolonged at 123 msec, jet contour is rounded, DI is 0.15 -features consistent with severe prosthetic valve stenosis. The aortic valve has been repaired/replaced. Aortic valve regurgitation is not visualized. Critical aortic stenosis. There is a 29 mm Sapien prosthetic (TAVR) valve present in the aortic position. Procedure Date: 08/09/13. Echo findings are consistent with stenosis and probable vegetation of the aortic prosthesis. Aortic valve area, by VTI measures 0.46 cm. Aortic valve mean gradient measures 93.2 mmHg. Aortic valve Vmax measures 5.82 m/s. 6. The inferior vena cava is normal in size with greater than 50% respiratory variability, suggesting right atrial pressure of 3  mmHg.  Laboratory Data:  High Sensitivity Troponin:  No results for input(s): TROPONINIHS in the last 720 hours.   Chemistry Recent Labs  Lab 05/28/21 1941 05/29/21 0639 05/30/21 0457  NA 136 142 144  K 3.4* 4.2 4.3  CL 100 106 113*  CO2 24 26 25   GLUCOSE 170* 268* 216*  BUN 17 14 14   CREATININE 1.29* 0.92 0.89  CALCIUM 8.7* 8.7* 9.1  MG 1.6* 2.5* 2.1  GFRNONAA 45* >60 >60  ANIONGAP 12 10 6     Recent Labs  Lab 05/28/21 1941 05/29/21 0639  PROT 7.3 6.8  ALBUMIN 3.8 3.1*  AST 26 25  ALT 19 19  ALKPHOS 95 73  BILITOT 1.4* 0.4   Lipids No results for input(s): CHOL, TRIG, HDL, LABVLDL, LDLCALC, CHOLHDL in the last 168 hours.  Hematology Recent Labs  Lab 05/28/21 1941 05/29/21 0639 05/30/21 0457  WBC 33.6* 34.0* 31.4*  RBC 4.06 3.48* 3.52*  HGB 13.3 11.8* 11.8*  HCT 39.8 34.5* 35.2*  MCV 98.0 99.1 100.0  MCH 32.8 33.9 33.5  MCHC 33.4 34.2 33.5  RDW 12.4 12.7 12.8  PLT 288 269 265   Thyroid  Recent Labs  Lab 05/29/21 0639  TSH 10.760*    BNPNo results for input(s): BNP, PROBNP in the last 168 hours.  DDimer No results for input(s): DDIMER in the last 168 hours.   Radiology/Studies:  CT Angio Chest PE W and/or Wo Contrast  Result Date: 05/28/2021 CLINICAL DATA:  Fever and shortness of breath. EXAM: CT ANGIOGRAPHY CHEST WITH CONTRAST TECHNIQUE: Multidetector CT imaging of the chest was performed using the standard protocol during bolus administration of intravenous contrast. Multiplanar CT image reconstructions and MIPs were obtained to evaluate the vascular anatomy. CONTRAST:  46mL OMNIPAQUE IOHEXOL 350 MG/ML SOLN COMPARISON:  Chest x-ray from same day. CTA chest dated August 05, 2013. FINDINGS: Cardiovascular: Satisfactory opacification of the pulmonary arteries to the segmental level. No evidence of pulmonary embolism. Chronic cardiomegaly status post TAVR. 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 No pericardial effusion. Motion artifact somewhat limits evaluation but the ascending thoracic aortic aneurysm is grossly  unchanged, currently measuring up to 4.3 cm. Coronary, aortic arch, and branch vessel atherosclerotic vascular disease. Mediastinum/Nodes: No enlarged mediastinal, hilar, or axillary lymph nodes. Thyroid gland, trachea, and esophagus demonstrate no significant findings. Lungs/Pleura: Moderate centrilobular emphysema. Mild subsegmental atelectasis at the lung bases No focal consolidation, pleural effusion, or pneumothorax. Upper Abdomen: No acute abnormality. Musculoskeletal: No chest wall abnormality. No acute or significant osseous findings. Review of the MIP images confirms the above findings. IMPRESSION: 1. No evidence of pulmonary embolism. No acute intrathoracic process. 2. Unchanged 4.3 cm ascending thoracic aortic aneurysm. Recommend annual imaging followup by CTA or MRA. This recommendation follows 2010 ACCF/AHA/AATS/ACR/ASA/SCA/SCAI/SIR/STS/SVM Guidelines for the Diagnosis and Management of Patients with Thoracic Aortic Disease. Circulation. 2010; 121: T062-I948. Aortic aneurysm NOS (ICD10-I71.9) 3. 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 4. Aortic Atherosclerosis (ICD10-I70.0) and Emphysema (ICD10-J43.9). Electronically Signed   By: Obie Dredge M.D.   On: 05/28/2021 22:08   DG Chest Port 1 View  Result Date: 05/28/2021 CLINICAL DATA:  Questionable sepsis. EXAM: PORTABLE CHEST 1 VIEW COMPARISON:  Chest x-ray dated September 16, 2013. FINDINGS: Stable cardiomegaly. Normal pulmonary vascularity. Mild bibasilar atelectasis. No focal consolidation, pleural effusion, or pneumothorax. No acute osseous abnormality. IMPRESSION: 1. Mild bibasilar atelectasis. Electronically Signed   By: Obie Dredge M.D.   On: 05/28/2021 20:40   ECHOCARDIOGRAM COMPLETE  Result Date: 05/29/2021    ECHOCARDIOGRAM REPORT   Patient  Name:   SABRENA JUPITER Date of Exam: 05/29/2021 Medical Rec #:  [id number]     Height:       60.0 in Accession #:    [id number]    Weight:       217.4 lb Date of Birth:  05/24/1951      BSA:          1.934 m Patient Age:    70 years      BP:           141/85 mmHg Patient Gender: F             HR:           81 bpm. Exam Location:  Inpatient Procedure: 2D Echo, Color Doppler and Cardiac Doppler Indications:    Fever R50.9  History:        Patient has prior history of Echocardiogram examinations, most                 recent 08/09/2014. CHF; Risk Factors:ETOH.                 Aortic Valve: 29 mm Sapien prosthetic, stented (TAVR) valve is                 present in the aortic position. Procedure Date: 08/09/13.  Sonographer:    Eulah Pont RDCS Referring Phys: Consepcion Hearing ANASTASSIA DOUTOVA IMPRESSIONS  1. Left ventricular ejection fraction, by estimation, is 60 to 65%. The left ventricle has normal function. The left ventricle has no regional wall motion abnormalities. There is moderate left ventricular hypertrophy. Left ventricular diastolic parameters are consistent with Grade I diastolic dysfunction (impaired relaxation). Elevated left ventricular end-diastolic pressure. The E/e' is 17.  2. Right ventricular systolic function is low normal. The right ventricular size is normal. There is normal pulmonary artery systolic pressure. The estimated right ventricular systolic pressure is 26.2 mmHg.  3. Left atrial size was moderately dilated.  4. Right atrial size was mildly dilated.  5. The mitral valve is abnormal.  Trivial mitral valve regurgitation. Moderate mitral annular calcification.  6. Prostetic valve EOA is 0.46 cm2 - the AT is prolonged at 123 msec, jet contour is rounded, DI is 0.15 -features consistent with severe prosthetic valve stenosis. The aortic valve has been repaired/replaced. Aortic valve regurgitation is not visualized. Critical aortic stenosis. There is a 29 mm Sapien prosthetic (TAVR) valve present in the  aortic position. Procedure Date: 08/09/13. Echo findings are consistent with stenosis and probable vegetation of the aortic prosthesis. Aortic valve area, by VTI measures 0.46 cm. Aortic valve mean gradient measures 93.2 mmHg. Aortic valve Vmax measures 5.82 m/s.  7. The inferior vena cava is normal in size with greater than 50% respiratory variability, suggesting right atrial pressure of 3 mmHg. Comparison(s): Changes from prior study are noted. 08/09/2014: LVEF 55-60%, s/p TAVR with mean gradient 8 mmHg. Conclusion(s)/Recommendation(s): Findings concerning for aortic valve vegetation of the bioprothesis, would recommend a Transesophageal Echocardiogram for clarification. Due to the above findings, cardiology consult is recommended. Critical findings reported to Dr. Maryfrances Bunnell and acknowledged at 2:30 pm. FINDINGS  Left Ventricle: Left ventricular ejection fraction, by estimation, is 60 to 65%. The left ventricle has normal function. The left ventricle has no regional wall motion abnormalities. The left ventricular internal cavity size was normal in size. There is  moderate left ventricular hypertrophy. Left ventricular diastolic parameters are consistent with Grade I diastolic dysfunction (impaired relaxation). Elevated left ventricular end-diastolic pressure. The E/e' is 52. Right Ventricle: The right ventricular size is normal. No increase in right ventricular wall thickness. Right ventricular systolic function is low normal. There is normal pulmonary artery systolic pressure. The tricuspid regurgitant velocity is 2.41 m/s,  and with an assumed right atrial pressure of 3 mmHg, the estimated right ventricular systolic pressure is 26.2 mmHg. Left Atrium: Left atrial size was moderately dilated. Right Atrium: Right atrial size was mildly dilated. Pericardium: There is no evidence of pericardial effusion. Mitral Valve: The mitral valve is abnormal. Moderate mitral annular calcification. Trivial mitral valve  regurgitation. Tricuspid Valve: The tricuspid valve is grossly normal. Tricuspid valve regurgitation is trivial. Aortic Valve: Prostetic valve EROA is 0.46 cm2 - the AT is prolonged at 123 msec, jet contour is rounded -features consistent with prostetic valve stenosis. The aortic valve has been repaired/replaced. Aortic valve regurgitation is mild. Critical aortic stenosis. Aortic valve mean gradient measures 93.2 mmHg. Aortic valve peak gradient measures 135.7 mmHg. Aortic valve area, by VTI measures 0.46 cm. There is a 29 mm Sapien prosthetic, stented (TAVR) valve present in the aortic position. Procedure Date:  08/09/13. Pulmonic Valve: The pulmonic valve was normal in structure. Pulmonic valve regurgitation is not visualized. Aorta: The aortic root and ascending aorta are structurally normal, with no evidence of dilitation. Venous: The inferior vena cava is normal in size with greater than 50% respiratory variability, suggesting right atrial pressure of 3 mmHg. IAS/Shunts: No atrial level shunt detected by color flow Doppler.  LEFT VENTRICLE PLAX 2D LVIDd:         4.30 cm  Diastology LVIDs:         2.80 cm  LV e' medial:    5.96 cm/s LV PW:         1.30 cm  LV E/e' medial:  17.1 LV IVS:        1.50 cm  LV e' lateral:   6.30 cm/s LVOT diam:     2.00 cm  LV E/e' lateral: 16.2 LV SV:  68 LV SV Index:   35 LVOT Area:     3.14 cm  RIGHT VENTRICLE RV S prime:     9.48 cm/s TAPSE (M-mode): 2.1 cm LEFT ATRIUM             Index       RIGHT ATRIUM           Index LA diam:        4.30 cm 2.22 cm/m  RA Area:     21.70 cm LA Vol (A2C):   72.5 ml 37.49 ml/m RA Volume:   67.60 ml  34.96 ml/m LA Vol (A4C):   75.8 ml 39.20 ml/m LA Biplane Vol: 74.4 ml 38.47 ml/m  AORTIC VALVE AV Area (Vmax):    0.52 cm AV Area (Vmean):   0.46 cm AV Area (VTI):     0.46 cm AV Vmax:           582.40 cm/s AV Vmean:          463.200 cm/s AV VTI:            1.480 m AV Peak Grad:      135.7 mmHg AV Mean Grad:      93.2 mmHg LVOT  Vmax:         95.60 cm/s LVOT Vmean:        67.400 cm/s LVOT VTI:          0.217 m LVOT/AV VTI ratio: 0.15  AORTA Ao Asc diam: 3.10 cm MITRAL VALVE                TRICUSPID VALVE MV Area (PHT): 4.15 cm     TR Peak grad:   23.2 mmHg MV Decel Time: 183 msec     TR Vmax:        241.00 cm/s MV E velocity: 102.00 cm/s MV A velocity: 94.10 cm/s   SHUNTS MV E/A ratio:  1.08         Systemic VTI:  0.22 m                             Systemic Diam: 2.00 cm Zoila Shutter MD Electronically signed by Zoila Shutter MD Signature Date/Time: 05/29/2021/2:38:00 PM    Final    CT RENAL STONE STUDY  Result Date: 05/29/2021 CLINICAL DATA:  Lower left-sided back pain. EXAM: CT ABDOMEN AND PELVIS WITHOUT CONTRAST TECHNIQUE: Multidetector CT imaging of the abdomen and pelvis was performed following the standard protocol without IV contrast. COMPARISON:  None. FINDINGS: Lower chest: Very mild atelectasis is seen within the bilateral lung bases. Hepatobiliary: No focal liver abnormality is seen. Multiple subcentimeter gallstones are seen within an otherwise normal-appearing gallbladder. Pancreas: Unremarkable. No pancreatic ductal dilatation or surrounding inflammatory changes. Spleen: Normal in size without focal abnormality. Adrenals/Urinary Tract: Adrenal glands are unremarkable. Kidneys are normal, without renal calculi, focal lesion, or hydronephrosis. Contrast is seen within the lumen of a mildly distended urinary bladder. Stomach/Bowel: Stomach is within normal limits. Appendix appears normal. No evidence of bowel wall thickening, distention, or inflammatory changes. Noninflamed diverticula are seen within the mid sigmoid colon. Vascular/Lymphatic: Aortic atherosclerosis. No enlarged abdominal or pelvic lymph nodes. Reproductive: Uterus and bilateral adnexa are unremarkable. Other: No abdominal wall hernia or abnormality. No abdominopelvic ascites. Musculoskeletal: No acute or significant osseous findings. IMPRESSION: 1.  Cholelithiasis without evidence of acute cholecystitis. 2. Sigmoid diverticulosis. 3. Aortic atherosclerosis. Aortic Atherosclerosis (ICD10-I70.0). Electronically Signed   By: Waylan Rocher  [place] M.D.   On: 05/29/2021 01:57     Assessment and Plan:   AS s/p TAVR  Pt with remote TAVR in 2015   Last echo in 2016    - echo with severe prosthetic valve stenosis with probably vegetation on the valve - mean gradient measured at 93.2 mmHg, AV area by VTI 0.46 cm2 - will obtain TEE tomorrow   Chronic systolic heart failure, hx of       Hx of prior to TAVR  IMproved after    Cardiac cath in 2015 without obstructive dz Normal now.   - DM - A1c 6.3%   Acute  respiratory failure COPD    COPD is significant     QUit smoking     Morbid obesity   AKI - sCr 1.29 on presentation  Today is 0.89      Hypothyroidism - TSH 10.7 - on synthroid         For questions or updates, please contact [facility] Please consult [email or web address] for contact info under    Signed, Angela Nicole Duke, PA  05/30/2021 8:32 AM  

## 2021-05-30 NOTE — Assessment & Plan Note (Addendum)
No wheezing - Continue Dulera -Continue PRN bronchodilators

## 2021-05-30 NOTE — Progress Notes (Signed)
Progress Note    CLOTILE WHITTINGTON   KXF:[id number]  DOB: 12/21/1950  DOA: 05/28/2021     1 Date of Service: 05/30/2021     Brief summary: [name] is a 70 y.o. F with COPD not on O2, EtOH use, pHTN, obesity, and TAVR who presented with fever.  Evidently the patient's son called on the phone, and she was not making sense so he came over and found that she was febrile, with chills and brought her to the ER.  In the ER, CT angiogram of the chest showed no PE or infiltrate, CT abdomen and pelvis was unremarkable.  Urinalysis clean.      10/4 admitted on empiric antibiotics for sepsis unclear source 10/5 blood cultures growing GPC, echo shows severe aortic stenosis and probably vegetation        Subjective:  No new fever, confusion, focal wewakness, no dyspnea, cough, wheezing, swelling.  Feeling well.  Hospital Problems * Severe sepsis [facility]) Presented with tachypnea, tachycardia, fever, and acute metabolic encephalopathy as well as blood pressure less than 100.   Cultures of blood now growing Strep, non-grpA, non-grpB, non-pneumo in 2/2.  TTE shows likely vegetation on her aortic valve.  -Continue Rocephin - Follow blood culture for sensitivities --> consult ID when suscept return - Consult cardiology for TEE - May need to consult CT surgery   Infective endocarditis of aortic valve See above, per sepsis  Acute respiratory failure with hypoxia (HCC) Does not use O2 at home, here SpO2 persistently <87% on room air.   - Continue O2  Acute metabolic encephalopathy Due to sepsis, this appears to be resolved now.  At baseline, she is independent, has no cognitive impairment, admission, she had an episode of confusion.  I have relatively low suspicion for septic TIA.     S/P TAVR (transcatheter aortic valve replacement) - Consult cardiology, appreciate cares  Hypomagnesemia Magnesium supplemented and resolved.  Hypokalemia was supplemented and resolved.   Alcohol  abuse No significant alcohol withdrawal at this point - CIWA scale with on-demand lorazepam - Continue thiamine and folate  Pulmonary hypertension, moderate to severe (HCC)     Hypothyroidism - Continue levothyroxine  Morbid obesity (HCC) BMI 42 AKI ruled out  COPD, severe (HCC) No wheezing - Continue Dulera -Continue PRN bronchodilators   Abnormal TSH     Euthyroid sick likely     - Repeat TSH after acute illness         Objective Vital signs were reviewed and unremarkable except for: SpO2: 87% BP (!) 147/82 (BP Location: Right Arm)   Pulse 90   Temp 98 F (36.7 C) (Oral)   Resp 18   Ht 5' (1.524 m)   Wt 98.6 kg   SpO2 (!) 88%   BMI 42.45 kg/m      Exam Physical Exam Vitals reviewed.  Constitutional:      Appearance: She is obese. She is not ill-appearing or toxic-appearing.  HENT:     Head: Normocephalic and atraumatic.  Cardiovascular:     Rate and Rhythm: Normal rate and regular rhythm.     Pulses: Normal pulses.     Heart sounds: Murmur heard.    No gallop.  Pulmonary:     Effort: Pulmonary effort is normal. No tachypnea or respiratory distress.     Breath sounds: Normal breath sounds. No wheezing, rhonchi or rales.  Abdominal:     Palpations: Abdomen is soft.     Tenderness: There is no abdominal tenderness.  Musculoskeletal:     Right lower leg: No edema.     Left lower leg: No edema.  Skin:    General: Skin is warm and dry.     Capillary Refill: Capillary refill takes less than 2 seconds.     Findings: No rash.  Neurological:     General: No focal deficit present.     Mental Status: She is alert and oriented to person, place, and time.     Motor: No weakness.  Psychiatric:        Mood and Affect: Mood normal. Mood is not anxious.        Behavior: Behavior normal. Behavior is not agitated.       Labs / Other Information My review of labs, imaging, notes and other tests is significant for WBC down to 31K, Glucose improved, TSH  10, Strept in 2/2 blood cultures     Time spent: 35 minutes [facility] 05/30/2021, 9:57 AM

## 2021-05-30 NOTE — Progress Notes (Signed)
0800- Resting quietly in bed with eyes closed. Respirations even and nonlabored. NAD noted. Will monitor.  1000- Dr. Maryfrances Bunnell in to to see pt.   1230- Pt sitting on side of bed finishing up breathing tx and talking with son, Kathlene November. Pt is frustrated because of changes made to plan of care. Pt is more anxious and agitated than usual. Will monitor.  1500- Pt's sister in law, Clydie Braun present and helping pt wash her hair.   1745- Lying in bed in nad. No complaints voiced. Call bell within reach. Will continue to monitor.

## 2021-05-30 NOTE — Assessment & Plan Note (Signed)
Magnesium supplemented and resolved.  Hypokalemia was supplemented and resolved.

## 2021-05-30 NOTE — Assessment & Plan Note (Signed)
No significant alcohol withdrawal at this point - CIWA scale with on-demand lorazepam - Continue thiamine and folate 

## 2021-05-30 NOTE — Assessment & Plan Note (Signed)
Due to sepsis, this appears to be resolved now.  At baseline, she is independent, has no cognitive impairment, admission, she had an episode of confusion.  I have relatively low suspicion for septic TIA.

## 2021-05-30 NOTE — Consult Note (Signed)
Cardiology Consultation:   Patient ID: Cathy Moody MRN: [id number]; DOB: 03-24-1951  Admit date: 05/28/2021 Date of Consult: 05/30/2021  PCP:  Cathy Nian, MD   [facility] Providers Cardiologist:  Dr. Eldridge Moody in 2014-2015, Dr. Elyn Moody with structural heart team in 2014-2015, Dr. Tenny Moody this admission   Patient Profile:   Cathy Moody is a 70 y.o. female with a hx of AS s/p TAVR in 2014, chronic systolic heart failure, NICM, tobacco abuse, COPD, and morbid obesity who is being seen 05/30/2021 for the evaluation of AV vegetation at the request of Dr. Maryfrances Moody.  History of Present Illness:   Cathy Moody established care with [facility] during a hospitalization 2014 found to have severe aortic valve stenosis and LVEF 25%. Heart cath showed no obstructive disease at that time. Given severe COPD, pulmonary HTN, and other comorbidities, she was turned down for SAVR and underwent TAVR 08/09/13. Course was complicated by pleural effusion requiring thoracentesis x 2. She also developed a rash on plavix. She was switched to effient. She was lost to follow up in after 2015. The pt says initially she felt better after TAVR but that after thoracentesis has had some SOB   Confusing picture is known COPD The pt and her son say over the past few wks to month she has had increased SOB with activity   She does not do that much but she would get winded.   Se says though when she goes through the store with a cart she is OK   Ge She presented to [facility] with confusion, SOB, and found to be hypoxic. WBC 33, normal lactic acid. Daily drinker but had not had ETOH in 2 days, confirmed with ethanol level. She was admitted for sepsis workup. Blood cultures growing GPC. Echo obtained and revealed severe AS and probable vegetation on aortic valve prosthesis. Cardiology was consulted.       Past Medical History:  Diagnosis Date   Acute CHF (HCC)    Cathy Moody 07/29/2013   Alcohol abuse    /notes 07/29/2013    Asthma    Heart murmur    Hypotension     Past Surgical History:  Procedure Laterality Date   INTRAOPERATIVE TRANSESOPHAGEAL ECHOCARDIOGRAM N/A 08/09/2013   Procedure: INTRAOPERATIVE TRANSESOPHAGEAL ECHOCARDIOGRAM;  Surgeon: Cathy Borne, MD;  Location: [facility] OR;  Service: Open Heart Surgery;  Laterality: N/A;   LEFT AND RIGHT HEART CATHETERIZATION WITH CORONARY ANGIOGRAM N/A 08/01/2013   Procedure: LEFT AND RIGHT HEART CATHETERIZATION WITH CORONARY ANGIOGRAM;  Surgeon: Cathy Noe, MD;  Location: [facility] CATH LAB;  Service: Cardiovascular;  Laterality: N/A;   TRANSCATHETER AORTIC VALVE REPLACEMENT, TRANSAPICAL Left 08/09/2013   Procedure: TRANSCATHETER AORTIC VALVE REPLACEMENT, TRANSAPICAL;  Surgeon: Cathy Borne, MD;  Location: [facility] OR;  Service: Open Heart Surgery;  Laterality: Left;       Inpatient Medications: Scheduled Meds:  aspirin EC  81 mg Oral Daily   enoxaparin (LOVENOX) injection  40 mg Subcutaneous Q24H   insulin aspart  0-15 Units Subcutaneous TID WC   insulin aspart  0-5 Units Subcutaneous QHS   ipratropium-albuterol  3 mL Nebulization TID   levothyroxine  112 mcg Oral Q0600   LORazepam  0-4 mg Intravenous Q6H   Or   LORazepam  0-4 mg Oral Q6H   [START ON 05/31/2021] LORazepam  0-4 mg Intravenous Q12H   Or   [START ON 05/31/2021] LORazepam  0-4 mg Oral Q12H   mometasone-formoterol  2 puff Inhalation BID  thiamine  100 mg Oral Daily   Or   thiamine  100 mg Intravenous Daily   Continuous Infusions:  cefTRIAXone (ROCEPHIN)  IV 2 g (05/29/21 1843)   metronidazole 500 mg (05/30/21 0056)   PRN Meds: acetaminophen **OR** acetaminophen, albuterol, HYDROcodone-acetaminophen  Allergies:    Allergies  Allergen Reactions   Plavix [Clopidogrel Bisulfate] Rash    Social History:   Social History   Socioeconomic History   Marital status: Divorced    Spouse name: Not on file   Number of children: Not on file   Years of education: Not on file   Highest  education level: Not on file  Occupational History   Not on file  Tobacco Use   Smoking status: Former    Types: Cigarettes    Quit date: 07/29/2013    Years since quitting: 7.8   Smokeless tobacco: Never  Vaping Use   Vaping Use: Never used  Substance and Sexual Activity   Alcohol use: Yes    Comment: Pt is weekend drinker   Drug use: No   Sexual activity: Not Currently  Other Topics Concern   Not on file  Social History Narrative   Not on file   Social Determinants of Health   Financial Resource Strain: Not on file  Food Insecurity: Not on file  Transportation Needs: Not on file  Physical Activity: Not on file  Stress: Not on file  Social Connections: Not on file  Intimate Partner Violence: Not on file    Family History:    Family History  Problem Relation Age of Onset   Liver disease Son    Diabetes Paternal Grandmother      ROS:  Please see the history of present illness.   All other ROS reviewed and negative.     Physical Exam/Data:   Vitals:   05/29/21 1951 05/29/21 2200 05/30/21 0538 05/30/21 0751  BP:  111/72 (!) 147/82   Pulse:  (!) 108 90   Resp:  18 18   Temp:  97.6 F (36.4 C) 98 F (36.7 C)   TempSrc:  Oral Oral   SpO2: 92% 95% 90% (!) 88%  Weight:      Height:        Intake/Output Summary (Last 24 hours) at 05/30/2021 0832 Last data filed at 05/30/2021 0318 Gross per 24 hour  Intake 1097.45 ml  Output --  Net 1097.45 ml   Last 3 Weights 05/29/2021 05/29/2021 03/23/2018  Weight (lbs) 217 lb 6 oz 198 lb 214 lb 3.2 oz  Weight (kg) 98.6 kg 89.812 kg 97.16 kg     Body mass index is 42.45 kg/m.  General:  Obese 70 yo in no acute distress HEENT: normal Neck: Neck is full   No obvious JVD  no bruits  Vascular: No carotid bruits; Distal pulses 2+ bilaterally Cardiac:  normal S1, S2; RRR; Gr II-III/VI mid/later peaking  systolic murmur at base  Lungs:  Some decreased airflow  No rales  no wheezes   Abd: soft, nontender,  No masses Ext: no  edema Musculoskeletal:  No deformities, BUE and BLE strength normal and equal Skin: warm and dry  Neuro:  CNs 2-12 intact, no focal abnormalities noted Psych:  Normal affect   EKG:  The EKG was personally reviewed and demonstrates:  ST  113  LVH     Telemetry:  Telemetry was personally reviewed and demonstrates:  not on tele  Relevant CV Studies: Echo   05/29/21 Left ventricular ejection fraction,  by estimation, is 60 to 65%. The left ventricle has normal function. The left ventricle has no regional wall motion abnormalities. There is moderate left ventricular hypertrophy. Left ventricular diastolic parameters are consistent with Grade I diastolic dysfunction (impaired relaxation). Elevated left ventricular end-diastolic pressure. The E/e' is 17. 1. Right ventricular systolic function is low normal. The right ventricular size is normal. There is normal pulmonary artery systolic pressure. The estimated right ventricular systolic pressure is 26.2 mmHg. 2. 3. Left atrial size was moderately dilated. 4. Right atrial size was mildly dilated. The mitral valve is abnormal. Trivial mitral valve regurgitation. Moderate mitral annular calcification. 5. Prostetic valve EOA is 0.46 cm2 - the AT is prolonged at 123 msec, jet contour is rounded, DI is 0.15 -features consistent with severe prosthetic valve stenosis. The aortic valve has been repaired/replaced. Aortic valve regurgitation is not visualized. Critical aortic stenosis. There is a 29 mm Sapien prosthetic (TAVR) valve present in the aortic position. Procedure Date: 08/09/13. Echo findings are consistent with stenosis and probable vegetation of the aortic prosthesis. Aortic valve area, by VTI measures 0.46 cm. Aortic valve mean gradient measures 93.2 mmHg. Aortic valve Vmax measures 5.82 m/s. 6. The inferior vena cava is normal in size with greater than 50% respiratory variability, suggesting right atrial pressure of 3  mmHg.  Laboratory Data:  High Sensitivity Troponin:  No results for input(s): TROPONINIHS in the last 720 hours.   Chemistry Recent Labs  Lab 05/28/21 1941 05/29/21 0639 05/30/21 0457  NA 136 142 144  K 3.4* 4.2 4.3  CL 100 106 113*  CO2 24 26 25   GLUCOSE 170* 268* 216*  BUN 17 14 14   CREATININE 1.29* 0.92 0.89  CALCIUM 8.7* 8.7* 9.1  MG 1.6* 2.5* 2.1  GFRNONAA 45* >60 >60  ANIONGAP 12 10 6     Recent Labs  Lab 05/28/21 1941 05/29/21 0639  PROT 7.3 6.8  ALBUMIN 3.8 3.1*  AST 26 25  ALT 19 19  ALKPHOS 95 73  BILITOT 1.4* 0.4   Lipids No results for input(s): CHOL, TRIG, HDL, LABVLDL, LDLCALC, CHOLHDL in the last 168 hours.  Hematology Recent Labs  Lab 05/28/21 1941 05/29/21 0639 05/30/21 0457  WBC 33.6* 34.0* 31.4*  RBC 4.06 3.48* 3.52*  HGB 13.3 11.8* 11.8*  HCT 39.8 34.5* 35.2*  MCV 98.0 99.1 100.0  MCH 32.8 33.9 33.5  MCHC 33.4 34.2 33.5  RDW 12.4 12.7 12.8  PLT 288 269 265   Thyroid  Recent Labs  Lab 05/29/21 0639  TSH 10.760*    BNPNo results for input(s): BNP, PROBNP in the last 168 hours.  DDimer No results for input(s): DDIMER in the last 168 hours.   Radiology/Studies:  CT Angio Chest PE W and/or Wo Contrast  Result Date: 05/28/2021 CLINICAL DATA:  Fever and shortness of breath. EXAM: CT ANGIOGRAPHY CHEST WITH CONTRAST TECHNIQUE: Multidetector CT imaging of the chest was performed using the standard protocol during bolus administration of intravenous contrast. Multiplanar CT image reconstructions and MIPs were obtained to evaluate the vascular anatomy. CONTRAST:  46mL OMNIPAQUE IOHEXOL 350 MG/ML SOLN COMPARISON:  Chest x-ray from same day. CTA chest dated August 05, 2013. FINDINGS: Cardiovascular: Satisfactory opacification of the pulmonary arteries to the segmental level. No evidence of pulmonary embolism. Chronic cardiomegaly status post TAVR. 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 No pericardial effusion. Motion artifact somewhat limits evaluation but the ascending thoracic aortic aneurysm is grossly  unchanged, currently measuring up to 4.3 cm. Coronary, aortic arch, and branch vessel atherosclerotic vascular disease. Mediastinum/Nodes: No enlarged mediastinal, hilar, or axillary lymph nodes. Thyroid gland, trachea, and esophagus demonstrate no significant findings. Lungs/Pleura: Moderate centrilobular emphysema. Mild subsegmental atelectasis at the lung bases No focal consolidation, pleural effusion, or pneumothorax. Upper Abdomen: No acute abnormality. Musculoskeletal: No chest wall abnormality. No acute or significant osseous findings. Review of the MIP images confirms the above findings. IMPRESSION: 1. No evidence of pulmonary embolism. No acute intrathoracic process. 2. Unchanged 4.3 cm ascending thoracic aortic aneurysm. Recommend annual imaging followup by CTA or MRA. This recommendation follows 2010 ACCF/AHA/AATS/ACR/ASA/SCA/SCAI/SIR/STS/SVM Guidelines for the Diagnosis and Management of Patients with Thoracic Aortic Disease. Circulation. 2010; 121: T062-I948. Aortic aneurysm NOS (ICD10-I71.9) 3. 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 4. Aortic Atherosclerosis (ICD10-I70.0) and Emphysema (ICD10-J43.9). Electronically Signed   By: Obie Dredge M.D.   On: 05/28/2021 22:08   DG Chest Port 1 View  Result Date: 05/28/2021 CLINICAL DATA:  Questionable sepsis. EXAM: PORTABLE CHEST 1 VIEW COMPARISON:  Chest x-ray dated September 16, 2013. FINDINGS: Stable cardiomegaly. Normal pulmonary vascularity. Mild bibasilar atelectasis. No focal consolidation, pleural effusion, or pneumothorax. No acute osseous abnormality. IMPRESSION: 1. Mild bibasilar atelectasis. Electronically Signed   By: Obie Dredge M.D.   On: 05/28/2021 20:40   ECHOCARDIOGRAM COMPLETE  Result Date: 05/29/2021    ECHOCARDIOGRAM REPORT   Patient  Name:   SABRENA JUPITER Date of Exam: 05/29/2021 Medical Rec #:  [id number]     Height:       60.0 in Accession #:    [id number]    Weight:       217.4 lb Date of Birth:  05/24/1951      BSA:          1.934 m Patient Age:    70 years      BP:           141/85 mmHg Patient Gender: F             HR:           81 bpm. Exam Location:  Inpatient Procedure: 2D Echo, Color Doppler and Cardiac Doppler Indications:    Fever R50.9  History:        Patient has prior history of Echocardiogram examinations, most                 recent 08/09/2014. CHF; Risk Factors:ETOH.                 Aortic Valve: 29 mm Sapien prosthetic, stented (TAVR) valve is                 present in the aortic position. Procedure Date: 08/09/13.  Sonographer:    Eulah Pont RDCS Referring Phys: Consepcion Hearing ANASTASSIA DOUTOVA IMPRESSIONS  1. Left ventricular ejection fraction, by estimation, is 60 to 65%. The left ventricle has normal function. The left ventricle has no regional wall motion abnormalities. There is moderate left ventricular hypertrophy. Left ventricular diastolic parameters are consistent with Grade I diastolic dysfunction (impaired relaxation). Elevated left ventricular end-diastolic pressure. The E/e' is 17.  2. Right ventricular systolic function is low normal. The right ventricular size is normal. There is normal pulmonary artery systolic pressure. The estimated right ventricular systolic pressure is 26.2 mmHg.  3. Left atrial size was moderately dilated.  4. Right atrial size was mildly dilated.  5. The mitral valve is abnormal.  Trivial mitral valve regurgitation. Moderate mitral annular calcification.  6. Prostetic valve EOA is 0.46 cm2 - the AT is prolonged at 123 msec, jet contour is rounded, DI is 0.15 -features consistent with severe prosthetic valve stenosis. The aortic valve has been repaired/replaced. Aortic valve regurgitation is not visualized. Critical aortic stenosis. There is a 29 mm Sapien prosthetic (TAVR) valve present in the  aortic position. Procedure Date: 08/09/13. Echo findings are consistent with stenosis and probable vegetation of the aortic prosthesis. Aortic valve area, by VTI measures 0.46 cm. Aortic valve mean gradient measures 93.2 mmHg. Aortic valve Vmax measures 5.82 m/s.  7. The inferior vena cava is normal in size with greater than 50% respiratory variability, suggesting right atrial pressure of 3 mmHg. Comparison(s): Changes from prior study are noted. 08/09/2014: LVEF 55-60%, s/p TAVR with mean gradient 8 mmHg. Conclusion(s)/Recommendation(s): Findings concerning for aortic valve vegetation of the bioprothesis, would recommend a Transesophageal Echocardiogram for clarification. Due to the above findings, cardiology consult is recommended. Critical findings reported to Dr. Maryfrances Moody and acknowledged at 2:30 pm. FINDINGS  Left Ventricle: Left ventricular ejection fraction, by estimation, is 60 to 65%. The left ventricle has normal function. The left ventricle has no regional wall motion abnormalities. The left ventricular internal cavity size was normal in size. There is  moderate left ventricular hypertrophy. Left ventricular diastolic parameters are consistent with Grade I diastolic dysfunction (impaired relaxation). Elevated left ventricular end-diastolic pressure. The E/e' is 52. Right Ventricle: The right ventricular size is normal. No increase in right ventricular wall thickness. Right ventricular systolic function is low normal. There is normal pulmonary artery systolic pressure. The tricuspid regurgitant velocity is 2.41 m/s,  and with an assumed right atrial pressure of 3 mmHg, the estimated right ventricular systolic pressure is 26.2 mmHg. Left Atrium: Left atrial size was moderately dilated. Right Atrium: Right atrial size was mildly dilated. Pericardium: There is no evidence of pericardial effusion. Mitral Valve: The mitral valve is abnormal. Moderate mitral annular calcification. Trivial mitral valve  regurgitation. Tricuspid Valve: The tricuspid valve is grossly normal. Tricuspid valve regurgitation is trivial. Aortic Valve: Prostetic valve EROA is 0.46 cm2 - the AT is prolonged at 123 msec, jet contour is rounded -features consistent with prostetic valve stenosis. The aortic valve has been repaired/replaced. Aortic valve regurgitation is mild. Critical aortic stenosis. Aortic valve mean gradient measures 93.2 mmHg. Aortic valve peak gradient measures 135.7 mmHg. Aortic valve area, by VTI measures 0.46 cm. There is a 29 mm Sapien prosthetic, stented (TAVR) valve present in the aortic position. Procedure Date:  08/09/13. Pulmonic Valve: The pulmonic valve was normal in structure. Pulmonic valve regurgitation is not visualized. Aorta: The aortic root and ascending aorta are structurally normal, with no evidence of dilitation. Venous: The inferior vena cava is normal in size with greater than 50% respiratory variability, suggesting right atrial pressure of 3 mmHg. IAS/Shunts: No atrial level shunt detected by color flow Doppler.  LEFT VENTRICLE PLAX 2D LVIDd:         4.30 cm  Diastology LVIDs:         2.80 cm  LV e' medial:    5.96 cm/s LV PW:         1.30 cm  LV E/e' medial:  17.1 LV IVS:        1.50 cm  LV e' lateral:   6.30 cm/s LVOT diam:     2.00 cm  LV E/e' lateral: 16.2 LV SV:  68 LV SV Index:   35 LVOT Area:     3.14 cm  RIGHT VENTRICLE RV S prime:     9.48 cm/s TAPSE (M-mode): 2.1 cm LEFT ATRIUM             Index       RIGHT ATRIUM           Index LA diam:        4.30 cm 2.22 cm/m  RA Area:     21.70 cm LA Vol (A2C):   72.5 ml 37.49 ml/m RA Volume:   67.60 ml  34.96 ml/m LA Vol (A4C):   75.8 ml 39.20 ml/m LA Biplane Vol: 74.4 ml 38.47 ml/m  AORTIC VALVE AV Area (Vmax):    0.52 cm AV Area (Vmean):   0.46 cm AV Area (VTI):     0.46 cm AV Vmax:           582.40 cm/s AV Vmean:          463.200 cm/s AV VTI:            1.480 m AV Peak Grad:      135.7 mmHg AV Mean Grad:      93.2 mmHg LVOT  Vmax:         95.60 cm/s LVOT Vmean:        67.400 cm/s LVOT VTI:          0.217 m LVOT/AV VTI ratio: 0.15  AORTA Ao Asc diam: 3.10 cm MITRAL VALVE                TRICUSPID VALVE MV Area (PHT): 4.15 cm     TR Peak grad:   23.2 mmHg MV Decel Time: 183 msec     TR Vmax:        241.00 cm/s MV E velocity: 102.00 cm/s MV A velocity: 94.10 cm/s   SHUNTS MV E/A ratio:  1.08         Systemic VTI:  0.22 m                             Systemic Diam: 2.00 cm Zoila Shutter MD Electronically signed by Zoila Shutter MD Signature Date/Time: 05/29/2021/2:38:00 PM    Final    CT RENAL STONE STUDY  Result Date: 05/29/2021 CLINICAL DATA:  Lower left-sided back pain. EXAM: CT ABDOMEN AND PELVIS WITHOUT CONTRAST TECHNIQUE: Multidetector CT imaging of the abdomen and pelvis was performed following the standard protocol without IV contrast. COMPARISON:  None. FINDINGS: Lower chest: Very mild atelectasis is seen within the bilateral lung bases. Hepatobiliary: No focal liver abnormality is seen. Multiple subcentimeter gallstones are seen within an otherwise normal-appearing gallbladder. Pancreas: Unremarkable. No pancreatic ductal dilatation or surrounding inflammatory changes. Spleen: Normal in size without focal abnormality. Adrenals/Urinary Tract: Adrenal glands are unremarkable. Kidneys are normal, without renal calculi, focal lesion, or hydronephrosis. Contrast is seen within the lumen of a mildly distended urinary bladder. Stomach/Bowel: Stomach is within normal limits. Appendix appears normal. No evidence of bowel wall thickening, distention, or inflammatory changes. Noninflamed diverticula are seen within the mid sigmoid colon. Vascular/Lymphatic: Aortic atherosclerosis. No enlarged abdominal or pelvic lymph nodes. Reproductive: Uterus and bilateral adnexa are unremarkable. Other: No abdominal wall hernia or abnormality. No abdominopelvic ascites. Musculoskeletal: No acute or significant osseous findings. IMPRESSION: 1.  Cholelithiasis without evidence of acute cholecystitis. 2. Sigmoid diverticulosis. 3. Aortic atherosclerosis. Aortic Atherosclerosis (ICD10-I70.0). Electronically Signed   By: Waylan Rocher  [place] M.D.   On: 05/29/2021 01:57     Assessment and Plan:   AS s/p TAVR  Pt with remote TAVR in 2015   Last echo in 2016    - echo with severe prosthetic valve stenosis with probably vegetation on the valve - mean gradient measured at 93.2 mmHg, AV area by VTI 0.46 cm2 - will obtain TEE tomorrow   Chronic systolic heart failure, hx of       Hx of prior to TAVR  IMproved after    Cardiac cath in 2015 without obstructive dz Normal now.   - DM - A1c 6.3%   Acute  respiratory failure COPD    COPD is significant     QUit smoking     Morbid obesity   AKI - sCr 1.29 on presentation  Today is 0.89      Hypothyroidism - TSH 10.7 - on synthroid         For questions or updates, please contact [facility] Please consult [email or web address] for contact info under    Signed, Marcelino Duster, PA  05/30/2021 8:32 AM

## 2021-05-30 NOTE — Assessment & Plan Note (Signed)
See above, per sepsis

## 2021-05-30 NOTE — Assessment & Plan Note (Signed)
Does not use O2 at home, here SpO2 persistently <87% on room air.   - Continue O2

## 2021-05-30 NOTE — Progress Notes (Signed)
Inpatient Diabetes Program Recommendations  AACE/ADA: New Consensus Statement on Inpatient Glycemic Control (2015)  Target Ranges:  Prepandial:   less than 140 mg/dL      Peak postprandial:   less than 180 mg/dL (1-2 hours)      Critically ill patients:  140 - 180 mg/dL   Lab Results  Component Value Date   GLUCAP 188 (H) 05/30/2021   HGBA1C 6.3 (H) 05/30/2021    Results for Cathy Moody, Cathy Moody (MRN [id number]) as of 05/30/2021 12:02  Ref. Range 05/29/2021 18:55 05/29/2021 21:01 05/30/2021 07:35  Glucose-Capillary Latest Ref Range: 70 - 99 mg/dL 270 (H) 623 (H) 762 (H)    Spoke with pt at bedside regarding A1c level of 6.3% and for her to get a recheck by her PCP and consider what things she eats and drinks. Pt expresses concerns over the insulin being prescribed and her glucose levels. I explained she had solumedrol yesterday and many physicians prescribe the insulin to cover glucose elevations related to steroids. Pt would like to be more informed of her care. Pt did express that her father has Diabetes. Encouraged follow up.  Thanks,  Christena Deem RN, MSN, BC-ADM Inpatient Diabetes Coordinator Team Pager [phone] (8a-5p)

## 2021-05-30 NOTE — Assessment & Plan Note (Signed)
-   Consult cardiology, appreciate cares 

## 2021-05-30 NOTE — Assessment & Plan Note (Signed)
Presented with tachypnea, tachycardia, fever, and acute metabolic encephalopathy as well as blood pressure less than 100.   Cultures of blood now growing Strep, non-grpA, non-grpB, non-pneumo in 2/2.  TTE shows likely vegetation on her aortic valve.  -Continue Rocephin - Follow blood culture for sensitivities --> consult ID when suscept return - Consult cardiology for TEE - May need to consult CT surgery

## 2021-05-30 NOTE — Assessment & Plan Note (Signed)
BMI 42 AKI ruled out 

## 2021-05-30 NOTE — Assessment & Plan Note (Signed)
Continue levothyroxine 

## 2021-05-31 ENCOUNTER — Inpatient Hospital Stay (HOSPITAL_COMMUNITY): Payer: PPO

## 2021-05-31 ENCOUNTER — Inpatient Hospital Stay (HOSPITAL_COMMUNITY): Payer: PPO | Admitting: Anesthesiology

## 2021-05-31 ENCOUNTER — Encounter (HOSPITAL_COMMUNITY)
Admission: EM | Disposition: A | Discharge status: Home Health | Payer: Self-pay | Source: Home / Self Care | Attending: Internal Medicine

## 2021-05-31 ENCOUNTER — Encounter (HOSPITAL_COMMUNITY): Payer: Self-pay | Admitting: Family Medicine

## 2021-05-31 DIAGNOSIS — J9601 Acute respiratory failure with hypoxia: Secondary | ICD-10-CM | POA: Diagnosis not present

## 2021-05-31 DIAGNOSIS — I350 Nonrheumatic aortic (valve) stenosis: Secondary | ICD-10-CM

## 2021-05-31 DIAGNOSIS — A419 Sepsis, unspecified organism: Secondary | ICD-10-CM

## 2021-05-31 DIAGNOSIS — G9341 Metabolic encephalopathy: Secondary | ICD-10-CM | POA: Diagnosis not present

## 2021-05-31 DIAGNOSIS — I330 Acute and subacute infective endocarditis: Secondary | ICD-10-CM | POA: Diagnosis not present

## 2021-05-31 DIAGNOSIS — I38 Endocarditis, valve unspecified: Secondary | ICD-10-CM

## 2021-05-31 DIAGNOSIS — R7881 Bacteremia: Secondary | ICD-10-CM

## 2021-05-31 DIAGNOSIS — R6520 Severe sepsis without septic shock: Secondary | ICD-10-CM

## 2021-05-31 DIAGNOSIS — I340 Nonrheumatic mitral (valve) insufficiency: Secondary | ICD-10-CM

## 2021-05-31 HISTORY — PX: TEE WITHOUT CARDIOVERSION: SHX5443

## 2021-05-31 LAB — BASIC METABOLIC PANEL
Anion gap: 7 (ref 5–15)
BUN: 16 mg/dL (ref 8–23)
CO2: 26 mmol/L (ref 22–32)
Calcium: 8.1 mg/dL — ABNORMAL LOW (ref 8.9–10.3)
Chloride: 105 mmol/L (ref 98–111)
Creatinine, Ser: 0.83 mg/dL (ref 0.44–1.00)
GFR, Estimated: >60 mL/min (ref >60)
Glucose, Bld: 163 mg/dL — ABNORMAL HIGH (ref 70–99)
Potassium: 3.8 mmol/L (ref 3.5–5.1)
Sodium: 138 mmol/L (ref 135–145)

## 2021-05-31 LAB — CULTURE, BLOOD (ROUTINE X 2)

## 2021-05-31 LAB — CBC
HCT: 34.8 % — ABNORMAL LOW (ref 36.0–46.0)
Hemoglobin: 11.7 g/dL — ABNORMAL LOW (ref 12.0–15.0)
MCH: 33.4 pg (ref 26.0–34.0)
MCHC: 33.6 g/dL (ref 30.0–36.0)
MCV: 99.4 fL (ref 80.0–100.0)
Platelets: 261 10*3/uL (ref 150–400)
RBC: 3.5 MIL/uL — ABNORMAL LOW (ref 3.87–5.11)
RDW: 12.9 % (ref 11.5–15.5)
WBC: 24.6 10*3/uL — ABNORMAL HIGH (ref 4.0–10.5)
nRBC: 0 % (ref 0.0–0.2)

## 2021-05-31 LAB — ECHO TEE
AR max vel: 0.44 cm2
AV Area VTI: 0.43 cm2
AV Area mean vel: 0.39 cm2
AV Mean grad: 64 mmHg
AV Peak grad: 98 mmHg
Ao pk vel: 4.95 m/s

## 2021-05-31 SURGERY — ECHOCARDIOGRAM, TRANSESOPHAGEAL
Anesthesia: Monitor Anesthesia Care

## 2021-05-31 MED ORDER — HYDROXYZINE HCL 25 MG PO TABS
25.0000 mg | ORAL_TABLET | Freq: Three times a day (TID) | ORAL | Status: DC | PRN
  Administered 2021-06-01 – 2021-06-02 (×2): 25 mg via ORAL
  Filled 2021-05-31 (×3): qty 1

## 2021-05-31 MED ORDER — PROPOFOL 500 MG/50ML IV EMUL
INTRAVENOUS | Status: DC | PRN
  Administered 2021-05-31: 50 ug/kg/min via INTRAVENOUS

## 2021-05-31 MED ORDER — LIDOCAINE VISCOUS HCL 2 % MT SOLN
OROMUCOSAL | Status: DC | PRN
  Administered 2021-05-31: 10 mL via OROMUCOSAL

## 2021-05-31 MED ORDER — PENICILLIN G POTASSIUM 20000000 UNITS IJ SOLR
12.0000 10*6.[IU] | Freq: Two times a day (BID) | INTRAVENOUS | Status: DC
  Administered 2021-05-31 – 2021-06-02 (×4): 12 10*6.[IU] via INTRAVENOUS
  Filled 2021-05-31 (×6): qty 12

## 2021-05-31 MED ORDER — CALAMINE EX LOTN
TOPICAL_LOTION | Freq: Two times a day (BID) | CUTANEOUS | Status: DC | PRN
  Filled 2021-05-31: qty 118

## 2021-05-31 MED ORDER — SODIUM CHLORIDE 0.9 % IV SOLN: INTRAVENOUS | Status: DC

## 2021-05-31 MED ORDER — LIDOCAINE VISCOUS HCL 2 % MT SOLN
OROMUCOSAL | Status: AC
  Filled 2021-05-31: qty 15

## 2021-05-31 MED ORDER — ALBUTEROL SULFATE HFA 108 (90 BASE) MCG/ACT IN AERS
2.0000 | INHALATION_SPRAY | RESPIRATORY_TRACT | Status: DC | PRN
  Administered 2021-05-31: 2 via RESPIRATORY_TRACT
  Filled 2021-05-31 (×2): qty 6.7

## 2021-05-31 MED ORDER — ALBUTEROL SULFATE (2.5 MG/3ML) 0.083% IN NEBU
INHALATION_SOLUTION | RESPIRATORY_TRACT | Status: AC
  Filled 2021-05-31: qty 3

## 2021-05-31 MED ORDER — PROPOFOL 10 MG/ML IV BOLUS
INTRAVENOUS | Status: DC | PRN
Start: 2021-05-31 — End: 2021-05-31
  Administered 2021-05-31 (×2): 10 mg via INTRAVENOUS

## 2021-05-31 MED ORDER — PHENYLEPHRINE HCL-NACL 20-0.9 MG/250ML-% IV SOLN
INTRAVENOUS | Status: DC | PRN
Start: 2021-05-31 — End: 2021-05-31
  Administered 2021-05-31: 25 ug/min via INTRAVENOUS

## 2021-05-31 NOTE — Anesthesia Postprocedure Evaluation (Signed)
Anesthesia Post Note  Patient: Cathy Moody  Procedure(s) Performed: TRANSESOPHAGEAL ECHOCARDIOGRAM (TEE)     Patient location during evaluation: Endoscopy Anesthesia Type: MAC Level of consciousness: awake and alert Pain management: pain level controlled Vital Signs Assessment: post-procedure vital signs reviewed and stable Respiratory status: spontaneous breathing, nonlabored ventilation, respiratory function stable and patient connected to nasal cannula oxygen Cardiovascular status: stable and blood pressure returned to baseline Postop Assessment: no apparent nausea or vomiting Anesthetic complications: no   No notable events documented.  Last Vitals:  Vitals:   05/31/21 1426 05/31/21 1436  BP: 133/61 135/79  Pulse: 95 95  Resp: (!) 24 14  Temp:    SpO2: 94% 95%    Last Pain:  Vitals:   05/31/21 1436  TempSrc:   PainSc: 0-No pain                 Belenda Cruise P Eriyana Sweeten

## 2021-05-31 NOTE — Care Management Important Message (Signed)
Important Message  Patient Details IM Letter given to the Patient. Name: AIDEL DAVISSON MRN: [id number] Date of Birth: Jun 19, 1951   Medicare Important Message Given:  Yes     Caren Macadam 05/31/2021, 11:31 AM

## 2021-05-31 NOTE — Progress Notes (Addendum)
Progress Note  Patient Name: THAYER INABINET Date of Encounter: 05/31/2021  [facility] Cardiologist: Dietrich Pates, MD   Subjective   Breathing is unchanged   N oCP    Inpatient Medications    Scheduled Meds:  [MAR Hold] aspirin EC  81 mg Oral Daily   [MAR Hold] enoxaparin (LOVENOX) injection  40 mg Subcutaneous Q24H   [MAR Hold] levothyroxine  112 mcg Oral Q0600   [MAR Hold] LORazepam  0-4 mg Intravenous Q12H   Or   [MAR Hold] LORazepam  0-4 mg Oral Q12H   [MAR Hold] mometasone-formoterol  2 puff Inhalation BID   [MAR Hold] thiamine  100 mg Oral Daily   Or   [MAR Hold] thiamine  100 mg Intravenous Daily   Continuous Infusions:  sodium chloride     [MAR Hold] cefTRIAXone (ROCEPHIN)  IV 2 g (05/30/21 1836)   penicillin g continuous IV infusion     PRN Meds: [MAR Hold] acetaminophen **OR** [MAR Hold] acetaminophen, [MAR Hold] albuterol, [MAR Hold] albuterol, [MAR Hold] doxylamine (Sleep), [MAR Hold] HYDROcodone-acetaminophen, [MAR Hold] ipratropium-albuterol   Vital Signs    Vitals:   05/31/21 1200 05/31/21 1416 05/31/21 1426 05/31/21 1436  BP: (!) 160/81 (!) 134/58 133/61 135/79  Pulse: 96 88 95 95  Resp: (!) 28 (!) 22 (!) 24 14  Temp: 97.8 F (36.6 C) (!) 97.3 F (36.3 C)    TempSrc: Temporal Temporal    SpO2: 96% 95% 94% 95%  Weight: 98.6 kg     Height: 5' (1.524 m)       Intake/Output Summary (Last 24 hours) at 05/31/2021 1441 Last data filed at 05/31/2021 1413 Gross per 24 hour  Intake 200 ml  Output --  Net 200 ml   Last 3 Weights 05/31/2021 05/29/2021 05/29/2021  Weight (lbs) 217 lb 6 oz 217 lb 6 oz 198 lb  Weight (kg) 98.6 kg 98.6 kg 89.812 kg      Telemetry    SR  - Personally Reviewed  ECG    No new  - Personally Reviewed  Physical Exam   GEN: Morbidly obese 70 yo  acute distress.   Neck: No JVD Cardiac: RRR, Gr III/VI sysotlic murmur LUSB Respiratory: Decreased airflow   Mild crackle base GI: Soft, nontender, non-distended  MS: No  edema; No deformity. Neuro:  Nonfocal  Psych: Normal affect   Labs    High Sensitivity Troponin:  No results for input(s): TROPONINIHS in the last 720 hours.   Chemistry Recent Labs  Lab 05/28/21 1941 05/29/21 0639 05/30/21 0457 05/31/21 0445  NA 136 142 144 138  K 3.4* 4.2 4.3 3.8  CL 100 106 113* 105  CO2 24 26 25 26   GLUCOSE 170* 268* 216* 163*  BUN 17 14 14 16   CREATININE 1.29* 0.92 0.89 0.83  CALCIUM 8.7* 8.7* 9.1 8.1*  MG 1.6* 2.5* 2.1  --   PROT 7.3 6.8  --   --   ALBUMIN 3.8 3.1*  --   --   AST 26 25  --   --   ALT 19 19  --   --   ALKPHOS 95 73  --   --   BILITOT 1.4* 0.4  --   --   GFRNONAA 45* >60 >60 >60  ANIONGAP 12 10 6 7     Lipids No results for input(s): CHOL, TRIG, HDL, LABVLDL, LDLCALC, CHOLHDL in the last 168 hours.  Hematology Recent Labs  Lab 05/29/21 [phone] 05/30/21 0457 05/31/21 0445  WBC 34.0* 31.4* 24.6*  RBC 3.48* 3.52* 3.50*  HGB 11.8* 11.8* 11.7*  HCT 34.5* 35.2* 34.8*  MCV 99.1 100.0 99.4  MCH 33.9 33.5 33.4  MCHC 34.2 33.5 33.6  RDW 12.7 12.8 12.9  PLT 269 265 261   Thyroid  Recent Labs  Lab 05/29/21 0639  TSH 10.760*    BNPNo results for input(s): BNP, PROBNP in the last 168 hours.  DDimer No results for input(s): DDIMER in the last 168 hours.   Radiology    No results found.  Cardiac Studies    Patient Profile     KIERAH GOATLEY is a 70 y.o. female with a hx of AS s/p TAVR in 2014, chronic systolic heart failure, NICM, tobacco abuse, COPD, and morbid obesity who is being seen 05/30/2021 for the evaluation of AV vegetation at the request of Dr. Maryfrances Bunnell.  Assessment & Plan    1  AV disease   Pt is s/p TAVR in 2015   Last echo until this admit was 2016 Yesterday  TTE showe severe stenosis with probable vegetation on valve   Meak gradient measured93 mm    TEE today confirms calcification and restricted valve motion and mobile densiites consistent with vegetations   One mobile densitiy on aortic side is bright, question  age   Note that blood cultures are positive for enterococcus.     REcomm IV ABX for endocarditis     I have reviewed with [facility] (Structural)   Agrees with ABX   Follow gradients over time    At 3 months if gradient still high may consider anticoagulation for possible thrombus  NOt now Would recomm follow up in structural clinc with [facility] or [facility]   With severe COPD , options limited if no change   For questions or updates, please contact [facility] Please consult [email or web address] for contact info under        Signed, Dietrich Pates, MD  05/31/2021, 2:41 PM

## 2021-05-31 NOTE — CV Procedure (Signed)
    TRANSESOPHAGEAL ECHOCARDIOGRAM   NAME:  Cathy Moody    MRN: [id number] DOB:  Aug 12, 1951    ADMIT DATE: 05/28/2021  INDICATIONS: Aortic stenosis, Sepsis  PROCEDURE:   Informed consent was obtained prior to the procedure. The risks, benefits and alternatives for the procedure were discussed and the patient comprehended these risks.  Risks include, but are not limited to, cough, sore throat, vomiting, nausea, somnolence, esophageal and stomach trauma or perforation, bleeding, low blood pressure, aspiration, pneumonia, infection, trauma to the teeth and death.    Procedural time out performed. The oropharynx was anesthetized with 10 cc of viscous lidocaine.  Anesthesia was administered by the anesthesiology team.The patient's heart rate, blood pressure, and oxygen saturation are monitored continuously during the procedure.  The transesophageal probe was inserted in the esophagus and stomach without difficulty and multiple views were obtained.   COMPLICATIONS:    There were no immediate complications.  KEY FINDINGS:  Normal left ventricular systolic function.  EF 60 to 65%.  Highly mobile mass on the bioprosthetic valve highly suspicious for vegetation.  Evidence of severe stenosis of the bioprosthetic valve.  Trivial mitral regurgitation.  The tricuspid valve was not well seen.  Pulmonic valve not well visualized.  Normal left atrial appendage clot noted. Full report to follow. Further management per primary team.   Thomasene Ripple, DO [facility]  [facility]  2:15 PM

## 2021-05-31 NOTE — Progress Notes (Addendum)
ID consult note   Cc: AV endocarditis, strep mitis; hx TAVR Attending: dr Hanley Ben  Abx: 10/5-c vanc 10/5-c cefepime  A/p: Sepsis Av endocarditis previous tavr; strep mitis PCN sensitive; tee positive confusion Etoh abuse Copd  At this time doesn't appear needing any urgent surgical intervention Has a rash possibly cefepime.   She has hx of taking amoxicillin without issue. Reasonable to try penicillin g for now and reevaluate if rash is worse  For her confusion, appears metabolic in nature. Can continue to monitor for now, and if worse consider mri brain    -will need to w/u confusion to r/o septic emboli, vs etoh related vs metabolic encephalopathy -transition abx to penicillin g -plan 6 weeks abx -likely would benefit from ct surgery input -discussed with primary team   I spent 60 minute reviewing data/chart, and coordinating care and >50% direct face to face time providing counseling/discussing diagnostics/treatment plan with patient     Hpi: 70 year old female with history of COPD, etoh abuse, pulmonary hypertension, obesity, s/p TAVR, admitted 10/04 for sepsis/fever, found to have strep mitis high burden bacteremia, and echocardiographic support of aortic valve mobile vegetation  Tavr 07/2013 Chronic stable dyspnea in setting copd  Prodrome of a few weeks up to a month worsening dyspnea On the day of ed visit has confusion, fever, hypoxemic, elevated wbc @ 33. Ems was notified by son who found her confused/febrile  Bcx strep mitis Tte severe AS and probable aortic valve vegetation  Cards consulted and at  today 10/7 for tee (Dr Tenny Craw had read tee and confirms av vegetation)  Fever and leukocytosis had improved with empiric bsAbx  Patient narrowed today 10/7 to ceftriaxone Repeat bcx ordered  Patient reports itchy rash on her back onset this morning. Has been on vanc/cefepime Took amox before no problem  No other focal pain area No dyspnea,  chest pain, cough  Since admission, confusion better, and feeling better overall  ROS: All other ros negative  Family History  Problem Relation Age of Onset   Liver disease Son    Diabetes Paternal Grandmother    Past Medical History:  Diagnosis Date   Acute CHF (HCC)    [name] 07/29/2013   Alcohol abuse    /notes 07/29/2013   Asthma    Heart murmur    Hypotension    Social History   Tobacco Use   Smoking status: Former    Types: Cigarettes    Quit date: 07/29/2013    Years since quitting: 7.8   Smokeless tobacco: Never  Vaping Use   Vaping Use: Never used  Substance Use Topics   Alcohol use: Yes    Comment: Pt is weekend drinker   Drug use: No   Physical exam: Vitals:   05/31/21 1521 05/31/21 1753  BP: 122/77 138/73  Pulse: 81 91  Resp: (!) 24 19  Temp:  98 F (36.7 C)  SpO2: 97% 97%   General/constitutional: no distress, pleasant, conversant, obese HEENT: Normocephalic, PER, Conj Clear, EOMI, Oropharynx clear Neck supple CV: rrr no mrg Lungs: clear to auscultation, normal respiratory effort Abd: Soft, Nontender Ext: no edema Skin: erythematous maculopapular rash upper back Neuro: nonfocal MSK: no peripheral joint swelling/tenderness/warmth; back spines nontender Psych: alert/oriented    Labs/imaging: Reviewed  10/04 bcx 2 of 2 sets strep mitis sensitive to pcn (mic 0.06) 10/07 bcx pending    Lab Results  Component Value Date   WBC 24.6 (H) 05/31/2021   HGB 11.7 (L) 05/31/2021  HCT 34.8 (L) 05/31/2021   MCV 99.4 05/31/2021   PLT 261 05/31/2021   Last metabolic panel Lab Results  Component Value Date   GLUCOSE 163 (H) 05/31/2021   NA 138 05/31/2021   K 3.8 05/31/2021   CL 105 05/31/2021   CO2 26 05/31/2021   BUN 16 05/31/2021   CREATININE 0.83 05/31/2021   GFRNONAA >60 05/31/2021   CALCIUM 8.1 (L) 05/31/2021   PHOS 4.0 05/29/2021   PROT 6.8 05/29/2021   ALBUMIN 3.1 (L) 05/29/2021   BILITOT 0.4 05/29/2021   ALKPHOS 73  05/29/2021   AST 25 05/29/2021   ALT 19 05/29/2021   ANIONGAP 7 05/31/2021   10/04 ct angio chest 1. No evidence of pulmonary embolism. No acute intrathoracic process. 2. Unchanged 4.3 cm ascending thoracic aortic aneurysm. Recommend annual imaging followup by CTA or MRA. This recommendation follows 2010 ACCF/AHA/AATS/ACR/ASA/SCA/SCAI/SIR/STS/SVM Guidelines for the Diagnosis and Management of Patients with Thoracic Aortic Disease. Circulation. 2010; 121: G182-X937. Aortic aneurysm NOS (ICD10-I71.9) 3. 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 4. Aortic Atherosclerosis   10/05 tte  1. Left ventricular ejection fraction, by estimation, is 60 to 65%. The  left ventricle has normal function. The left ventricle has no regional  wall motion abnormalities. There is moderate left ventricular hypertrophy.  Left ventricular diastolic  parameters are consistent with Grade I diastolic dysfunction (impaired  relaxation). Elevated left ventricular end-diastolic pressure. The E/e' is  17.   2. Right ventricular systolic function is low normal. The right  ventricular size is normal. There is normal pulmonary artery systolic  pressure. The estimated right ventricular systolic pressure is 26.2 mmHg.   3. Left atrial size was moderately dilated.   4. Right atrial size was mildly dilated.   5. The mitral valve is abnormal. Trivial mitral valve regurgitation.  Moderate mitral annular calcification.   6. Prostetic valve EOA is 0.46 cm2 - the AT is prolonged at 123 msec, jet  contour is rounded, DI is 0.15 -features consistent with severe prosthetic  valve stenosis. The aortic valve has been repaired/replaced. Aortic valve  regurgitation is not  visualized. Critical aortic stenosis. There is a 29 mm Sapien prosthetic  (TAVR) valve present in the aortic position. Procedure Date: 08/09/13.  Echo findings are consistent with stenosis and probable  vegetation of the  aortic prosthesis. Aortic valve  area, by VTI measures 0.46 cm. Aortic valve mean gradient measures 93.2  mmHg. Aortic valve Vmax measures 5.82 m/s.   7. The inferior vena cava is normal in size with greater than 50%  respiratory variability, suggesting right atrial pressure of 3 mmHg.   Comparison(s): Changes from prior study are noted. 08/09/2014: LVEF  55-60%, s/p TAVR with mean gradient 8 mmHg.   Conclusion(s)/Recommendation(s): Findings concerning for aortic valve  vegetation of the bioprothesis, would recommend a Transesophageal  Echocardiogram for clarification. Due to the above findings, cardiology  consult is recommended

## 2021-05-31 NOTE — Anesthesia Preprocedure Evaluation (Addendum)
Anesthesia Evaluation  Patient identified by MRN, date of birth, ID band Patient awake    Reviewed: Allergy & Precautions, NPO status , Patient's Chart, lab work & pertinent test results  Airway Mallampati: II  TM Distance: >3 FB Neck ROM: Full    Dental  (+) Poor Dentition, Missing, Chipped, Dental Advisory Given, Loose,    Pulmonary COPD,  COPD inhaler, former smoker,  pHTN    + decreased breath sounds      Cardiovascular +CHF  + Valvular Problems/Murmurs (S/P TAVR) AS  Rhythm:Regular Rate:Normal + Systolic murmurs    Neuro/Psych Anxiety negative neurological ROS     GI/Hepatic negative GI ROS, (+)     substance abuse (on CIWA)  alcohol use,   Endo/Other  Hypothyroidism   Renal/GU negative Renal ROS  negative genitourinary   Musculoskeletal negative musculoskeletal ROS (+)   Abdominal (+)   Bowel sounds: normal.  Peds  Hematology negative hematology ROS (+)   Anesthesia Other Findings   Reproductive/Obstetrics                            Anesthesia Physical Anesthesia Plan  ASA: 4  Anesthesia Plan: MAC   Post-op Pain Management:    Induction: Intravenous  PONV Risk Score and Plan: 2  Airway Management Planned: Simple Face Mask, Natural Airway and Nasal Cannula  Additional Equipment: None  Intra-op Plan:   Post-operative Plan:   Informed Consent: I have reviewed the patients History and Physical, chart, labs and discussed the procedure including the risks, benefits and alternatives for the proposed anesthesia with the patient or authorized representative who has indicated his/her understanding and acceptance.     Dental advisory given  Plan Discussed with: CRNA  Anesthesia Plan Comments: (ECHO 05/29/21: 1. Left ventricular ejection fraction, by estimation, is 60 to 65%. The  left ventricle has normal function. The left ventricle has no regional  wall motion  abnormalities. There is moderate left ventricular hypertrophy.  Left ventricular diastolic  parameters are consistent with Grade I diastolic dysfunction (impaired  relaxation). Elevated left ventricular end-diastolic pressure. The E/e' is  80.  2. Right ventricular systolic function is low normal. The right  ventricular size is normal. There is normal pulmonary artery systolic  pressure. The estimated right ventricular systolic pressure is 65.4 mmHg.  3. Left atrial size was moderately dilated.  4. Right atrial size was mildly dilated.  5. The mitral valve is abnormal. Trivial mitral valve regurgitation.  Moderate mitral annular calcification.  6. Prostetic valve EOA is 0.46 cm2 - the AT is prolonged at 123 msec, jet  contour is rounded, DI is 0.15 -features consistent with severe prosthetic  valve stenosis. The aortic valve has been repaired/replaced. Aortic valve  regurgitation is not  visualized. Critical aortic stenosis. There is a 29 mm Sapien prosthetic  (TAVR) valve present in the aortic position. Procedure Date: 08/09/13.  Echo findings are consistent with stenosis and probable vegetation of the  aortic prosthesis. Aortic valve  area, by VTI measures 0.46 cm. Aortic valve mean gradient measures 93.2  mmHg. Aortic valve Vmax measures 5.82 m/s.  7. The inferior vena cava is normal in size with greater than 50%  respiratory variability, suggesting right atrial pressure of 3 mmHg. Lab Results      Component                Value  Date                      WBC                      24.6 (H)            05/31/2021                HGB                      11.7 (L)            05/31/2021                HCT                      34.8 (L)            05/31/2021                MCV                      99.4                05/31/2021                PLT                      261                 05/31/2021           Lab Results      Component                Value                Date                      NA                       138                 05/31/2021                K                        3.8                 05/31/2021                CO2                      26                  05/31/2021                GLUCOSE                  163 (H)             05/31/2021                BUN                      16                  05/31/2021  CREATININE               0.83                05/31/2021                CALCIUM                  8.1 (L)             05/31/2021                GFRNONAA                 >60                 05/31/2021          )       Anesthesia Quick Evaluation

## 2021-05-31 NOTE — Interval H&P Note (Signed)
History and Physical Interval Note:  05/31/2021 12:12 PM  Cathy Moody  has presented today for surgery, with the diagnosis of SEVERE AORTIC STENOSIS.  The various methods of treatment have been discussed with the patient and family. After consideration of risks, benefits and other options for treatment, the patient has consented to  Procedure(s): TRANSESOPHAGEAL ECHOCARDIOGRAM (TEE) (N/A) as a surgical intervention.  The patient's history has been reviewed, patient examined, no change in status, stable for surgery.  I have reviewed the patient's chart and labs.  Questions were answered to the patient's satisfaction.     Baeleigh Devincent

## 2021-05-31 NOTE — Transfer of Care (Signed)
Immediate Anesthesia Transfer of Care Note  Patient: Cathy Moody  Procedure(s) Performed: TRANSESOPHAGEAL ECHOCARDIOGRAM (TEE)  Patient Location: Endoscopy Unit  Anesthesia Type:MAC  Level of Consciousness: awake, oriented and patient cooperative  Airway & Oxygen Therapy: Patient Spontanous Breathing and Patient connected to nasal cannula oxygen  Post-op Assessment: Report given to RN and Post -op Vital signs reviewed and stable  Post vital signs: Reviewed  Last Vitals:  Vitals Value Taken Time  BP    Temp    Pulse    Resp    SpO2      Last Pain:  Vitals:   05/31/21 1200  TempSrc: Temporal  PainSc: 0-No pain         Complications: No notable events documented.

## 2021-05-31 NOTE — Anesthesia Procedure Notes (Signed)
Procedure Name: MAC Date/Time: 05/31/2021 1:30 PM Performed by: Jenne Campus, CRNA Pre-anesthesia Checklist: Patient identified, Emergency Drugs available, Suction available and Patient being monitored Oxygen Delivery Method: Simple face mask

## 2021-05-31 NOTE — Progress Notes (Signed)
Progress Note  Patient Name: Cathy Moody Date of Encounter: 05/31/2021  [facility] Cardiologist: Dietrich Pates, MD   Subjective   Pt nervous about TEE. All questions answered.  Inpatient Medications    Scheduled Meds:  aspirin EC  81 mg Oral Daily   enoxaparin (LOVENOX) injection  40 mg Subcutaneous Q24H   levothyroxine  112 mcg Oral Q0600   LORazepam  0-4 mg Intravenous Q12H   Or   LORazepam  0-4 mg Oral Q12H   mometasone-formoterol  2 puff Inhalation BID   thiamine  100 mg Oral Daily   Or   thiamine  100 mg Intravenous Daily   Continuous Infusions:  cefTRIAXone (ROCEPHIN)  IV 2 g (05/30/21 1836)   PRN Meds: acetaminophen **OR** acetaminophen, albuterol, albuterol, doxylamine (Sleep), HYDROcodone-acetaminophen, ipratropium-albuterol   Vital Signs    Vitals:   05/30/21 1926 05/30/21 2028 05/31/21 0531 05/31/21 0725  BP: 125/69  134/83   Pulse: 96  (!) 109   Resp: 20  16   Temp: 98.3 F (36.8 C)  97.9 F (36.6 C)   TempSrc:      SpO2: (!) 89% 92% (!) 85% 92%  Weight:      Height:        Intake/Output Summary (Last 24 hours) at 05/31/2021 1010 Last data filed at 05/30/2021 1325 Gross per 24 hour  Intake 240 ml  Output --  Net 240 ml   Last 3 Weights 05/29/2021 05/29/2021 03/23/2018  Weight (lbs) 217 lb 6 oz 198 lb 214 lb 3.2 oz  Weight (kg) 98.6 kg 89.812 kg 97.16 kg      Telemetry    N/A - Personally Reviewed  ECG    No new tracings - Personally Reviewed  Physical Exam   GEN: obese female in no acute distress.   Neck: No JVD Cardiac: RRR, 4/6 holosystolic murmur, no S2  Respiratory: Clear to auscultation bilaterally. GI: Soft, nontender, non-distended  MS: No edema; No deformity. Neuro:  Nonfocal  Psych: Normal affect   Labs    High Sensitivity Troponin:  No results for input(s): TROPONINIHS in the last 720 hours.   Chemistry Recent Labs  Lab 05/28/21 1941 05/29/21 0639 05/30/21 0457 05/31/21 0445  NA 136 142 144 138  K 3.4* 4.2  4.3 3.8  CL 100 106 113* 105  CO2 24 26 25 26   GLUCOSE 170* 268* 216* 163*  BUN 17 14 14 16   CREATININE 1.29* 0.92 0.89 0.83  CALCIUM 8.7* 8.7* 9.1 8.1*  MG 1.6* 2.5* 2.1  --   PROT 7.3 6.8  --   --   ALBUMIN 3.8 3.1*  --   --   AST 26 25  --   --   ALT 19 19  --   --   ALKPHOS 95 73  --   --   BILITOT 1.4* 0.4  --   --   GFRNONAA 45* >60 >60 >60  ANIONGAP 12 10 6 7     Lipids No results for input(s): CHOL, TRIG, HDL, LABVLDL, LDLCALC, CHOLHDL in the last 168 hours.  Hematology Recent Labs  Lab 05/29/21 0639 05/30/21 0457 05/31/21 0445  WBC 34.0* 31.4* 24.6*  RBC 3.48* 3.52* 3.50*  HGB 11.8* 11.8* 11.7*  HCT 34.5* 35.2* 34.8*  MCV 99.1 100.0 99.4  MCH 33.9 33.5 33.4  MCHC 34.2 33.5 33.6  RDW 12.7 12.8 12.9  PLT 269 265 261   Thyroid  Recent Labs  Lab 05/29/21 0639  TSH 10.760*  BNPNo results for input(s): BNP, PROBNP in the last 168 hours.  DDimer No results for input(s): DDIMER in the last 168 hours.   Radiology    ECHOCARDIOGRAM COMPLETE  Result Date: 05/29/2021    ECHOCARDIOGRAM REPORT   Patient Name:   [name] Date of Exam: 05/29/2021 Medical Rec #:  [id number]     Height:       60.0 in Accession #:    [id number]    Weight:       217.4 lb Date of Birth:  04/07/1951      BSA:          1.934 m Patient Age:    70 years      BP:           141/85 mmHg Patient Gender: F             HR:           81 bpm. Exam Location:  Inpatient Procedure: 2D Echo, Color Doppler and Cardiac Doppler Indications:    Fever R50.9  History:        Patient has prior history of Echocardiogram examinations, most                 recent 08/09/2014. CHF; Risk Factors:ETOH.                 Aortic Valve: 29 mm Sapien prosthetic, stented (TAVR) valve is                 present in the aortic position. Procedure Date: 08/09/13.  Sonographer:    Eulah Pont RDCS Referring Phys: Consepcion Hearing ANASTASSIA DOUTOVA IMPRESSIONS  1. Left ventricular ejection fraction, by estimation, is 60 to 65%. The left  ventricle has normal function. The left ventricle has no regional wall motion abnormalities. There is moderate left ventricular hypertrophy. Left ventricular diastolic parameters are consistent with Grade I diastolic dysfunction (impaired relaxation). Elevated left ventricular end-diastolic pressure. The E/e' is 17.  2. Right ventricular systolic function is low normal. The right ventricular size is normal. There is normal pulmonary artery systolic pressure. The estimated right ventricular systolic pressure is 26.2 mmHg.  3. Left atrial size was moderately dilated.  4. Right atrial size was mildly dilated.  5. The mitral valve is abnormal. Trivial mitral valve regurgitation. Moderate mitral annular calcification.  6. Prostetic valve EOA is 0.46 cm2 - the AT is prolonged at 123 msec, jet contour is rounded, DI is 0.15 -features consistent with severe prosthetic valve stenosis. The aortic valve has been repaired/replaced. Aortic valve regurgitation is not visualized. Critical aortic stenosis. There is a 29 mm Sapien prosthetic (TAVR) valve present in the aortic position. Procedure Date: 08/09/13. Echo findings are consistent with stenosis and probable vegetation of the aortic prosthesis. Aortic valve area, by VTI measures 0.46 cm. Aortic valve mean gradient measures 93.2 mmHg. Aortic valve Vmax measures 5.82 m/s.  7. The inferior vena cava is normal in size with greater than 50% respiratory variability, suggesting right atrial pressure of 3 mmHg. Comparison(s): Changes from prior study are noted. 08/09/2014: LVEF 55-60%, s/p TAVR with mean gradient 8 mmHg. Conclusion(s)/Recommendation(s): Findings concerning for aortic valve vegetation of the bioprothesis, would recommend a Transesophageal Echocardiogram for clarification. Due to the above findings, cardiology consult is recommended. Critical findings reported to Dr. Maryfrances Bunnell and acknowledged at 2:30 pm. FINDINGS  Left Ventricle: Left ventricular ejection fraction, by  estimation, is 60 to 65%. The left ventricle has normal function. The left ventricle has  no regional wall motion abnormalities. The left ventricular internal cavity size was normal in size. There is  moderate left ventricular hypertrophy. Left ventricular diastolic parameters are consistent with Grade I diastolic dysfunction (impaired relaxation). Elevated left ventricular end-diastolic pressure. The E/e' is 90. Right Ventricle: The right ventricular size is normal. No increase in right ventricular wall thickness. Right ventricular systolic function is low normal. There is normal pulmonary artery systolic pressure. The tricuspid regurgitant velocity is 2.41 m/s,  and with an assumed right atrial pressure of 3 mmHg, the estimated right ventricular systolic pressure is 26.2 mmHg. Left Atrium: Left atrial size was moderately dilated. Right Atrium: Right atrial size was mildly dilated. Pericardium: There is no evidence of pericardial effusion. Mitral Valve: The mitral valve is abnormal. Moderate mitral annular calcification. Trivial mitral valve regurgitation. Tricuspid Valve: The tricuspid valve is grossly normal. Tricuspid valve regurgitation is trivial. Aortic Valve: Prostetic valve EROA is 0.46 cm2 - the AT is prolonged at 123 msec, jet contour is rounded -features consistent with prostetic valve stenosis. The aortic valve has been repaired/replaced. Aortic valve regurgitation is mild. Critical aortic stenosis. Aortic valve mean gradient measures 93.2 mmHg. Aortic valve peak gradient measures 135.7 mmHg. Aortic valve area, by VTI measures 0.46 cm. There is a 29 mm Sapien prosthetic, stented (TAVR) valve present in the aortic position. Procedure Date:  08/09/13. Pulmonic Valve: The pulmonic valve was normal in structure. Pulmonic valve regurgitation is not visualized. Aorta: The aortic root and ascending aorta are structurally normal, with no evidence of dilitation. Venous: The inferior vena cava is normal in size  with greater than 50% respiratory variability, suggesting right atrial pressure of 3 mmHg. IAS/Shunts: No atrial level shunt detected by color flow Doppler.  LEFT VENTRICLE PLAX 2D LVIDd:         4.30 cm  Diastology LVIDs:         2.80 cm  LV e' medial:    5.96 cm/s LV PW:         1.30 cm  LV E/e' medial:  17.1 LV IVS:        1.50 cm  LV e' lateral:   6.30 cm/s LVOT diam:     2.00 cm  LV E/e' lateral: 16.2 LV SV:         68 LV SV Index:   35 LVOT Area:     3.14 cm  RIGHT VENTRICLE RV S prime:     9.48 cm/s TAPSE (M-mode): 2.1 cm LEFT ATRIUM             Index       RIGHT ATRIUM           Index LA diam:        4.30 cm 2.22 cm/m  RA Area:     21.70 cm LA Vol (A2C):   72.5 ml 37.49 ml/m RA Volume:   67.60 ml  34.96 ml/m LA Vol (A4C):   75.8 ml 39.20 ml/m LA Biplane Vol: 74.4 ml 38.47 ml/m  AORTIC VALVE AV Area (Vmax):    0.52 cm AV Area (Vmean):   0.46 cm AV Area (VTI):     0.46 cm AV Vmax:           582.40 cm/s AV Vmean:          463.200 cm/s AV VTI:            1.480 m AV Peak Grad:      135.7 mmHg AV Mean Grad:  93.2 mmHg LVOT Vmax:         95.60 cm/s LVOT Vmean:        67.400 cm/s LVOT VTI:          0.217 m LVOT/AV VTI ratio: 0.15  AORTA Ao Asc diam: 3.10 cm MITRAL VALVE                TRICUSPID VALVE MV Area (PHT): 4.15 cm     TR Peak grad:   23.2 mmHg MV Decel Time: 183 msec     TR Vmax:        241.00 cm/s MV E velocity: 102.00 cm/s MV A velocity: 94.10 cm/s   SHUNTS MV E/A ratio:  1.08         Systemic VTI:  0.22 m                             Systemic Diam: 2.00 cm Zoila Shutter MD Electronically signed by Zoila Shutter MD Signature Date/Time: 05/29/2021/2:38:00 PM    Final     Cardiac Studies   Echo 05/29/21:  1. Left ventricular ejection fraction, by estimation, is 60 to 65%. The  left ventricle has normal function. The left ventricle has no regional  wall motion abnormalities. There is moderate left ventricular hypertrophy.  Left ventricular diastolic  parameters are consistent with Grade  I diastolic dysfunction (impaired  relaxation). Elevated left ventricular end-diastolic pressure. The E/e' is  17.   2. Right ventricular systolic function is low normal. The right  ventricular size is normal. There is normal pulmonary artery systolic  pressure. The estimated right ventricular systolic pressure is 26.2 mmHg.   3. Left atrial size was moderately dilated.   4. Right atrial size was mildly dilated.   5. The mitral valve is abnormal. Trivial mitral valve regurgitation.  Moderate mitral annular calcification.   6. Prostetic valve EOA is 0.46 cm2 - the AT is prolonged at 123 msec, jet  contour is rounded, DI is 0.15 -features consistent with severe prosthetic  valve stenosis. The aortic valve has been repaired/replaced. Aortic valve  regurgitation is not  visualized. Critical aortic stenosis. There is a 29 mm Sapien prosthetic  (TAVR) valve present in the aortic position. Procedure Date: 08/09/13.  Echo findings are consistent with stenosis and probable vegetation of the  aortic prosthesis. Aortic valve  area, by VTI measures 0.46 cm. Aortic valve mean gradient measures 93.2  mmHg. Aortic valve Vmax measures 5.82 m/s.   7. The inferior vena cava is normal in size with greater than 50%  respiratory variability, suggesting right atrial pressure of 3 mmHg.   Patient Profile     70 y.o. female  with a hx of AS s/p TAVR in 2014, chronic systolic heart failure, NICM, tobacco abuse, COPD, and morbid obesity who is being seen 05/30/2021 for the evaluation of AV vegetation  Assessment & Plan    Aortic valve vegetation S/P TAVR - TTE with severe prosthetic valve stenosis wit probably vegetation on the valve - mean gradient measured at 93.2 mmHg an dAV area by VTI 0.46 cm2 - awaiting TEE today - all questions answered   Chronic systolic heart failure - improved after TAVR - heart cath in 2015 negative for obstructive disease - does not appear volume up   Acute respiratory  failure COPD Smoker - encouraged cessation - per primary   AKI - resolved    [facility] has been requested to perform  a transesophageal echocardiogram on [address] [place] for vegetation.  After careful review of history and examination, the risks and benefits of transesophageal echocardiogram have been explained including risks of esophageal damage, perforation (1:10,000 risk), bleeding, pharyngeal hematoma as well as other potential complications associated with conscious sedation including aspiration, arrhythmia, respiratory failure and death. Alternatives to treatment were discussed, questions were answered. Patient is willing to proceed.          For questions or updates, please contact [facility] Please consult [email or web address] for contact info under        Signed, Marcelino Duster, PA  05/31/2021, 10:10 AM

## 2021-05-31 NOTE — Progress Notes (Signed)
1030- Pt sitting on side of bed talking with pt's mother and son, Casimiro Needle. Pt is alert and oriented x4, anxious regarding procedure this am. #20g IV in LFA leaking. IV removed with cath. Intact. #20g IV placed in different site in LFA.   1102- Pt extremely anxious and agitated. Medicated with Ativan 2mg  ivp.  1115- Pt transported to [facility] via [facility] team members. NAD noted.  1630- Report received from [facility] in [facility] and pt back to room via [facility]. NAD noted. #20g IV in LFA reddened and edematous. IV removed without difficulty.   1739- Pt medicated with hydrocodone x2 tabs for c/o back pain. Dr. [facility] in to see pt and made aware of new rash to pt's back.   1839- New IV #22g placed in left hand x2 attempts. PCN G started @ 41.3ml/hr.  2015- Assisted pt oob to br per request. Aware to pull string for assistance back to bed.

## 2021-05-31 NOTE — Progress Notes (Signed)
Patient with exp wheeze, labored breathing, nebulizer treatment given as ordered

## 2021-05-31 NOTE — Progress Notes (Signed)
Patient ID: Cathy Moody, female   DOB: 1951/06/06, 70 y.o.   MRN: [id number]  PROGRESS NOTE    Cathy Moody  NWG:[id number] DOB: 1951-05-15 DOA: 05/28/2021 PCP: [name], MD   Brief Narrative:  70 year old female with history of COPD not on home oxygen, alcohol use, pulmonary hypertension, obesity and TAVR presented with fever.  On presentation, CT of the chest showed no PE or infiltrates.  CT abdomen and pelvis was unremarkable.  UA was unremarkable.  She was started on broad-spectrum antibiotics.  Blood cultures are growing gram-positive cocci; echo showed severe aortic stenosis and possible vegetation.  Cardiology was consulted for possible TEE.  Assessment & Plan:   Severe sepsis: Present on admission Streptococcus mitis/oralis bacteremia Possible aortic valve vegetation/infective endocarditis  -Presented with fever, tachypnea, tachycardia and hypotension and acute metabolic encephalopathy possibly from bacteremia and endocarditis -Blood cultures growing Streptococcus mitis/oralis.  Consult ID -TTE showed possible aortic valve vegetation.  Continue Rocephin.  Cardiology following and plan for TEE today.  Leukocytosis -Improving  Acute respiratory failure with hypoxia -Oxygen at home.  Required supplemental oxygen on presentation.  Currently on room air.  Acute metabolic encephalopathy -Possibly from sepsis.  Currently resolved.  Mental status currently stable.  Status post TAVR -Cardiology following.  Hypomagnesemia Hypokalemia -Resolved  Alcohol abuse -No alcohol withdrawal at this time.  Currently on CIWA protocol along with thiamine and folate  Hypothyroidism -Continue levothyroxine  Pulmonary hypertension -Cardiology following  Morbid obesity -Outpatient follow-up  COPD -Stable.  Continue Dulera and as needed bronchodilators  Abnormal TSH -Repeat TSH after acute illness   DVT prophylaxis: Lovenox Code Status: Full Family Communication: Mother and  son at bedside Disposition Plan: Status is: Inpatient  Remains inpatient appropriate because:Inpatient level of care appropriate due to severity of illness  Dispo: The patient is from: Home              Anticipated d/c is to: Home              Patient currently is not medically stable to d/c.   Difficult to place patient No  Consultants: Cardiology.  Consulted ID.  Procedures: 2D echo  Antimicrobials:  Anti-infectives (From admission, onward)    Start     Dose/Rate Route Frequency Ordered Stop   05/30/21 1400  vancomycin (VANCOCIN) IVPB 1000 mg/200 mL premix  Status:  Discontinued        1,000 mg 200 mL/hr over 60 Minutes Intravenous Every 36 hours 05/29/21 0250 05/29/21 0956   05/29/21 2200  vancomycin (VANCOCIN) IVPB 1000 mg/200 mL premix  Status:  Discontinued        1,000 mg 200 mL/hr over 60 Minutes Intravenous Every 24 hours 05/29/21 0956 05/29/21 1544   05/29/21 1800  cefTRIAXone (ROCEPHIN) 2 g in sodium chloride 0.9 % 100 mL IVPB        2 g 200 mL/hr over 30 Minutes Intravenous Every 24 hours 05/29/21 1544     05/29/21 1400  ceFEPIme (MAXIPIME) 2 g in sodium chloride 0.9 % 100 mL IVPB  Status:  Discontinued        2 g 200 mL/hr over 30 Minutes Intravenous Every 12 hours 05/29/21 0250 05/29/21 1000   05/29/21 1100  ceFEPIme (MAXIPIME) 2 g in sodium chloride 0.9 % 100 mL IVPB  Status:  Discontinued        2 g 200 mL/hr over 30 Minutes Intravenous Every 12 hours 05/29/21 1000 05/29/21 1544   05/29/21 0115  metroNIDAZOLE (FLAGYL) IVPB  500 mg  Status:  Discontinued        500 mg 100 mL/hr over 60 Minutes Intravenous Every 12 hours 05/29/21 0102 05/30/21 1242   05/29/21 0115  vancomycin (VANCOCIN) IVPB 1000 mg/200 mL premix        1,000 mg 200 mL/hr over 60 Minutes Intravenous  Once 05/29/21 0110 05/29/21 0317   05/29/21 0115  ceFEPIme (MAXIPIME) 2 g in sodium chloride 0.9 % 100 mL IVPB        2 g 200 mL/hr over 30 Minutes Intravenous  Once 05/29/21 0110 05/29/21 0317    05/28/21 2015  cefTRIAXone (ROCEPHIN) 2 g in sodium chloride 0.9 % 100 mL IVPB  Status:  Discontinued        2 g 200 mL/hr over 30 Minutes Intravenous Every 24 hours 05/28/21 2000 05/29/21 0110   05/28/21 2015  azithromycin (ZITHROMAX) 500 mg in sodium chloride 0.9 % 250 mL IVPB  Status:  Discontinued        500 mg 250 mL/hr over 60 Minutes Intravenous Every 24 hours 05/28/21 2000 05/29/21 0110         Subjective: Patient seen and examined at bedside.  Denies worsening shortness breath, chest pain, nausea, vomiting or fevers.  Objective: Vitals:   05/30/21 2028 05/31/21 0531 05/31/21 0725 05/31/21 1123  BP:  134/83  (!) 154/99  Pulse:  (!) 109  95  Resp:  16  20  Temp:  97.9 F (36.6 C)  98.3 F (36.8 C)  TempSrc:    Oral  SpO2: 92% (!) 85% 92% 91%  Weight:      Height:        Intake/Output Summary (Last 24 hours) at 05/31/2021 1202 Last data filed at 05/30/2021 1325 Gross per 24 hour  Intake 240 ml  Output --  Net 240 ml   Filed Weights   05/29/21 0123 05/29/21 0443  Weight: 89.8 kg 98.6 kg    Examination:  General exam: Appears calm and comfortable.  Currently on room air Respiratory system: Bilateral decreased breath sounds at bases Cardiovascular system: S1 & S2 heard, intermittently tachycardic gastrointestinal system: Abdomen is nondistended, soft and nontender. Normal bowel sounds heard. Extremities: No cyanosis, clubbing; trace lower extremity edema Central nervous system: Alert and oriented. No focal neurological deficits. Moving extremities Skin: No rashes, lesions or ulcers Psychiatry: Affect is mostly flat.   Data Reviewed: I have personally reviewed following labs and imaging studies  CBC: Recent Labs  Lab 05/28/21 1941 05/29/21 0639 05/30/21 0457 05/31/21 0445  WBC 33.6* 34.0* 31.4* 24.6*  NEUTROABS 29.9* 32.0*  --   --   HGB 13.3 11.8* 11.8* 11.7*  HCT 39.8 34.5* 35.2* 34.8*  MCV 98.0 99.1 100.0 99.4  PLT 288 269 265 261   Basic  Metabolic Panel: Recent Labs  Lab 05/28/21 1941 05/29/21 0639 05/30/21 0457 05/31/21 0445  NA 136 142 144 138  K 3.4* 4.2 4.3 3.8  CL 100 106 113* 105  CO2 24 26 25 26   GLUCOSE 170* 268* 216* 163*  BUN 17 14 14 16   CREATININE 1.29* 0.92 0.89 0.83  CALCIUM 8.7* 8.7* 9.1 8.1*  MG 1.6* 2.5* 2.1  --   PHOS  --  4.0  --   --    GFR: Estimated Creatinine Clearance: 67.4 mL/min (by C-G formula based on SCr of 0.83 mg/dL). Liver Function Tests: Recent Labs  Lab 05/28/21 1941 05/29/21 0639  AST 26 25  ALT 19 19  ALKPHOS 95 73  BILITOT  1.4* 0.4  PROT 7.3 6.8  ALBUMIN 3.8 3.1*   No results for input(s): LIPASE, AMYLASE in the last 168 hours. Recent Labs  Lab 05/28/21 2330  AMMONIA 32   Coagulation Profile: Recent Labs  Lab 05/28/21 1941  INR 1.1   Cardiac Enzymes: Recent Labs  Lab 05/28/21 1941  CKTOTAL 62   BNP (last 3 results) No results for input(s): PROBNP in the last 8760 hours. HbA1C: Recent Labs    05/30/21 0457  HGBA1C 6.3*   CBG: Recent Labs  Lab 05/29/21 1855 05/29/21 2101 05/30/21 0735 05/30/21 1208  GLUCAP 214* 299* 188* 199*   Lipid Profile: No results for input(s): CHOL, HDL, LDLCALC, TRIG, CHOLHDL, LDLDIRECT in the last 72 hours. Thyroid Function Tests: Recent Labs    05/29/21 0639  TSH 10.760*   Anemia Panel: No results for input(s): VITAMINB12, FOLATE, FERRITIN, TIBC, IRON, RETICCTPCT in the last 72 hours. Sepsis Labs: Recent Labs  Lab 05/28/21 1941 05/29/21 0000  LATICACIDVEN 1.6 1.5    Recent Results (from the past 240 hour(s))  Resp Panel by RT-PCR (Flu A&B, Covid) Nasopharyngeal Swab     Status: None   Collection Time: 05/28/21  7:41 PM   Specimen: Nasopharyngeal Swab; Nasopharyngeal(NP) swabs in vial transport medium  Result Value Ref Range Status   SARS Coronavirus 2 by RT PCR NEGATIVE NEGATIVE Final    Comment: (NOTE) SARS-CoV-2 target nucleic acids are NOT DETECTED.  The SARS-CoV-2 RNA is generally detectable  in upper respiratory specimens during the acute phase of infection. The lowest concentration of SARS-CoV-2 viral copies this assay can detect is 138 copies/mL. A negative result does not preclude SARS-Cov-2 infection and should not be used as the sole basis for treatment or other patient management decisions. A negative result may occur with  improper specimen collection/handling, submission of specimen other than nasopharyngeal swab, presence of viral mutation(s) within the areas targeted by this assay, and inadequate number of viral copies(<138 copies/mL). A negative result must be combined with clinical observations, patient history, and epidemiological information. The expected result is Negative.  Fact Sheet for Patients:  BloggerCourse.com  Fact Sheet for Healthcare Providers:  SeriousBroker.it  This test is no t yet approved or cleared by the [place] FDA and  has been authorized for detection and/or diagnosis of SARS-CoV-2 by FDA under an Emergency Use Authorization (EUA). This EUA will remain  in effect (meaning this test can be used) for the duration of the COVID-19 declaration under Section 564(b)(1) of the Act, 21 U.S.C.section 360bbb-3(b)(1), unless the authorization is terminated  or revoked sooner.       Influenza A by PCR NEGATIVE NEGATIVE Final   Influenza B by PCR NEGATIVE NEGATIVE Final    Comment: (NOTE) The Xpert Xpress SARS-CoV-2/FLU/RSV plus assay is intended as an aid in the diagnosis of influenza from Nasopharyngeal swab specimens and should not be used as a sole basis for treatment. Nasal washings and aspirates are unacceptable for Xpert Xpress SARS-CoV-2/FLU/RSV testing.  Fact Sheet for Patients: BloggerCourse.com  Fact Sheet for Healthcare Providers: SeriousBroker.it  This test is not yet approved or cleared by the [place] FDA and has  been authorized for detection and/or diagnosis of SARS-CoV-2 by FDA under an Emergency Use Authorization (EUA). This EUA will remain in effect (meaning this test can be used) for the duration of the COVID-19 declaration under Section 564(b)(1) of the Act, 21 U.S.C. section 360bbb-3(b)(1), unless the authorization is terminated or revoked.  Performed at [facility],  [address] [place], [place] [address]   Blood Culture (routine x 2)     Status: Abnormal   Collection Time: 05/28/21  7:41 PM   Specimen: BLOOD  Result Value Ref Range Status   Specimen Description   Final    BLOOD BLOOD RIGHT FOREARM Performed at [facility], [address], [place], [place] [address]    Special Requests   Final    BOTTLES DRAWN AEROBIC AND ANAEROBIC Blood Culture results may not be optimal due to an excessive volume of blood received in culture bottles Performed at [facility], [address] [place], [place] [address]    Culture  Setup Time   Final    GRAM POSITIVE COCCI IN CHAINS IN BOTH AEROBIC AND ANAEROBIC BOTTLES CRITICAL VALUE NOTED.  VALUE IS CONSISTENT WITH PREVIOUSLY REPORTED AND CALLED VALUE.    Culture (A)  Final    STREPTOCOCCUS MITIS/ORALIS SUSCEPTIBILITIES PERFORMED ON PREVIOUS CULTURE WITHIN THE LAST 5 DAYS. Performed at [facility], [address], [place], [place] [address]    Report Status 05/31/2021 FINAL  Final  Blood Culture (routine x 2)     Status: Abnormal   Collection Time: 05/28/21  7:46 PM   Specimen: BLOOD LEFT FOREARM  Result Value Ref Range Status   Specimen Description   Final    BLOOD LEFT FOREARM Performed at [facility], [address], [place], [place] [address]    Special Requests   Final    BOTTLES DRAWN AEROBIC AND ANAEROBIC Blood Culture results may not be optimal due to an excessive volume of blood received in culture bottles Performed at [facility], [address] [place], [place] [address]    Culture  Setup Time   Final    GRAM POSITIVE COCCI IN CHAINS IN BOTH AEROBIC AND ANAEROBIC BOTTLES CRITICAL RESULT CALLED TO, READ BACK BY AND VERIFIED WITH: PHARMD CHRISTINE S. [phone] FCP Performed at [facility], [address], [place], [place] [address]    Culture STREPTOCOCCUS MITIS/ORALIS (A)  Final   Report Status 05/31/2021 FINAL  Final   Organism ID, Bacteria STREPTOCOCCUS MITIS/ORALIS  Final      Susceptibility   Streptococcus mitis/oralis - MIC*    TETRACYCLINE 0.5 SENSITIVE Sensitive     VANCOMYCIN 0.5 SENSITIVE Sensitive     CLINDAMYCIN <=0.25 SENSITIVE Sensitive     PENICILLIN Value in next row Sensitive      SENSITIVEMIC =0.06S    * STREPTOCOCCUS MITIS/ORALIS  Blood Culture ID Panel (Reflexed)     Status: Abnormal   Collection Time: 05/28/21  7:46 PM  Result Value Ref Range Status   Enterococcus faecalis NOT DETECTED NOT DETECTED Final   Enterococcus Faecium NOT DETECTED NOT DETECTED Final   Listeria monocytogenes NOT DETECTED NOT DETECTED Final   Staphylococcus species NOT DETECTED NOT DETECTED Final   Staphylococcus aureus (BCID) NOT DETECTED NOT DETECTED Final   Staphylococcus epidermidis NOT DETECTED NOT DETECTED Final   Staphylococcus lugdunensis NOT DETECTED NOT DETECTED Final   Streptococcus species DETECTED (A) NOT DETECTED Final    Comment: Not Enterococcus species, Streptococcus agalactiae, Streptococcus pyogenes, or Streptococcus pneumoniae. CRITICAL RESULT CALLED TO, READ BACK BY AND VERIFIED WITH: PHARMD CHRISTINE S. [id number] FCP    Streptococcus agalactiae NOT DETECTED NOT DETECTED Final   Streptococcus pneumoniae NOT DETECTED NOT DETECTED Final   Streptococcus pyogenes NOT DETECTED NOT DETECTED Final   A.calcoaceticus-baumannii NOT DETECTED NOT DETECTED  Final   Bacteroides fragilis NOT DETECTED NOT DETECTED Final   Enterobacterales NOT DETECTED NOT DETECTED Final   Enterobacter cloacae  complex NOT DETECTED NOT DETECTED Final   Escherichia coli NOT DETECTED NOT DETECTED Final   Klebsiella aerogenes NOT DETECTED NOT DETECTED Final   Klebsiella oxytoca NOT DETECTED NOT DETECTED Final   Klebsiella pneumoniae NOT DETECTED NOT DETECTED Final   Proteus species NOT DETECTED NOT DETECTED Final   Salmonella species NOT DETECTED NOT DETECTED Final   Serratia marcescens NOT DETECTED NOT DETECTED Final   Haemophilus influenzae NOT DETECTED NOT DETECTED Final   Neisseria meningitidis NOT DETECTED NOT DETECTED Final   Pseudomonas aeruginosa NOT DETECTED NOT DETECTED Final   Stenotrophomonas maltophilia NOT DETECTED NOT DETECTED Final   Candida albicans NOT DETECTED NOT DETECTED Final   Candida auris NOT DETECTED NOT DETECTED Final   Candida glabrata NOT DETECTED NOT DETECTED Final   Candida krusei NOT DETECTED NOT DETECTED Final   Candida parapsilosis NOT DETECTED NOT DETECTED Final   Candida tropicalis NOT DETECTED NOT DETECTED Final   Cryptococcus neoformans/gattii NOT DETECTED NOT DETECTED Final    Comment: Performed at [facility], [address], [place], [place] [address]  Urine Culture     Status: None   Collection Time: 05/28/21 10:37 PM   Specimen: In/Out Cath Urine  Result Value Ref Range Status   Specimen Description   Final    IN/OUT CATH URINE Performed at [facility], [address] [place], [place] [address]    Special Requests   Final    NONE Performed at [facility], [address] [place], [place] [address]    Culture   Final    NO GROWTH Performed at [facility], [address], [place], [place] [address]    Report Status 05/29/2021 FINAL  Final  Respiratory (~20 pathogens) panel by PCR     Status: None   Collection Time: 05/28/21 11:07 PM   Specimen: Nasopharyngeal Swab; Respiratory  Result Value Ref Range Status   Adenovirus NOT DETECTED NOT DETECTED Final   Coronavirus 229E NOT DETECTED NOT  DETECTED Final    Comment: (NOTE) The Coronavirus on the Respiratory Panel, DOES NOT test for the novel  Coronavirus (2019 nCoV)    Coronavirus HKU1 NOT DETECTED NOT DETECTED Final   Coronavirus NL63 NOT DETECTED NOT DETECTED Final   Coronavirus OC43 NOT DETECTED NOT DETECTED Final   Metapneumovirus NOT DETECTED NOT DETECTED Final   Rhinovirus / Enterovirus NOT DETECTED NOT DETECTED Final   Influenza A NOT DETECTED NOT DETECTED Final   Influenza B NOT DETECTED NOT DETECTED Final   Parainfluenza Virus 1 NOT DETECTED NOT DETECTED Final   Parainfluenza Virus 2 NOT DETECTED NOT DETECTED Final   Parainfluenza Virus 3 NOT DETECTED NOT DETECTED Final   Parainfluenza Virus 4 NOT DETECTED NOT DETECTED Final   Respiratory Syncytial Virus NOT DETECTED NOT DETECTED Final   Bordetella pertussis NOT DETECTED NOT DETECTED Final   Bordetella Parapertussis NOT DETECTED NOT DETECTED Final   Chlamydophila pneumoniae NOT DETECTED NOT DETECTED Final   Mycoplasma pneumoniae NOT DETECTED NOT DETECTED Final    Comment: Performed at [facility], [address], [place], [place] [address]  MRSA Next Gen by PCR, Nasal     Status: None   Collection Time: 05/29/21  6:38 AM   Specimen: Nasal Mucosa; Nasal Swab  Result Value Ref Range Status   MRSA by PCR Next Gen  NOT DETECTED NOT DETECTED Final    Comment: (NOTE) The GeneXpert MRSA Assay (FDA approved for NASAL specimens only), is one component of a comprehensive MRSA colonization surveillance program. It is not intended to diagnose MRSA infection nor to guide or monitor treatment for MRSA infections. Test performance is not FDA approved in patients less than 13 years old. Performed at [facility], [address], [place], [place] [address]          Radiology Studies: ECHOCARDIOGRAM COMPLETE  Result Date: 05/29/2021    ECHOCARDIOGRAM REPORT   Patient Name:   SPENCER PETERKIN Date of Exam: 05/29/2021 Medical Rec #:  [id number]      Height:       60.0 in Accession #:    [id number]    Weight:       217.4 lb Date of Birth:  Oct 04, 1950      BSA:          1.934 m Patient Age:    70 years      BP:           141/85 mmHg Patient Gender: F             HR:           81 bpm. Exam Location:  Inpatient Procedure: 2D Echo, Color Doppler and Cardiac Doppler Indications:    Fever R50.9  History:        Patient has prior history of Echocardiogram examinations, most                 recent 08/09/2014. CHF; Risk Factors:ETOH.                 Aortic Valve: 29 mm Sapien prosthetic, stented (TAVR) valve is                 present in the aortic position. Procedure Date: 08/09/13.  Sonographer:    Eulah Pont RDCS Referring Phys: Consepcion Hearing ANASTASSIA DOUTOVA IMPRESSIONS  1. Left ventricular ejection fraction, by estimation, is 60 to 65%. The left ventricle has normal function. The left ventricle has no regional wall motion abnormalities. There is moderate left ventricular hypertrophy. Left ventricular diastolic parameters are consistent with Grade I diastolic dysfunction (impaired relaxation). Elevated left ventricular end-diastolic pressure. The E/e' is 17.  2. Right ventricular systolic function is low normal. The right ventricular size is normal. There is normal pulmonary artery systolic pressure. The estimated right ventricular systolic pressure is 26.2 mmHg.  3. Left atrial size was moderately dilated.  4. Right atrial size was mildly dilated.  5. The mitral valve is abnormal. Trivial mitral valve regurgitation. Moderate mitral annular calcification.  6. Prostetic valve EOA is 0.46 cm2 - the AT is prolonged at 123 msec, jet contour is rounded, DI is 0.15 -features consistent with severe prosthetic valve stenosis. The aortic valve has been repaired/replaced. Aortic valve regurgitation is not visualized. Critical aortic stenosis. There is a 29 mm Sapien prosthetic (TAVR) valve present in the aortic position. Procedure Date: 08/09/13. Echo findings are consistent with  stenosis and probable vegetation of the aortic prosthesis. Aortic valve area, by VTI measures 0.46 cm. Aortic valve mean gradient measures 93.2 mmHg. Aortic valve Vmax measures 5.82 m/s.  7. The inferior vena cava is normal in size with greater than 50% respiratory variability, suggesting right atrial pressure of 3 mmHg. Comparison(s): Changes from prior study are noted. 08/09/2014: LVEF 55-60%, s/p TAVR with mean gradient 8 mmHg. Conclusion(s)/Recommendation(s): Findings concerning for aortic valve  vegetation of the bioprothesis, would recommend a Transesophageal Echocardiogram for clarification. Due to the above findings, cardiology consult is recommended. Critical findings reported to Dr. Maryfrances Bunnell and acknowledged at 2:30 pm. FINDINGS  Left Ventricle: Left ventricular ejection fraction, by estimation, is 60 to 65%. The left ventricle has normal function. The left ventricle has no regional wall motion abnormalities. The left ventricular internal cavity size was normal in size. There is  moderate left ventricular hypertrophy. Left ventricular diastolic parameters are consistent with Grade I diastolic dysfunction (impaired relaxation). Elevated left ventricular end-diastolic pressure. The E/e' is 30. Right Ventricle: The right ventricular size is normal. No increase in right ventricular wall thickness. Right ventricular systolic function is low normal. There is normal pulmonary artery systolic pressure. The tricuspid regurgitant velocity is 2.41 m/s,  and with an assumed right atrial pressure of 3 mmHg, the estimated right ventricular systolic pressure is 26.2 mmHg. Left Atrium: Left atrial size was moderately dilated. Right Atrium: Right atrial size was mildly dilated. Pericardium: There is no evidence of pericardial effusion. Mitral Valve: The mitral valve is abnormal. Moderate mitral annular calcification. Trivial mitral valve regurgitation. Tricuspid Valve: The tricuspid valve is grossly normal. Tricuspid valve  regurgitation is trivial. Aortic Valve: Prostetic valve EROA is 0.46 cm2 - the AT is prolonged at 123 msec, jet contour is rounded -features consistent with prostetic valve stenosis. The aortic valve has been repaired/replaced. Aortic valve regurgitation is mild. Critical aortic stenosis. Aortic valve mean gradient measures 93.2 mmHg. Aortic valve peak gradient measures 135.7 mmHg. Aortic valve area, by VTI measures 0.46 cm. There is a 29 mm Sapien prosthetic, stented (TAVR) valve present in the aortic position. Procedure Date:  08/09/13. Pulmonic Valve: The pulmonic valve was normal in structure. Pulmonic valve regurgitation is not visualized. Aorta: The aortic root and ascending aorta are structurally normal, with no evidence of dilitation. Venous: The inferior vena cava is normal in size with greater than 50% respiratory variability, suggesting right atrial pressure of 3 mmHg. IAS/Shunts: No atrial level shunt detected by color flow Doppler.  LEFT VENTRICLE PLAX 2D LVIDd:         4.30 cm  Diastology LVIDs:         2.80 cm  LV e' medial:    5.96 cm/s LV PW:         1.30 cm  LV E/e' medial:  17.1 LV IVS:        1.50 cm  LV e' lateral:   6.30 cm/s LVOT diam:     2.00 cm  LV E/e' lateral: 16.2 LV SV:         68 LV SV Index:   35 LVOT Area:     3.14 cm  RIGHT VENTRICLE RV S prime:     9.48 cm/s TAPSE (M-mode): 2.1 cm LEFT ATRIUM             Index       RIGHT ATRIUM           Index LA diam:        4.30 cm 2.22 cm/m  RA Area:     21.70 cm LA Vol (A2C):   72.5 ml 37.49 ml/m RA Volume:   67.60 ml  34.96 ml/m LA Vol (A4C):   75.8 ml 39.20 ml/m LA Biplane Vol: 74.4 ml 38.47 ml/m  AORTIC VALVE AV Area (Vmax):    0.52 cm AV Area (Vmean):   0.46 cm AV Area (VTI):     0.46 cm AV Vmax:  582.40 cm/s AV Vmean:          463.200 cm/s AV VTI:            1.480 m AV Peak Grad:      135.7 mmHg AV Mean Grad:      93.2 mmHg LVOT Vmax:         95.60 cm/s LVOT Vmean:        67.400 cm/s LVOT VTI:          0.217 m LVOT/AV  VTI ratio: 0.15  AORTA Ao Asc diam: 3.10 cm MITRAL VALVE                TRICUSPID VALVE MV Area (PHT): 4.15 cm     TR Peak grad:   23.2 mmHg MV Decel Time: 183 msec     TR Vmax:        241.00 cm/s MV E velocity: 102.00 cm/s MV A velocity: 94.10 cm/s   SHUNTS MV E/A ratio:  1.08         Systemic VTI:  0.22 m                             Systemic Diam: 2.00 cm Zoila Shutter MD Electronically signed by Zoila Shutter MD Signature Date/Time: 05/29/2021/2:38:00 PM    Final         Scheduled Meds:  aspirin EC  81 mg Oral Daily   enoxaparin (LOVENOX) injection  40 mg Subcutaneous Q24H   levothyroxine  112 mcg Oral Q0600   LORazepam  0-4 mg Intravenous Q12H   Or   LORazepam  0-4 mg Oral Q12H   mometasone-formoterol  2 puff Inhalation BID   thiamine  100 mg Oral Daily   Or   thiamine  100 mg Intravenous Daily   Continuous Infusions:  cefTRIAXone (ROCEPHIN)  IV 2 g (05/30/21 1836)          Glade Lloyd, MD [facility] 05/31/2021, 12:02 PM

## 2021-06-01 DIAGNOSIS — Z952 Presence of prosthetic heart valve: Secondary | ICD-10-CM | POA: Diagnosis not present

## 2021-06-01 DIAGNOSIS — A419 Sepsis, unspecified organism: Secondary | ICD-10-CM | POA: Diagnosis not present

## 2021-06-01 DIAGNOSIS — G9341 Metabolic encephalopathy: Secondary | ICD-10-CM | POA: Diagnosis not present

## 2021-06-01 DIAGNOSIS — R6520 Severe sepsis without septic shock: Secondary | ICD-10-CM | POA: Diagnosis not present

## 2021-06-01 DIAGNOSIS — J9601 Acute respiratory failure with hypoxia: Secondary | ICD-10-CM | POA: Diagnosis not present

## 2021-06-01 DIAGNOSIS — I358 Other nonrheumatic aortic valve disorders: Secondary | ICD-10-CM

## 2021-06-01 NOTE — Progress Notes (Signed)
Progress Note  Patient Name: Cathy Moody Date of Encounter: 06/01/2021  [facility] Cardiologist: Dietrich Pates, MD   Subjective   Denies chest pain, no significant shortness of breath.  Friend in the room.   Inpatient Medications    Scheduled Meds:  aspirin EC  81 mg Oral Daily   enoxaparin (LOVENOX) injection  40 mg Subcutaneous Q24H   levothyroxine  112 mcg Oral Q0600   LORazepam  0-4 mg Intravenous Q12H   Or   LORazepam  0-4 mg Oral Q12H   mometasone-formoterol  2 puff Inhalation BID   thiamine  100 mg Oral Daily   Or   thiamine  100 mg Intravenous Daily   Continuous Infusions:  penicillin g continuous IV infusion 12 Million Units (06/01/21 0749)   PRN Meds: acetaminophen **OR** acetaminophen, albuterol, albuterol, calamine, doxylamine (Sleep), HYDROcodone-acetaminophen, hydrOXYzine, ipratropium-albuterol   Vital Signs    Vitals:   05/31/21 1521 05/31/21 1753 05/31/21 2059 06/01/21 0550  BP: 122/77 138/73 (!) 118/96 139/66  Pulse: 81 91 76 79  Resp: (!) 24 19 20 20   Temp:  98 F (36.7 C) 98.1 F (36.7 C) 97.6 F (36.4 C)  TempSrc:      SpO2: 97% 97% 97% 97%  Weight:      Height:        Intake/Output Summary (Last 24 hours) at 06/01/2021 0848 Last data filed at 05/31/2021 2300 Gross per 24 hour  Intake 381.4 ml  Output --  Net 381.4 ml   Last 3 Weights 05/31/2021 05/29/2021 05/29/2021  Weight (lbs) 217 lb 6 oz 217 lb 6 oz 198 lb  Weight (kg) 98.6 kg 98.6 kg 89.812 kg      Telemetry    Sinus rhythm- Personally Reviewed  ECG    No new- Personally Reviewed  Physical Exam   GEN: No acute distress.   Neck: No JVD Cardiac: RRR, 4/6 systolic murmur no rubs, or gallops.  Respiratory: Minimal crackles bases. GI: Soft, nontender, non-distended  MS: No edema; No deformity. Neuro:  Nonfocal  Psych: Normal affect   Labs    High Sensitivity Troponin:  No results for input(s): TROPONINIHS in the last 720 hours.   Chemistry Recent Labs  Lab  05/28/21 1941 05/29/21 0639 05/30/21 0457 05/31/21 0445  NA 136 142 144 138  K 3.4* 4.2 4.3 3.8  CL 100 106 113* 105  CO2 24 26 25 26   GLUCOSE 170* 268* 216* 163*  BUN 17 14 14 16   CREATININE 1.29* 0.92 0.89 0.83  CALCIUM 8.7* 8.7* 9.1 8.1*  MG 1.6* 2.5* 2.1  --   PROT 7.3 6.8  --   --   ALBUMIN 3.8 3.1*  --   --   AST 26 25  --   --   ALT 19 19  --   --   ALKPHOS 95 73  --   --   BILITOT 1.4* 0.4  --   --   GFRNONAA 45* >60 >60 >60  ANIONGAP 12 10 6 7     Lipids No results for input(s): CHOL, TRIG, HDL, LABVLDL, LDLCALC, CHOLHDL in the last 168 hours.  Hematology Recent Labs  Lab 05/29/21 0639 05/30/21 0457 05/31/21 0445  WBC 34.0* 31.4* 24.6*  RBC 3.48* 3.52* 3.50*  HGB 11.8* 11.8* 11.7*  HCT 34.5* 35.2* 34.8*  MCV 99.1 100.0 99.4  MCH 33.9 33.5 33.4  MCHC 34.2 33.5 33.6  RDW 12.7 12.8 12.9  PLT 269 265 261   Thyroid  Recent  Labs  Lab 05/29/21 0639  TSH 10.760*    BNPNo results for input(s): BNP, PROBNP in the last 168 hours.  DDimer No results for input(s): DDIMER in the last 168 hours.   Radiology    ECHO TEE  Result Date: 05/31/2021    TRANSESOPHOGEAL ECHO REPORT   Patient Name:   Cathy Moody Date of Exam: 05/31/2021 Medical Rec #:  [id number]     Height:       60.0 in Accession #:    [id number]    Weight:       217.4 lb Date of Birth:  14-Feb-1951      BSA:          1.934 m Patient Age:    70 years      BP:           122/77 mmHg Patient Gender: F             HR:           81 bpm. Exam Location:  Inpatient Procedure: Transesophageal Echo, Cardiac Doppler and Color Doppler Indications:     Endocarditis  History:         Patient has prior history of Echocardiogram examinations, most                  recent 05/29/2021.                  Aortic Valve: Edwards bioprosthetic valve is present in the                  aortic position.  Sonographer:     Margreta Journey RDCS Referring Phys:  [id number] [facility] NICOLE DUKE Diagnosing Phys: Lavona Mound Tobb DO PROCEDURE: After  discussion of the risks and benefits of a TEE, an informed consent was obtained from the patient. The transesophogeal probe was passed without difficulty through the esophogus of the patient. Imaged were obtained with the patient in a left lateral decubitus position. Local oropharyngeal anesthetic was provided with viscous lidocaine. Sedation performed by different physician. The patient was monitored while under deep sedation. Anesthestetic sedation was provided intravenously by Anesthesiology: 116mg  of Propofol. Image quality was adequate. The patient's vital signs; including heart rate, blood pressure, and oxygen saturation; remained stable throughout the procedure. The patient developed no complications during the procedure. IMPRESSIONS  1. Left ventricular ejection fraction, by estimation, is 60 to 65%. The left ventricle has normal function. The left ventricle has no regional wall motion abnormalities.  2. Right ventricular systolic function is normal. The right ventricular size is normal.  3. The mitral valve is normal in structure. Mild to moderate mitral valve regurgitation. No evidence of mitral stenosis.  4. There are two seperate highly mobile/oscillating masses appreciated on the aortic bioprosthesis highly suspected to be a vegetation. Aortic valve regurgitation is mild.  5. Severe aortic valve stenosis. 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 Aortic valve area, by VTI measures 0.43 cm. Aortic valve mean gradient measures 64.0 mmHg. Aortic valve Vmax measures 4.95 m/s.  6. The inferior vena cava is normal in size with greater than 50% respiratory variability, suggesting right atrial pressure of 3 mmHg.  7. No left atrial/left atrial appendage thrombus was detected. Conclusion(s)/Recommendation(s): Findings are concerning for vegetation/infective endocarditis as detailed above. May need Cardiac CTA for better visualization of the aortic annulus. FINDINGS  Left Ventricle:  Left ventricular ejection fraction, by estimation, is 60 to 65%. The left ventricle has  normal function. The left ventricle has no regional wall motion abnormalities. The left ventricular internal cavity size was normal in size. There is  no left ventricular hypertrophy. Right Ventricle: The right ventricular size is normal. No increase in right ventricular wall thickness. Right ventricular systolic function is normal. Left Atrium: Left atrial size was normal in size. No left atrial/left atrial appendage thrombus was detected. Right Atrium: Right atrial size was normal in size. Pericardium: There is no evidence of pericardial effusion. Mitral Valve: The mitral valve is normal in structure. Mild to moderate mitral valve regurgitation. No evidence of mitral valve stenosis. Tricuspid Valve: The tricuspid valve is normal in structure. Tricuspid valve regurgitation is not demonstrated. No evidence of tricuspid stenosis. Aortic Valve: There are two seperate highly mobile/oscillating masses appreciated on the aortic bioprosthesis highly suspected to be a vegetation. These can be seen seperately in the LVOT and the other on the aortic side of the valve. On the short axis veiw there is inward flow towards the aortic annulus that is not well defined. The aortic valve has been repaired/replaced. Aortic valve regurgitation is mild. Severe aortic stenosis is present. Aortic valve mean gradient measures 64.0 mmHg. Aortic valve  peak gradient measures 98.0 mmHg. Aortic valve area, by VTI measures 0.43 cm. 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 Pulmonic Valve: The pulmonic valve was normal in structure. Pulmonic valve regurgitation is not visualized. No evidence of pulmonic stenosis. Aorta: The aortic root is normal in size and structure. Venous: The inferior vena cava is normal in size with greater than 50% respiratory variability, suggesting right atrial pressure of 3 mmHg. IAS/Shunts: No atrial  level shunt detected by color flow Doppler.  LEFT VENTRICLE PLAX 2D LVOT diam:     2.00 cm LV SV:         50 LV SV Index:   26 LVOT Area:     3.14 cm  AORTIC VALVE AV Area (Vmax):    0.44 cm AV Area (Vmean):   0.39 cm AV Area (VTI):     0.43 cm AV Vmax:           495.00 cm/s AV Vmean:          386.000 cm/s AV VTI:            1.170 m AV Peak Grad:      98.0 mmHg AV Mean Grad:      64.0 mmHg LVOT Vmax:         70.00 cm/s LVOT Vmean:        48.000 cm/s LVOT VTI:          0.160 m LVOT/AV VTI ratio: 0.14  SHUNTS Systemic VTI:  0.16 m Systemic Diam: 2.00 cm Kardie Tobb DO Electronically signed by Thomasene Ripple DO Signature Date/Time: 05/31/2021/6:27:35 PM    Final     Cardiac Studies   TEE as described above.  Aortic valve TAVR valve endocarditis  Patient Profile     70 y.o. female TAVR valve placed in 2014 here with bioprosthetic aortic valve endocarditis.  Assessment & Plan    Bioprosthetic/TAVR valve endocarditis - This has been discussed with the structural heart team who places TAVR valves, Dr. Excell Seltzer.  TEE reviewed.  Restricted motion of leaflets noted with mobile densities consistent with vegetations.  1 mobile density is quite bright in character, calcified questioning the chronicity of the infection-in other words may have been chronic for quite some time. -Continue with antibiotics as directed by  infectious disease.  Follow-up with gradients with repeat echocardiogram in 3 months.  If gradient is still high, consider anticoagulation for possible thrombus, but this was not recommended right now. -Follow-up with structural heart clinic with Dr. Excell Seltzer as well as Carlean Jews.  Severe COPD - Options limited.  This was the original reason for her TAVR valve placement   For questions or updates, please contact [facility] Please consult [email or web address] for contact info under        Signed, Donato Schultz, MD  06/01/2021, 8:48 AM

## 2021-06-01 NOTE — Progress Notes (Signed)
Patient ID: Cathy Moody, female   DOB: 12-Dec-1950, 70 y.o.   MRN: [id number]  PROGRESS NOTE    Cathy Moody  XIP:[id number] DOB: March 26, 1951 DOA: 05/28/2021 PCP: [name], MD   Brief Narrative:  70 year old female with history of COPD not on home oxygen, alcohol use, pulmonary hypertension, obesity and TAVR presented with fever.  On presentation, CT of the chest showed no PE or infiltrates.  CT abdomen and pelvis was unremarkable.  UA was unremarkable.  She was started on broad-spectrum antibiotics.  Blood cultures are growing gram-positive cocci; echo showed severe aortic stenosis and possible vegetation.  Cardiology was consulted for possible TEE.  Assessment & Plan:   Severe sepsis: Present on admission Streptococcus mitis/oralis bacteremia Aortic valve infective endocarditis in a patient with previous TAVR -Presented with fever, tachypnea, tachycardia and hypotension and acute metabolic encephalopathy possibly from bacteremia and endocarditis -Blood cultures growing Streptococcus mitis/oralis. -TTE showed possible aortic valve vegetation.  Status post TEE by cardiology on 05/31/2021 which showed highly mobile mass on the bioprosthetic aortic valve highly suspicious for vegetation with evidence of severe stenosis of the bioprosthetic valve.  Cardiology is recommending IV antibiotics for endocarditis: Might need follow-up in structural clinic as an outpatient. -Repeat blood cultures from today. -ID following and is recommending 6 weeks of antibiotics: Antibiotics have been switched to penicillin G on 05/31/2021 by ID.  Leukocytosis -Labs pending for today.  Acute respiratory failure with hypoxia -Not on oxygen at home.  Required supplemental oxygen on presentation.  Currently on 2 L oxygen via nasal cannula.  Acute metabolic encephalopathy -Possibly from sepsis.  Improving.  Currently waxing and waning.  If mental status does not improve, might have to consider MRI of  brain.  Hypomagnesemia Hypokalemia -Resolved  Alcohol abuse -Currently on CIWA protocol along with thiamine and folate  Hypothyroidism -Continue levothyroxine  Pulmonary hypertension -Cardiology following  Morbid obesity -Outpatient follow-up  COPD -Stable.  Continue Dulera and as needed bronchodilators  Abnormal TSH -Repeat TSH after acute illness   DVT prophylaxis: Lovenox Code Status: Full Family Communication: Mother at bedside on 05/31/2021 Disposition Plan: Status is: Inpatient  Remains inpatient appropriate because:Inpatient level of care appropriate due to severity of illness  Dispo: The patient is from: Home              Anticipated d/c is to: Home              Patient currently is not medically stable to d/c.   Difficult to place patient No  Consultants: Cardiology/ID.  Procedures: 2D echo/TEE  Antimicrobials:  Anti-infectives (From admission, onward)    Start     Dose/Rate Route Frequency Ordered Stop   05/31/21 1800  penicillin G potassium 12 Million Units in dextrose 5 % 500 mL continuous infusion        12 Million Units 41.7 mL/hr over 12 Hours Intravenous Every 12 hours 05/31/21 1417     05/30/21 1400  vancomycin (VANCOCIN) IVPB 1000 mg/200 mL premix  Status:  Discontinued        1,000 mg 200 mL/hr over 60 Minutes Intravenous Every 36 hours 05/29/21 0250 05/29/21 0956   05/29/21 2200  vancomycin (VANCOCIN) IVPB 1000 mg/200 mL premix  Status:  Discontinued        1,000 mg 200 mL/hr over 60 Minutes Intravenous Every 24 hours 05/29/21 0956 05/29/21 1544   05/29/21 1800  cefTRIAXone (ROCEPHIN) 2 g in sodium chloride 0.9 % 100 mL IVPB  Status:  Discontinued  2 g 200 mL/hr over 30 Minutes Intravenous Every 24 hours 05/29/21 1544 05/31/21 1757   05/29/21 1400  ceFEPIme (MAXIPIME) 2 g in sodium chloride 0.9 % 100 mL IVPB  Status:  Discontinued        2 g 200 mL/hr over 30 Minutes Intravenous Every 12 hours 05/29/21 0250 05/29/21 1000   05/29/21  1100  ceFEPIme (MAXIPIME) 2 g in sodium chloride 0.9 % 100 mL IVPB  Status:  Discontinued        2 g 200 mL/hr over 30 Minutes Intravenous Every 12 hours 05/29/21 1000 05/29/21 1544   05/29/21 0115  metroNIDAZOLE (FLAGYL) IVPB 500 mg  Status:  Discontinued        500 mg 100 mL/hr over 60 Minutes Intravenous Every 12 hours 05/29/21 0102 05/30/21 1242   05/29/21 0115  vancomycin (VANCOCIN) IVPB 1000 mg/200 mL premix        1,000 mg 200 mL/hr over 60 Minutes Intravenous  Once 05/29/21 0110 05/29/21 0317   05/29/21 0115  ceFEPIme (MAXIPIME) 2 g in sodium chloride 0.9 % 100 mL IVPB        2 g 200 mL/hr over 30 Minutes Intravenous  Once 05/29/21 0110 05/29/21 0317   05/28/21 2015  cefTRIAXone (ROCEPHIN) 2 g in sodium chloride 0.9 % 100 mL IVPB  Status:  Discontinued        2 g 200 mL/hr over 30 Minutes Intravenous Every 24 hours 05/28/21 2000 05/29/21 0110   05/28/21 2015  azithromycin (ZITHROMAX) 500 mg in sodium chloride 0.9 % 250 mL IVPB  Status:  Discontinued        500 mg 250 mL/hr over 60 Minutes Intravenous Every 24 hours 05/28/21 2000 05/29/21 0110         Subjective: Patient seen and examined at bedside.  Poor historian.  No overnight fever, vomiting, worsening shortness of breath reported. Objective: Vitals:   05/31/21 1521 05/31/21 1753 05/31/21 2059 06/01/21 0550  BP: 122/77 138/73 (!) 118/96 139/66  Pulse: 81 91 76 79  Resp: (!) 24 19 20 20   Temp:  98 F (36.7 C) 98.1 F (36.7 C) 97.6 F (36.4 C)  TempSrc:      SpO2: 97% 97% 97% 97%  Weight:      Height:        Intake/Output Summary (Last 24 hours) at 06/01/2021 0759 Last data filed at 05/31/2021 2300 Gross per 24 hour  Intake 381.4 ml  Output --  Net 381.4 ml    Filed Weights   05/29/21 0123 05/29/21 0443 05/31/21 1200  Weight: 89.8 kg 98.6 kg 98.6 kg    Examination:  General exam: Currently on 2 L oxygen by nasal cannula.  No distress. Respiratory system: Decreased breath sounds at bases bilaterally  with some scattered crackles Cardiovascular system: Currently rate controlled, S1-S2 heard gastrointestinal system: Abdomen is slightly distended, morbidly obese, soft and nontender.  Bowel sounds are heard  extremities: Mild lower extremity edema present; no clubbing  Central nervous system: Awake and alert, still slow to respond.  No focal neurological deficits.  Moves extremities  skin: No obvious ecchymosis/ulcers  psychiatry: Flat affect.  Data Reviewed: I have personally reviewed following labs and imaging studies  CBC: Recent Labs  Lab 05/28/21 1941 05/29/21 0639 05/30/21 0457 05/31/21 0445  WBC 33.6* 34.0* 31.4* 24.6*  NEUTROABS 29.9* 32.0*  --   --   HGB 13.3 11.8* 11.8* 11.7*  HCT 39.8 34.5* 35.2* 34.8*  MCV 98.0 99.1 100.0 99.4  PLT  288 269 265 261    Basic Metabolic Panel: Recent Labs  Lab 05/28/21 1941 05/29/21 0639 05/30/21 0457 05/31/21 0445  NA 136 142 144 138  K 3.4* 4.2 4.3 3.8  CL 100 106 113* 105  CO2 24 26 25 26   GLUCOSE 170* 268* 216* 163*  BUN 17 14 14 16   CREATININE 1.29* 0.92 0.89 0.83  CALCIUM 8.7* 8.7* 9.1 8.1*  MG 1.6* 2.5* 2.1  --   PHOS  --  4.0  --   --     GFR: Estimated Creatinine Clearance: 67.4 mL/min (by C-G formula based on SCr of 0.83 mg/dL). Liver Function Tests: Recent Labs  Lab 05/28/21 1941 05/29/21 0639  AST 26 25  ALT 19 19  ALKPHOS 95 73  BILITOT 1.4* 0.4  PROT 7.3 6.8  ALBUMIN 3.8 3.1*    No results for input(s): LIPASE, AMYLASE in the last 168 hours. Recent Labs  Lab 05/28/21 2330  AMMONIA 32    Coagulation Profile: Recent Labs  Lab 05/28/21 1941  INR 1.1    Cardiac Enzymes: Recent Labs  Lab 05/28/21 1941  CKTOTAL 62    BNP (last 3 results) No results for input(s): PROBNP in the last 8760 hours. HbA1C: Recent Labs    05/30/21 0457  HGBA1C 6.3*    CBG: Recent Labs  Lab 05/29/21 1855 05/29/21 2101 05/30/21 0735 05/30/21 1208  GLUCAP 214* 299* 188* 199*    Lipid Profile: No  results for input(s): CHOL, HDL, LDLCALC, TRIG, CHOLHDL, LDLDIRECT in the last 72 hours. Thyroid Function Tests: No results for input(s): TSH, T4TOTAL, FREET4, T3FREE, THYROIDAB in the last 72 hours.  Anemia Panel: No results for input(s): VITAMINB12, FOLATE, FERRITIN, TIBC, IRON, RETICCTPCT in the last 72 hours. Sepsis Labs: Recent Labs  Lab 05/28/21 1941 05/29/21 0000  LATICACIDVEN 1.6 1.5     Recent Results (from the past 240 hour(s))  Resp Panel by RT-PCR (Flu A&B, Covid) Nasopharyngeal Swab     Status: None   Collection Time: 05/28/21  7:41 PM   Specimen: Nasopharyngeal Swab; Nasopharyngeal(NP) swabs in vial transport medium  Result Value Ref Range Status   SARS Coronavirus 2 by RT PCR NEGATIVE NEGATIVE Final    Comment: (NOTE) SARS-CoV-2 target nucleic acids are NOT DETECTED.  The SARS-CoV-2 RNA is generally detectable in upper respiratory specimens during the acute phase of infection. The lowest concentration of SARS-CoV-2 viral copies this assay can detect is 138 copies/mL. A negative result does not preclude SARS-Cov-2 infection and should not be used as the sole basis for treatment or other patient management decisions. A negative result may occur with  improper specimen collection/handling, submission of specimen other than nasopharyngeal swab, presence of viral mutation(s) within the areas targeted by this assay, and inadequate number of viral copies(<138 copies/mL). A negative result must be combined with clinical observations, patient history, and epidemiological information. The expected result is Negative.  Fact Sheet for Patients:  07/29/21  Fact Sheet for Healthcare Providers:  07/28/21  This test is no t yet approved or cleared by the [employer] FDA and  has been authorized for detection and/or diagnosis of SARS-CoV-2 by FDA under an Emergency Use Authorization (EUA). This EUA will  remain  in effect (meaning this test can be used) for the duration of the COVID-19 declaration under Section 564(b)(1) of the Act, 21 U.S.C.section 360bbb-3(b)(1), unless the authorization is terminated  or revoked sooner.       Influenza A by PCR NEGATIVE NEGATIVE Final  Influenza B by PCR NEGATIVE NEGATIVE Final    Comment: (NOTE) The Xpert Xpress SARS-CoV-2/FLU/RSV plus assay is intended as an aid in the diagnosis of influenza from Nasopharyngeal swab specimens and should not be used as a sole basis for treatment. Nasal washings and aspirates are unacceptable for Xpert Xpress SARS-CoV-2/FLU/RSV testing.  Fact Sheet for Patients: [employer]  Fact Sheet for Healthcare Providers: [employer]it  This test is not yet approved or cleared by the [place] FDA and has been authorized for detection and/or diagnosis of SARS-CoV-2 by FDA under an Emergency Use Authorization (EUA). This EUA will remain in effect (meaning this test can be used) for the duration of the COVID-19 declaration under Section 564(b)(1) of the Act, 21 U.S.C. section 360bbb-3(b)(1), unless the authorization is terminated or revoked.  Performed at [facility], [address], [place], [place] [address]   Blood Culture (routine x 2)     Status: Abnormal   Collection Time: 05/28/21  7:41 PM   Specimen: BLOOD  Result Value Ref Range Status   Specimen Description   Final    BLOOD BLOOD RIGHT FOREARM Performed at [facility], [address], [place], [place] [address]    Special Requests   Final    BOTTLES DRAWN AEROBIC AND ANAEROBIC Blood Culture results may not be optimal due to an excessive volume of blood received in culture bottles Performed at [facility], [address], [place], [place] [address]    Culture  Setup Time   Final    GRAM POSITIVE COCCI IN CHAINS IN BOTH AEROBIC AND  ANAEROBIC BOTTLES CRITICAL VALUE NOTED.  VALUE IS CONSISTENT WITH PREVIOUSLY REPORTED AND CALLED VALUE.    Culture (A)  Final    STREPTOCOCCUS MITIS/ORALIS SUSCEPTIBILITIES PERFORMED ON PREVIOUS CULTURE WITHIN THE LAST 5 DAYS. Performed at [facility], [address] [place], [place] [address]    Report Status 05/31/2021 FINAL  Final  Blood Culture (routine x 2)     Status: Abnormal   Collection Time: 05/28/21  7:46 PM   Specimen: BLOOD LEFT FOREARM  Result Value Ref Range Status   Specimen Description   Final    BLOOD LEFT FOREARM Performed at [facility], [address] [place], [place] [address]    Special Requests   Final    BOTTLES DRAWN AEROBIC AND ANAEROBIC Blood Culture results may not be optimal due to an excessive volume of blood received in culture bottles Performed at [facility], [address], [place], [place] [address]    Culture  Setup Time   Final    GRAM POSITIVE COCCI IN CHAINS IN BOTH AEROBIC AND ANAEROBIC BOTTLES CRITICAL RESULT CALLED TO, READ BACK BY AND VERIFIED WITH: PHARMD CHRISTINE S. [id number] FCP Performed at [facility], [address], [place], [place] [address]    Culture STREPTOCOCCUS MITIS/ORALIS (A)  Final   Report Status 05/31/2021 FINAL  Final   Organism ID, Bacteria STREPTOCOCCUS MITIS/ORALIS  Final      Susceptibility   Streptococcus mitis/oralis - MIC*    TETRACYCLINE 0.5 SENSITIVE Sensitive     VANCOMYCIN 0.5 SENSITIVE Sensitive     CLINDAMYCIN <=0.25 SENSITIVE Sensitive     PENICILLIN Value in next row Sensitive      SENSITIVEMIC =0.06S    * STREPTOCOCCUS MITIS/ORALIS  Blood Culture ID Panel (Reflexed)     Status: Abnormal   Collection Time: 05/28/21  7:46 PM  Result  Value Ref Range Status   Enterococcus faecalis NOT DETECTED NOT DETECTED Final   Enterococcus Faecium NOT DETECTED NOT DETECTED Final   Listeria monocytogenes NOT DETECTED NOT DETECTED Final   Staphylococcus species NOT  DETECTED NOT DETECTED Final   Staphylococcus aureus (BCID) NOT DETECTED NOT DETECTED Final   Staphylococcus epidermidis NOT DETECTED NOT DETECTED Final   Staphylococcus lugdunensis NOT DETECTED NOT DETECTED Final   Streptococcus species DETECTED (A) NOT DETECTED Final    Comment: Not Enterococcus species, Streptococcus agalactiae, Streptococcus pyogenes, or Streptococcus pneumoniae. CRITICAL RESULT CALLED TO, READ BACK BY AND VERIFIED WITH: PHARMD CHRISTINE S. [id number] FCP    Streptococcus agalactiae NOT DETECTED NOT DETECTED Final   Streptococcus pneumoniae NOT DETECTED NOT DETECTED Final   Streptococcus pyogenes NOT DETECTED NOT DETECTED Final   A.calcoaceticus-baumannii NOT DETECTED NOT DETECTED Final   Bacteroides fragilis NOT DETECTED NOT DETECTED Final   Enterobacterales NOT DETECTED NOT DETECTED Final   Enterobacter cloacae complex NOT DETECTED NOT DETECTED Final   Escherichia coli NOT DETECTED NOT DETECTED Final   Klebsiella aerogenes NOT DETECTED NOT DETECTED Final   Klebsiella oxytoca NOT DETECTED NOT DETECTED Final   Klebsiella pneumoniae NOT DETECTED NOT DETECTED Final   Proteus species NOT DETECTED NOT DETECTED Final   Salmonella species NOT DETECTED NOT DETECTED Final   Serratia marcescens NOT DETECTED NOT DETECTED Final   Haemophilus influenzae NOT DETECTED NOT DETECTED Final   Neisseria meningitidis NOT DETECTED NOT DETECTED Final   Pseudomonas aeruginosa NOT DETECTED NOT DETECTED Final   Stenotrophomonas maltophilia NOT DETECTED NOT DETECTED Final   Candida albicans NOT DETECTED NOT DETECTED Final   Candida auris NOT DETECTED NOT DETECTED Final   Candida glabrata NOT DETECTED NOT DETECTED Final   Candida krusei NOT DETECTED NOT DETECTED Final   Candida parapsilosis NOT DETECTED NOT DETECTED Final   Candida tropicalis NOT DETECTED NOT DETECTED Final   Cryptococcus neoformans/gattii NOT DETECTED NOT DETECTED Final    Comment: Performed at [facility],  [address], [place], [place] [address]  Urine Culture     Status: None   Collection Time: 05/28/21 10:37 PM   Specimen: In/Out Cath Urine  Result Value Ref Range Status   Specimen Description   Final    IN/OUT CATH URINE Performed at [facility], [address] [place], [place] [address]    Special Requests   Final    NONE Performed at [facility], [address] [place], [place] [address]    Culture   Final    NO GROWTH Performed at [facility], [address] [place], [place] [address]    Report Status 05/29/2021 FINAL  Final  Respiratory (~20 pathogens) panel by PCR     Status: None   Collection Time: 05/28/21 11:07 PM   Specimen: Nasopharyngeal Swab; Respiratory  Result Value Ref Range Status   Adenovirus NOT DETECTED NOT DETECTED Final   Coronavirus 229E NOT DETECTED NOT DETECTED Final    Comment: (NOTE) The Coronavirus on the Respiratory Panel, DOES NOT test for the novel  Coronavirus (2019 nCoV)    Coronavirus HKU1 NOT DETECTED NOT DETECTED Final   Coronavirus NL63 NOT DETECTED NOT DETECTED Final   Coronavirus OC43 NOT DETECTED NOT DETECTED Final   Metapneumovirus NOT DETECTED NOT DETECTED Final   Rhinovirus / Enterovirus NOT DETECTED NOT DETECTED Final   Influenza A NOT DETECTED NOT DETECTED Final   Influenza B NOT DETECTED NOT DETECTED Final  Parainfluenza Virus 1 NOT DETECTED NOT DETECTED Final   Parainfluenza Virus 2 NOT DETECTED NOT DETECTED Final   Parainfluenza Virus 3 NOT DETECTED NOT DETECTED Final   Parainfluenza Virus 4 NOT DETECTED NOT DETECTED Final   Respiratory Syncytial Virus NOT DETECTED NOT DETECTED Final   Bordetella pertussis NOT DETECTED NOT DETECTED Final   Bordetella Parapertussis NOT DETECTED NOT DETECTED Final   Chlamydophila pneumoniae NOT DETECTED NOT DETECTED Final   Mycoplasma pneumoniae NOT DETECTED NOT DETECTED Final    Comment: Performed at [facility], [address]  [place], [place] [address]  MRSA Next Gen by PCR, Nasal     Status: None   Collection Time: 05/29/21  6:38 AM   Specimen: Nasal Mucosa; Nasal Swab  Result Value Ref Range Status   MRSA by PCR Next Gen NOT DETECTED NOT DETECTED Final    Comment: (NOTE) The GeneXpert MRSA Assay (FDA approved for NASAL specimens only), is one component of a comprehensive MRSA colonization surveillance program. It is not intended to diagnose MRSA infection nor to guide or monitor treatment for MRSA infections. Test performance is not FDA approved in patients less than 11 years old. Performed at [facility], [address] [place], [place] [address]           Radiology Studies: ECHO TEE  Result Date: 05/31/2021    TRANSESOPHOGEAL ECHO REPORT   Patient Name:   HOANG REICH Date of Exam: 05/31/2021 Medical Rec #:  [id number]     Height:       60.0 in Accession #:    [id number]    Weight:       217.4 lb Date of Birth:  1951/06/12      BSA:          1.934 m Patient Age:    70 years      BP:           122/77 mmHg Patient Gender: F             HR:           81 bpm. Exam Location:  Inpatient Procedure: Transesophageal Echo, Cardiac Doppler and Color Doppler Indications:     Endocarditis  History:         Patient has prior history of Echocardiogram examinations, most                  recent 05/29/2021.                  Aortic Valve: Edwards bioprosthetic valve is present in the                  aortic position.  Sonographer:     Margreta Journey RDCS Referring Phys:  [id number] [facility] NICOLE DUKE Diagnosing Phys: Lavona [place]ound Tobb DO PROCEDURE: After discussion of the risks and benefits of a TEE, an informed consent was obtained from the patient. The transesophogeal probe was passed without difficulty through the esophogus of the patient. Imaged were obtained with the patient in a left lateral decubitus position. Local oropharyngeal anesthetic was provided with viscous lidocaine. Sedation performed by different  physician. The patient was monitored while under deep sedation. Anesthestetic sedation was provided intravenously by Anesthesiology:  of Propofol. Image quality was adequate. The patient's vital signs; including heart rate, blood pressure, and oxygen saturation; remained stable throughout the procedure. The patient developed no complications during the procedure. IMPRESSIONS  1. Left ventricular ejection fraction, by estimation, is  60 to 65%. The left ventricle has normal function. The left ventricle has no regional wall motion abnormalities.  2. Right ventricular systolic function is normal. The right ventricular size is normal.  3. The mitral valve is normal in structure. Mild to moderate mitral valve regurgitation. No evidence of mitral stenosis.  4. There are two seperate highly mobile/oscillating masses appreciated on the aortic bioprosthesis highly suspected to be a vegetation. Aortic valve regurgitation is mild.  5. Severe aortic valve stenosis. 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 Aortic valve area, by VTI measures 0.43 cm. Aortic valve mean gradient measures 64.0 mmHg. Aortic valve Vmax measures 4.95 m/s.  6. The inferior vena cava is normal in size with greater than 50% respiratory variability, suggesting right atrial pressure of 3 mmHg.  7. No left atrial/left atrial appendage thrombus was detected. Conclusion(s)/Recommendation(s): Findings are concerning for vegetation/infective endocarditis as detailed above. May need Cardiac CTA for better visualization of the aortic annulus. FINDINGS  Left Ventricle: Left ventricular ejection fraction, by estimation, is 60 to 65%. The left ventricle has normal function. The left ventricle has no regional wall motion abnormalities. The left ventricular internal cavity size was normal in size. There is  no left ventricular hypertrophy. Right Ventricle: The right ventricular size is normal. No increase in right ventricular wall  thickness. Right ventricular systolic function is normal. Left Atrium: Left atrial size was normal in size. No left atrial/left atrial appendage thrombus was detected. Right Atrium: Right atrial size was normal in size. Pericardium: There is no evidence of pericardial effusion. Mitral Valve: The mitral valve is normal in structure. Mild to moderate mitral valve regurgitation. No evidence of mitral valve stenosis. Tricuspid Valve: The tricuspid valve is normal in structure. Tricuspid valve regurgitation is not demonstrated. No evidence of tricuspid stenosis. Aortic Valve: There are two seperate highly mobile/oscillating masses appreciated on the aortic bioprosthesis highly suspected to be a vegetation. These can be seen seperately in the LVOT and the other on the aortic side of the valve. On the short axis veiw there is inward flow towards the aortic annulus that is not well defined. The aortic valve has been repaired/replaced. Aortic valve regurgitation is mild. Severe aortic stenosis is present. Aortic valve mean gradient measures 64.0 mmHg. Aortic valve  peak gradient measures 98.0 mmHg. Aortic valve area, by VTI measures 0.43 cm. 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 Pulmonic Valve: The pulmonic valve was normal in structure. Pulmonic valve regurgitation is not visualized. No evidence of pulmonic stenosis. Aorta: The aortic root is normal in size and structure. Venous: The inferior vena cava is normal in size with greater than 50% respiratory variability, suggesting right atrial pressure of 3 mmHg. IAS/Shunts: No atrial level shunt detected by color flow Doppler.  LEFT VENTRICLE PLAX 2D LVOT diam:     2.00 cm LV SV:         50 LV SV Index:   26 LVOT Area:     3.14 cm  AORTIC VALVE AV Area (Vmax):    0.44 cm AV Area (Vmean):   0.39 cm AV Area (VTI):     0.43 cm AV Vmax:           495.00 cm/s AV Vmean:          386.000 cm/s AV VTI:            1.170 m AV Peak Grad:      98.0  mmHg AV Mean  Grad:      64.0 mmHg LVOT Vmax:         70.00 cm/s LVOT Vmean:        48.000 cm/s LVOT VTI:          0.160 m LVOT/AV VTI ratio: 0.14  SHUNTS Systemic VTI:  0.16 m Systemic Diam: 2.00 cm Kardie Tobb DO Electronically signed by Thomasene Ripple DO Signature Date/Time: 05/31/2021/6:27:35 PM    Final         Scheduled Meds:  aspirin EC  81 mg Oral Daily   enoxaparin (LOVENOX) injection  40 mg Subcutaneous Q24H   levothyroxine  112 mcg Oral Q0600   LORazepam  0-4 mg Intravenous Q12H   Or   LORazepam  0-4 mg Oral Q12H   mometasone-formoterol  2 puff Inhalation BID   thiamine  100 mg Oral Daily   Or   thiamine  100 mg Intravenous Daily   Continuous Infusions:  penicillin g continuous IV infusion 12 Million Units (06/01/21 0749)          Glade Lloyd, MD [facility] 06/01/2021, 7:59 AM

## 2021-06-02 ENCOUNTER — Encounter (HOSPITAL_COMMUNITY): Payer: Self-pay | Admitting: Cardiology

## 2021-06-02 DIAGNOSIS — Z952 Presence of prosthetic heart valve: Secondary | ICD-10-CM | POA: Diagnosis not present

## 2021-06-02 DIAGNOSIS — R6520 Severe sepsis without septic shock: Secondary | ICD-10-CM | POA: Diagnosis not present

## 2021-06-02 DIAGNOSIS — I358 Other nonrheumatic aortic valve disorders: Secondary | ICD-10-CM | POA: Diagnosis not present

## 2021-06-02 DIAGNOSIS — A419 Sepsis, unspecified organism: Secondary | ICD-10-CM | POA: Diagnosis not present

## 2021-06-02 DIAGNOSIS — G9341 Metabolic encephalopathy: Secondary | ICD-10-CM | POA: Diagnosis not present

## 2021-06-02 LAB — BASIC METABOLIC PANEL
Anion gap: 5 (ref 5–15)
BUN: 10 mg/dL (ref 8–23)
CO2: 30 mmol/L (ref 22–32)
Calcium: 8.2 mg/dL — ABNORMAL LOW (ref 8.9–10.3)
Chloride: 103 mmol/L (ref 98–111)
Creatinine, Ser: 0.72 mg/dL (ref 0.44–1.00)
GFR, Estimated: >60 mL/min (ref >60)
Glucose, Bld: 160 mg/dL — ABNORMAL HIGH (ref 70–99)
Potassium: 4.3 mmol/L (ref 3.5–5.1)
Sodium: 138 mmol/L (ref 135–145)

## 2021-06-02 LAB — CBC WITH DIFFERENTIAL/PLATELET
Abs Immature Granulocytes: 0.33 10*3/uL — ABNORMAL HIGH (ref 0.00–0.07)
Basophils Absolute: 0.1 10*3/uL (ref 0.0–0.1)
Basophils Relative: 0 %
Eosinophils Absolute: 0.5 10*3/uL (ref 0.0–0.5)
Eosinophils Relative: 3 %
HCT: 35.5 % — ABNORMAL LOW (ref 36.0–46.0)
Hemoglobin: 11.5 g/dL — ABNORMAL LOW (ref 12.0–15.0)
Immature Granulocytes: 2 %
Lymphocytes Relative: 8 %
Lymphs Abs: 1.2 10*3/uL (ref 0.7–4.0)
MCH: 32.8 pg (ref 26.0–34.0)
MCHC: 32.4 g/dL (ref 30.0–36.0)
MCV: 101.1 fL — ABNORMAL HIGH (ref 80.0–100.0)
Monocytes Absolute: 1.1 10*3/uL — ABNORMAL HIGH (ref 0.1–1.0)
Monocytes Relative: 7 %
Neutro Abs: 12.7 10*3/uL — ABNORMAL HIGH (ref 1.7–7.7)
Neutrophils Relative %: 80 %
Platelets: 241 10*3/uL (ref 150–400)
RBC: 3.51 MIL/uL — ABNORMAL LOW (ref 3.87–5.11)
RDW: 12.7 % (ref 11.5–15.5)
WBC: 15.9 10*3/uL — ABNORMAL HIGH (ref 4.0–10.5)
nRBC: 0 % (ref 0.0–0.2)

## 2021-06-02 LAB — MAGNESIUM: Magnesium: 1.9 mg/dL (ref 1.7–2.4)

## 2021-06-02 LAB — C-REACTIVE PROTEIN: CRP: 4.6 mg/dL — ABNORMAL HIGH (ref <1.0)

## 2021-06-02 MED ORDER — HYDROXYZINE HCL 25 MG PO TABS
25.0000 mg | ORAL_TABLET | Freq: Four times a day (QID) | ORAL | Status: DC | PRN
  Administered 2021-06-02 – 2021-06-05 (×11): 25 mg via ORAL
  Filled 2021-06-02 (×11): qty 1

## 2021-06-02 MED ORDER — CEFAZOLIN SODIUM-DEXTROSE 2-4 GM/100ML-% IV SOLN
2.0000 g | Freq: Three times a day (TID) | INTRAVENOUS | Status: DC
  Administered 2021-06-02 – 2021-06-05 (×8): 2 g via INTRAVENOUS
  Filled 2021-06-02 (×11): qty 100

## 2021-06-02 MED ORDER — SODIUM CHLORIDE 0.9 % IV SOLN
INTRAVENOUS | Status: DC | PRN
  Administered 2021-06-02: 250 mL via INTRAVENOUS

## 2021-06-02 NOTE — Progress Notes (Signed)
Pharmacy Antibiotic Note  Cathy Moody is a 70 y.o. female currently on penicillin G  for bioprosthetic/TAVR valve endocarditis.  Blood culture collected on 10/4 had strep mitis/oralis. Patient now has rash all over her body. Md suspects this may be related to Pen G. Pharmacy has been consulted on 10/9 to change abx to ancef.  Plan: - ancef 2gm IV q8h ___________________________________  Height: 5' (152.4 cm) Weight: 98.6 kg (217 lb 6 oz) IBW/kg (Calculated) : 45.5  Temp (24hrs), Avg:98.4 F (36.9 C), Min:98.2 F (36.8 C), Max:98.8 F (37.1 C)  Recent Labs  Lab 05/28/21 1941 05/29/21 0000 05/29/21 0639 05/30/21 0457 05/31/21 0445 06/02/21 0459  WBC 33.6*  --  34.0* 31.4* 24.6* 15.9*  CREATININE 1.29*  --  0.92 0.89 0.83 0.72  LATICACIDVEN 1.6 1.5  --   --   --   --     Estimated Creatinine Clearance: 69.9 mL/min (by C-G formula based on SCr of 0.72 mg/dL).    Allergies  Allergen Reactions   Plavix [Clopidogrel Bisulfate] Rash     Thank you for allowing pharmacy to be a part of this patient's care.  Lucia Gaskins 06/02/2021 12:18 PM

## 2021-06-02 NOTE — Progress Notes (Signed)
Patient ID: Cathy Moody, female   DOB: 1951-08-14, 70 y.o.   MRN: [id number]  PROGRESS NOTE    ASHAKI FROSCH  ZDG:[id number] DOB: Jun 21, 1951 DOA: 05/28/2021 PCP: [name], MD   Brief Narrative:  70 year old female with history of COPD not on home oxygen, alcohol use, pulmonary hypertension, obesity and TAVR 69 presented with fever.  On presentation, CT of the chest showed no PE or infiltrates.  CT abdomen and pelvis was unremarkable.  UA was unremarkable.  She was started on broad-spectrum antibiotics.  Blood cultures are growing gram-positive cocci; echo showed severe aortic stenosis and possible vegetation.  TEE confirmed aortic valve vegetation.  Assessment & Plan:   Severe sepsis: Present on admission Streptococcus mitis/oralis bacteremia Aortic valve infective endocarditis in a patient with previous TAVR -Presented with fever, tachypnea, tachycardia and hypotension and acute metabolic encephalopathy possibly from bacteremia and endocarditis -Blood cultures growing Streptococcus mitis/oralis. -TTE showed possible aortic valve vegetation.  Status post TEE by cardiology on 05/31/2021 which showed highly mobile mass on the bioprosthetic aortic valve highly suspicious for vegetation with evidence of severe stenosis of the bioprosthetic valve.  Cardiology is recommending IV antibiotics for endocarditis: Might need follow-up in structural clinic as an outpatient. -Repeat blood cultures from today. -ID following and is recommending 6 weeks of antibiotics: Antibiotics have been switched to penicillin G on 05/31/2021 by ID. -Currently afebrile.  Leukocytosis -Improving.  Acute respiratory failure with hypoxia -Not on oxygen at home.  Required supplemental oxygen on presentation.  Currently on 3 L oxygen via nasal cannula.  Acute metabolic encephalopathy -Possibly from sepsis.   -Mental status is improving.  Rash -Patient started to have rash when she was already on cefepime.  Has some  progression of her rash around her bilateral antecubital regions.  Continue monitoring.  Follow ID recommendations regarding need to change antibiotics.    Hypomagnesemia Hypokalemia -Resolved  Alcohol abuse -Currently on CIWA protocol along with thiamine and folate  Hypothyroidism -Continue levothyroxine  Pulmonary hypertension -Cardiology following  Morbid obesity -Outpatient follow-up  COPD -Stable.  Continue Dulera and as needed bronchodilators  Abnormal TSH -Repeat TSH after acute illness   DVT prophylaxis: Lovenox Code Status: Full Family Communication: Mother at bedside on 06/01/2021 Disposition Plan: Status is: Inpatient  Remains inpatient appropriate because:Inpatient level of care appropriate due to severity of illness  Dispo: The patient is from: Home              Anticipated d/c is to: Home              Patient currently is not medically stable to d/c.   Difficult to place patient No  Consultants: Cardiology/ID.  Procedures: 2D echo/TEE  Antimicrobials:  Anti-infectives (From admission, onward)    Start     Dose/Rate Route Frequency Ordered Stop   05/31/21 1800  penicillin G potassium 12 Million Units in dextrose 5 % 500 mL continuous infusion        12 Million Units 41.7 mL/hr over 12 Hours Intravenous Every 12 hours 05/31/21 1417     05/30/21 1400  vancomycin (VANCOCIN) IVPB 1000 mg/200 mL premix  Status:  Discontinued        1,000 mg 200 mL/hr over 60 Minutes Intravenous Every 36 hours 05/29/21 0250 05/29/21 0956   05/29/21 2200  vancomycin (VANCOCIN) IVPB 1000 mg/200 mL premix  Status:  Discontinued        1,000 mg 200 mL/hr over 60 Minutes Intravenous Every 24 hours 05/29/21 0956 05/29/21 1544  05/29/21 1800  cefTRIAXone (ROCEPHIN) 2 g in sodium chloride 0.9 % 100 mL IVPB  Status:  Discontinued        2 g 200 mL/hr over 30 Minutes Intravenous Every 24 hours 05/29/21 1544 05/31/21 1757   05/29/21 1400  ceFEPIme (MAXIPIME) 2 g in sodium  chloride 0.9 % 100 mL IVPB  Status:  Discontinued        2 g 200 mL/hr over 30 Minutes Intravenous Every 12 hours 05/29/21 0250 05/29/21 1000   05/29/21 1100  ceFEPIme (MAXIPIME) 2 g in sodium chloride 0.9 % 100 mL IVPB  Status:  Discontinued        2 g 200 mL/hr over 30 Minutes Intravenous Every 12 hours 05/29/21 1000 05/29/21 1544   05/29/21 0115  metroNIDAZOLE (FLAGYL) IVPB 500 mg  Status:  Discontinued        500 mg 100 mL/hr over 60 Minutes Intravenous Every 12 hours 05/29/21 0102 05/30/21 1242   05/29/21 0115  vancomycin (VANCOCIN) IVPB 1000 mg/200 mL premix        1,000 mg 200 mL/hr over 60 Minutes Intravenous  Once 05/29/21 0110 05/29/21 0317   05/29/21 0115  ceFEPIme (MAXIPIME) 2 g in sodium chloride 0.9 % 100 mL IVPB        2 g 200 mL/hr over 30 Minutes Intravenous  Once 05/29/21 0110 05/29/21 0317   05/28/21 2015  cefTRIAXone (ROCEPHIN) 2 g in sodium chloride 0.9 % 100 mL IVPB  Status:  Discontinued        2 g 200 mL/hr over 30 Minutes Intravenous Every 24 hours 05/28/21 2000 05/29/21 0110   05/28/21 2015  azithromycin (ZITHROMAX) 500 mg in sodium chloride 0.9 % 250 mL IVPB  Status:  Discontinued        500 mg 250 mL/hr over 60 Minutes Intravenous Every 24 hours 05/28/21 2000 05/29/21 0110         Subjective: Patient seen and examined at bedside.  Denies worsening chest pain, shortness of breath, vomiting or fever.  Complains of some itching and rash in her on her bilateral elbows and arm.  Objective: Vitals:   06/01/21 0550 06/01/21 1227 06/01/21 2036 06/02/21 0501  BP: 139/66 (!) 142/77 (!) 143/92 129/71  Pulse: 79 85 97 77  Resp: 20 18  20   Temp: 97.6 F (36.4 C) 98.8 F (37.1 C) 98.3 F (36.8 C) 98.2 F (36.8 C)  TempSrc:  Oral Oral   SpO2: 97% 93% 96% 96%  Weight:      Height:        Intake/Output Summary (Last 24 hours) at 06/02/2021 0806 Last data filed at 06/01/2021 1500 Gross per 24 hour  Intake 985.5 ml  Output --  Net 985.5 ml    Filed  Weights   05/29/21 0123 05/29/21 0443 05/31/21 1200  Weight: 89.8 kg 98.6 kg 98.6 kg    Examination:  General exam: No acute distress.  On 2 L oxygen via nasal cannula.  Looks chronically ill and deconditioned. Respiratory system: Bilateral decreased breath sounds at bases with some crackles  cardiovascular system: S1-S2 heard, rate controlled  gastrointestinal system: Abdomen is distended mildly, morbidly obese, soft and nontender.  Bowel sounds are heard  extremities: No cyanosis; trace lower extremity edema present Central nervous system: Alert and oriented, slow to respond.  No focal neurological deficits.  Moving extremities skin: Erythematous maculopapular rash on upper and lower back and around bilateral antecubital regions and lower arms psychiatry: Affect is flat  Data Reviewed:  I have personally reviewed following labs and imaging studies  CBC: Recent Labs  Lab 05/28/21 1941 05/29/21 0639 05/30/21 0457 05/31/21 0445 06/02/21 0459  WBC 33.6* 34.0* 31.4* 24.6* 15.9*  NEUTROABS 29.9* 32.0*  --   --  12.7*  HGB 13.3 11.8* 11.8* 11.7* 11.5*  HCT 39.8 34.5* 35.2* 34.8* 35.5*  MCV 98.0 99.1 100.0 99.4 101.1*  PLT 288 269 265 261 241    Basic Metabolic Panel: Recent Labs  Lab 05/28/21 1941 05/29/21 0639 05/30/21 0457 05/31/21 0445 06/02/21 0459  NA 136 142 144 138 138  K 3.4* 4.2 4.3 3.8 4.3  CL 100 106 113* 105 103  CO2 24 26 25 26 30   GLUCOSE 170* 268* 216* 163* 160*  BUN 17 14 14 16 10   CREATININE 1.29* 0.92 0.89 0.83 0.72  CALCIUM 8.7* 8.7* 9.1 8.1* 8.2*  MG 1.6* 2.5* 2.1  --  1.9  PHOS  --  4.0  --   --   --     GFR: Estimated Creatinine Clearance: 69.9 mL/min (by C-G formula based on SCr of 0.72 mg/dL). Liver Function Tests: Recent Labs  Lab 05/28/21 1941 05/29/21 0639  AST 26 25  ALT 19 19  ALKPHOS 95 73  BILITOT 1.4* 0.4  PROT 7.3 6.8  ALBUMIN 3.8 3.1*    No results for input(s): LIPASE, AMYLASE in the last 168 hours. Recent Labs  Lab  05/28/21 2330  AMMONIA 32    Coagulation Profile: Recent Labs  Lab 05/28/21 1941  INR 1.1    Cardiac Enzymes: Recent Labs  Lab 05/28/21 1941  CKTOTAL 62    BNP (last 3 results) No results for input(s): PROBNP in the last 8760 hours. HbA1C: No results for input(s): HGBA1C in the last 72 hours.  CBG: Recent Labs  Lab 05/29/21 1855 05/29/21 2101 05/30/21 0735 05/30/21 1208  GLUCAP 214* 299* 188* 199*    Lipid Profile: No results for input(s): CHOL, HDL, LDLCALC, TRIG, CHOLHDL, LDLDIRECT in the last 72 hours. Thyroid Function Tests: No results for input(s): TSH, T4TOTAL, FREET4, T3FREE, THYROIDAB in the last 72 hours.  Anemia Panel: No results for input(s): VITAMINB12, FOLATE, FERRITIN, TIBC, IRON, RETICCTPCT in the last 72 hours. Sepsis Labs: Recent Labs  Lab 05/28/21 1941 05/29/21 0000  LATICACIDVEN 1.6 1.5     Recent Results (from the past 240 hour(s))  Resp Panel by RT-PCR (Flu A&B, Covid) Nasopharyngeal Swab     Status: None   Collection Time: 05/28/21  7:41 PM   Specimen: Nasopharyngeal Swab; Nasopharyngeal(NP) swabs in vial transport medium  Result Value Ref Range Status   SARS Coronavirus 2 by RT PCR NEGATIVE NEGATIVE Final    Comment: (NOTE) SARS-CoV-2 target nucleic acids are NOT DETECTED.  The SARS-CoV-2 RNA is generally detectable in upper respiratory specimens during the acute phase of infection. The lowest concentration of SARS-CoV-2 viral copies this assay can detect is 138 copies/mL. A negative result does not preclude SARS-Cov-2 infection and should not be used as the sole basis for treatment or other patient management decisions. A negative result may occur with  improper specimen collection/handling, submission of specimen other than nasopharyngeal swab, presence of viral mutation(s) within the areas targeted by this assay, and inadequate number of viral copies(<138 copies/mL). A negative result must be combined with clinical  observations, patient history, and epidemiological information. The expected result is Negative.  Fact Sheet for Patients:  BloggerCourse.com  Fact Sheet for Healthcare Providers:  SeriousBroker.it  This test is no t yet  approved or cleared by the [place] and  has been authorized for detection and/or diagnosis of SARS-CoV-2 by FDA under an Emergency Use Authorization (EUA). This EUA will remain  in effect (meaning this test can be used) for the duration of the COVID-19 declaration under Section 564(b)(1) of the Act, 21 U.S.C.section 360bbb-3(b)(1), unless the authorization is terminated  or revoked sooner.       Influenza A by PCR NEGATIVE NEGATIVE Final   Influenza B by PCR NEGATIVE NEGATIVE Final    Comment: (NOTE) The Xpert Xpress SARS-CoV-2/FLU/RSV plus assay is intended as an aid in the diagnosis of influenza from Nasopharyngeal swab specimens and should not be used as a sole basis for treatment. Nasal washings and aspirates are unacceptable for Xpert Xpress SARS-CoV-2/FLU/RSV testing.  Fact Sheet for Patients: BloggerCourse.com  Fact Sheet for Healthcare Providers: SeriousBroker.it  This test is not yet approved or cleared by the [place] FDA and has been authorized for detection and/or diagnosis of SARS-CoV-2 by FDA under an Emergency Use Authorization (EUA). This EUA will remain in effect (meaning this test can be used) for the duration of the COVID-19 declaration under Section 564(b)(1) of the Act, 21 U.S.C. section 360bbb-3(b)(1), unless the authorization is terminated or revoked.  Performed at [facility], [address], [place], [place] [address]   Blood Culture (routine x 2)     Status: Abnormal   Collection Time: 05/28/21  7:41 PM   Specimen: BLOOD  Result Value Ref Range Status   Specimen Description   Final    BLOOD  BLOOD RIGHT FOREARM Performed at [facility], [address], [place], [place] [address]    Special Requests   Final    BOTTLES DRAWN AEROBIC AND ANAEROBIC Blood Culture results may not be optimal due to an excessive volume of blood received in culture bottles Performed at [facility], [address] [place], [place] [address]    Culture  Setup Time   Final    GRAM POSITIVE COCCI IN CHAINS IN BOTH AEROBIC AND ANAEROBIC BOTTLES CRITICAL VALUE NOTED.  VALUE IS CONSISTENT WITH PREVIOUSLY REPORTED AND CALLED VALUE.    Culture (A)  Final    STREPTOCOCCUS MITIS/ORALIS SUSCEPTIBILITIES PERFORMED ON PREVIOUS CULTURE WITHIN THE LAST 5 DAYS. Performed at [facility], [address] [place], [place] [address]    Report Status 05/31/2021 FINAL  Final  Blood Culture (routine x 2)     Status: Abnormal   Collection Time: 05/28/21  7:46 PM   Specimen: BLOOD LEFT FOREARM  Result Value Ref Range Status   Specimen Description   Final    BLOOD LEFT FOREARM Performed at [facility], [address], [place], [place] [address]    Special Requests   Final    BOTTLES DRAWN AEROBIC AND ANAEROBIC Blood Culture results may not be optimal due to an excessive volume of blood received in culture bottles Performed at [facility], [address], [place], [place] [address]    Culture  Setup Time   Final    GRAM POSITIVE COCCI IN CHAINS IN BOTH AEROBIC AND ANAEROBIC BOTTLES CRITICAL RESULT CALLED TO, READ BACK BY AND VERIFIED WITH: PHARMD CHRISTINE S. [id number] FCP Performed at [facility], [address] [place], [place] [address]    Culture STREPTOCOCCUS MITIS/ORALIS (A)  Final   Report Status 05/31/2021 FINAL  Final   Organism ID, Bacteria STREPTOCOCCUS MITIS/ORALIS  Final  Susceptibility   Streptococcus mitis/oralis - MIC*    TETRACYCLINE 0.5 SENSITIVE Sensitive     VANCOMYCIN 0.5 SENSITIVE Sensitive      CLINDAMYCIN <=0.25 SENSITIVE Sensitive     PENICILLIN Value in next row Sensitive      SENSITIVEMIC =0.06S    * STREPTOCOCCUS MITIS/ORALIS  Blood Culture ID Panel (Reflexed)     Status: Abnormal   Collection Time: 05/28/21  7:46 PM  Result Value Ref Range Status   Enterococcus faecalis NOT DETECTED NOT DETECTED Final   Enterococcus Faecium NOT DETECTED NOT DETECTED Final   Listeria monocytogenes NOT DETECTED NOT DETECTED Final   Staphylococcus species NOT DETECTED NOT DETECTED Final   Staphylococcus aureus (BCID) NOT DETECTED NOT DETECTED Final   Staphylococcus epidermidis NOT DETECTED NOT DETECTED Final   Staphylococcus lugdunensis NOT DETECTED NOT DETECTED Final   Streptococcus species DETECTED (A) NOT DETECTED Final    Comment: Not Enterococcus species, Streptococcus agalactiae, Streptococcus pyogenes, or Streptococcus pneumoniae. CRITICAL RESULT CALLED TO, READ BACK BY AND VERIFIED WITH: PHARMD CHRISTINE S. [id number] FCP    Streptococcus agalactiae NOT DETECTED NOT DETECTED Final   Streptococcus pneumoniae NOT DETECTED NOT DETECTED Final   Streptococcus pyogenes NOT DETECTED NOT DETECTED Final   A.calcoaceticus-baumannii NOT DETECTED NOT DETECTED Final   Bacteroides fragilis NOT DETECTED NOT DETECTED Final   Enterobacterales NOT DETECTED NOT DETECTED Final   Enterobacter cloacae complex NOT DETECTED NOT DETECTED Final   Escherichia coli NOT DETECTED NOT DETECTED Final   Klebsiella aerogenes NOT DETECTED NOT DETECTED Final   Klebsiella oxytoca NOT DETECTED NOT DETECTED Final   Klebsiella pneumoniae NOT DETECTED NOT DETECTED Final   Proteus species NOT DETECTED NOT DETECTED Final   Salmonella species NOT DETECTED NOT DETECTED Final   Serratia marcescens NOT DETECTED NOT DETECTED Final   Haemophilus influenzae NOT DETECTED NOT DETECTED Final   Neisseria meningitidis NOT DETECTED NOT DETECTED Final   Pseudomonas aeruginosa NOT DETECTED NOT DETECTED Final   Stenotrophomonas  maltophilia NOT DETECTED NOT DETECTED Final   Candida albicans NOT DETECTED NOT DETECTED Final   Candida auris NOT DETECTED NOT DETECTED Final   Candida glabrata NOT DETECTED NOT DETECTED Final   Candida krusei NOT DETECTED NOT DETECTED Final   Candida parapsilosis NOT DETECTED NOT DETECTED Final   Candida tropicalis NOT DETECTED NOT DETECTED Final   Cryptococcus neoformans/gattii NOT DETECTED NOT DETECTED Final    Comment: Performed at [facility], [address], [place], [place] [address]  Urine Culture     Status: None   Collection Time: 05/28/21 10:37 PM   Specimen: In/Out Cath Urine  Result Value Ref Range Status   Specimen Description   Final    IN/OUT CATH URINE Performed at [facility], [address] [place], [place] [address]    Special Requests   Final    NONE Performed at [facility], [address], [place], [place] [address]    Culture   Final    NO GROWTH Performed at [facility], [address], [place], [place] [address]    Report Status 05/29/2021 FINAL  Final  Respiratory (~20 pathogens) panel by PCR     Status: None   Collection Time: 05/28/21 11:07 PM   Specimen: Nasopharyngeal Swab; Respiratory  Result Value Ref Range Status   Adenovirus NOT DETECTED NOT DETECTED Final   Coronavirus 229E NOT DETECTED NOT DETECTED Final    Comment: (NOTE) The Coronavirus on the Respiratory Panel, DOES  NOT test for the novel  Coronavirus (2019 nCoV)    Coronavirus HKU1 NOT DETECTED NOT DETECTED Final   Coronavirus NL63 NOT DETECTED NOT DETECTED Final   Coronavirus OC43 NOT DETECTED NOT DETECTED Final   Metapneumovirus NOT DETECTED NOT DETECTED Final   Rhinovirus / Enterovirus NOT DETECTED NOT DETECTED Final   Influenza A NOT DETECTED NOT DETECTED Final   Influenza B NOT DETECTED NOT DETECTED Final   Parainfluenza Virus 1 NOT DETECTED NOT DETECTED Final   Parainfluenza Virus 2 NOT DETECTED NOT DETECTED Final    Parainfluenza Virus 3 NOT DETECTED NOT DETECTED Final   Parainfluenza Virus 4 NOT DETECTED NOT DETECTED Final   Respiratory Syncytial Virus NOT DETECTED NOT DETECTED Final   Bordetella pertussis NOT DETECTED NOT DETECTED Final   Bordetella Parapertussis NOT DETECTED NOT DETECTED Final   Chlamydophila pneumoniae NOT DETECTED NOT DETECTED Final   Mycoplasma pneumoniae NOT DETECTED NOT DETECTED Final    Comment: Performed at [facility], [address] [place], [place] [address]  MRSA Next Gen by PCR, Nasal     Status: None   Collection Time: 05/29/21  6:38 AM   Specimen: Nasal Mucosa; Nasal Swab  Result Value Ref Range Status   MRSA by PCR Next Gen NOT DETECTED NOT DETECTED Final    Comment: (NOTE) The GeneXpert MRSA Assay (FDA approved for NASAL specimens only), is one component of a comprehensive MRSA colonization surveillance program. It is not intended to diagnose MRSA infection nor to guide or monitor treatment for MRSA infections. Test performance is not FDA approved in patients less than 51 years old. Performed at [facility], [address] [place], [place] [address]           Radiology Studies: ECHO TEE  Result Date: 05/31/2021    TRANSESOPHOGEAL ECHO REPORT   Patient Name:   Cathy Moody Date of Exam: 05/31/2021 Medical Rec #:  [id number]     Height:       60.0 in Accession #:    [id number]    Weight:       217.4 lb Date of Birth:  1950-08-28      BSA:          1.934 m Patient Age:    70 years      BP:           122/77 mmHg Patient Gender: F             HR:           81 bpm. Exam Location:  Inpatient Procedure: Transesophageal Echo, Cardiac Doppler and Color Doppler Indications:     Endocarditis  History:         Patient has prior history of Echocardiogram examinations, most                  recent 05/29/2021.                  Aortic Valve: Edwards bioprosthetic valve is present in the                  aortic position.  Sonographer:     Margreta Journey RDCS Referring Phys:  [id number] [facility] NICOLE DUKE Diagnosing Phys: Lavona [place]ound Tobb DO PROCEDURE: After discussion of the risks and benefits of a TEE, an informed consent was obtained from the patient. The transesophogeal probe was passed without difficulty through the esophogus of the patient. Imaged were obtained with the patient in  a left lateral decubitus position. Local oropharyngeal anesthetic was provided with viscous lidocaine. Sedation performed by different physician. The patient was monitored while under deep sedation. Anesthestetic sedation was provided intravenously by Anesthesiology: 116mg  of Propofol. Image quality was adequate. The patient's vital signs; including heart rate, blood pressure, and oxygen saturation; remained stable throughout the procedure. The patient developed no complications during the procedure. IMPRESSIONS  1. Left ventricular ejection fraction, by estimation, is 60 to 65%. The left ventricle has normal function. The left ventricle has no regional wall motion abnormalities.  2. Right ventricular systolic function is normal. The right ventricular size is normal.  3. The mitral valve is normal in structure. Mild to moderate mitral valve regurgitation. No evidence of mitral stenosis.  4. There are two seperate highly mobile/oscillating masses appreciated on the aortic bioprosthesis highly suspected to be a vegetation. Aortic valve regurgitation is mild.  5. Severe aortic valve stenosis. 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 Aortic valve area, by VTI measures 0.43 cm. Aortic valve mean gradient measures 64.0 mmHg. Aortic valve Vmax measures 4.95 m/s.  6. The inferior vena cava is normal in size with greater than 50% respiratory variability, suggesting right atrial pressure of 3 mmHg.  7. No left atrial/left atrial appendage thrombus was detected. Conclusion(s)/Recommendation(s): Findings are concerning for vegetation/infective endocarditis as  detailed above. May need Cardiac CTA for better visualization of the aortic annulus. FINDINGS  Left Ventricle: Left ventricular ejection fraction, by estimation, is 60 to 65%. The left ventricle has normal function. The left ventricle has no regional wall motion abnormalities. The left ventricular internal cavity size was normal in size. There is  no left ventricular hypertrophy. Right Ventricle: The right ventricular size is normal. No increase in right ventricular wall thickness. Right ventricular systolic function is normal. Left Atrium: Left atrial size was normal in size. No left atrial/left atrial appendage thrombus was detected. Right Atrium: Right atrial size was normal in size. Pericardium: There is no evidence of pericardial effusion. Mitral Valve: The mitral valve is normal in structure. Mild to moderate mitral valve regurgitation. No evidence of mitral valve stenosis. Tricuspid Valve: The tricuspid valve is normal in structure. Tricuspid valve regurgitation is not demonstrated. No evidence of tricuspid stenosis. Aortic Valve: There are two seperate highly mobile/oscillating masses appreciated on the aortic bioprosthesis highly suspected to be a vegetation. These can be seen seperately in the LVOT and the other on the aortic side of the valve. On the short axis veiw there is inward flow towards the aortic annulus that is not well defined. The aortic valve has been repaired/replaced. Aortic valve regurgitation is mild. Severe aortic stenosis is present. Aortic valve mean gradient measures 64.0 mmHg. Aortic valve  peak gradient measures 98.0 mmHg. Aortic valve area, by VTI measures 0.43 cm. 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 Pulmonic Valve: The pulmonic valve was normal in structure. Pulmonic valve regurgitation is not visualized. No evidence of pulmonic stenosis. Aorta: The aortic root is normal in size and structure. Venous: The inferior vena cava is normal in size  with greater than 50% respiratory variability, suggesting right atrial pressure of 3 mmHg. IAS/Shunts: No atrial level shunt detected by color flow Doppler.  LEFT VENTRICLE PLAX 2D LVOT diam:     2.00 cm LV SV:         50 LV SV Index:   26 LVOT Area:     3.14 cm  AORTIC VALVE AV Area (Vmax):  0.44 cm AV Area (Vmean):   0.39 cm AV Area (VTI):     0.43 cm AV Vmax:           495.00 cm/s AV Vmean:          386.000 cm/s AV VTI:            1.170 m AV Peak Grad:      98.0 mmHg AV Mean Grad:      64.0 mmHg LVOT Vmax:         70.00 cm/s LVOT Vmean:        48.000 cm/s LVOT VTI:          0.160 m LVOT/AV VTI ratio: 0.14  SHUNTS Systemic VTI:  0.16 m Systemic Diam: 2.00 cm Kardie Tobb DO Electronically signed by Thomasene Ripple DO Signature Date/Time: 05/31/2021/6:27:35 PM    Final         Scheduled Meds:  aspirin EC  81 mg Oral Daily   enoxaparin (LOVENOX) injection  40 mg Subcutaneous Q24H   levothyroxine  112 mcg Oral Q0600   mometasone-formoterol  2 puff Inhalation BID   thiamine  100 mg Oral Daily   Or   thiamine  100 mg Intravenous Daily   Continuous Infusions:  penicillin g continuous IV infusion 12 Million Units (06/02/21 0045)          Glade Lloyd, MD [facility] 06/02/2021, 8:06 AM

## 2021-06-02 NOTE — Progress Notes (Signed)
Progress Note  Patient Name: Cathy Moody Date of Encounter: 06/02/2021  [facility] Cardiologist: Dietrich Pates, MD   Subjective   Sitting up in bed.  Family member at bedside.  Stated that rash seems to be getting a little bit worse.  Inpatient Medications    Scheduled Meds:  aspirin EC  81 mg Oral Daily   enoxaparin (LOVENOX) injection  40 mg Subcutaneous Q24H   levothyroxine  112 mcg Oral Q0600   mometasone-formoterol  2 puff Inhalation BID   thiamine  100 mg Oral Daily   Or   thiamine  100 mg Intravenous Daily   Continuous Infusions:  penicillin g continuous IV infusion 12 Million Units (06/02/21 0045)   PRN Meds: acetaminophen **OR** acetaminophen, albuterol, albuterol, calamine, doxylamine (Sleep), HYDROcodone-acetaminophen, hydrOXYzine, ipratropium-albuterol   Vital Signs    Vitals:   06/01/21 1227 06/01/21 2036 06/02/21 0501 06/02/21 0813  BP: (!) 142/77 (!) 143/92 129/71   Pulse: 85 97 77   Resp: 18  20   Temp: 98.8 F (37.1 C) 98.3 F (36.8 C) 98.2 F (36.8 C)   TempSrc: Oral Oral    SpO2: 93% 96% 96% 97%  Weight:      Height:        Intake/Output Summary (Last 24 hours) at 06/02/2021 0926 Last data filed at 06/01/2021 1500 Gross per 24 hour  Intake 745.5 ml  Output --  Net 745.5 ml   Last 3 Weights 05/31/2021 05/29/2021 05/29/2021  Weight (lbs) 217 lb 6 oz 217 lb 6 oz 198 lb  Weight (kg) 98.6 kg 98.6 kg 89.812 kg      Telemetry    SR - Personally Reviewed  ECG    No new - Personally Reviewed  Physical Exam   GEN: No acute distress.   Neck: No JVD Cardiac: RRR, 3/6 SM,no rubs, or gallops.  Respiratory: Clear to auscultation bilaterally. GI: Soft, nontender, non-distended  MS: No edema; No deformity. Skin rash noted Neuro:  Nonfocal  Psych: Normal affect   Labs    High Sensitivity Troponin:  No results for input(s): TROPONINIHS in the last 720 hours.   Chemistry Recent Labs  Lab 05/28/21 1941 05/29/21 0639 05/30/21 0457  05/31/21 0445 06/02/21 0459  NA 136 142 144 138 138  K 3.4* 4.2 4.3 3.8 4.3  CL 100 106 113* 105 103  CO2 24 26 25 26 30   GLUCOSE 170* 268* 216* 163* 160*  BUN 17 14 14 16 10   CREATININE 1.29* 0.92 0.89 0.83 0.72  CALCIUM 8.7* 8.7* 9.1 8.1* 8.2*  MG 1.6* 2.5* 2.1  --  1.9  PROT 7.3 6.8  --   --   --   ALBUMIN 3.8 3.1*  --   --   --   AST 26 25  --   --   --   ALT 19 19  --   --   --   ALKPHOS 95 73  --   --   --   BILITOT 1.4* 0.4  --   --   --   GFRNONAA 45* >60 >60 >60 >60  ANIONGAP 12 10 6 7 5     Lipids No results for input(s): CHOL, TRIG, HDL, LABVLDL, LDLCALC, CHOLHDL in the last 168 hours.  Hematology Recent Labs  Lab 05/30/21 0457 05/31/21 0445 06/02/21 0459  WBC 31.4* 24.6* 15.9*  RBC 3.52* 3.50* 3.51*  HGB 11.8* 11.7* 11.5*  HCT 35.2* 34.8* 35.5*  MCV 100.0 99.4 101.1*  MCH 33.5  33.4 32.8  MCHC 33.5 33.6 32.4  RDW 12.8 12.9 12.7  PLT 265 261 241   Thyroid  Recent Labs  Lab 05/29/21 0639  TSH 10.760*    BNPNo results for input(s): BNP, PROBNP in the last 168 hours.  DDimer No results for input(s): DDIMER in the last 168 hours.   Radiology    ECHO TEE  Result Date: 05/31/2021    TRANSESOPHOGEAL ECHO REPORT   Patient Name:   Cathy Moody Date of Exam: 05/31/2021 Medical Rec #:  [id number]     Height:       60.0 in Accession #:    [id number]    Weight:       217.4 lb Date of Birth:  12-09-1950      BSA:          1.934 m Patient Age:    70 years      BP:           122/77 mmHg Patient Gender: F             HR:           81 bpm. Exam Location:  Inpatient Procedure: Transesophageal Echo, Cardiac Doppler and Color Doppler Indications:     Endocarditis  History:         Patient has prior history of Echocardiogram examinations, most                  recent 05/29/2021.                  Aortic Valve: Edwards bioprosthetic valve is present in the                  aortic position.  Sonographer:     Margreta Journey RDCS Referring Phys:  [id number] [facility] NICOLE DUKE Diagnosing  Phys: Lavona Mound Tobb DO PROCEDURE: After discussion of the risks and benefits of a TEE, an informed consent was obtained from the patient. The transesophogeal probe was passed without difficulty through the esophogus of the patient. Imaged were obtained with the patient in a left lateral decubitus position. Local oropharyngeal anesthetic was provided with viscous lidocaine. Sedation performed by different physician. The patient was monitored while under deep sedation. Anesthestetic sedation was provided intravenously by Anesthesiology: 116mg  of Propofol. Image quality was adequate. The patient's vital signs; including heart rate, blood pressure, and oxygen saturation; remained stable throughout the procedure. The patient developed no complications during the procedure. IMPRESSIONS  1. Left ventricular ejection fraction, by estimation, is 60 to 65%. The left ventricle has normal function. The left ventricle has no regional wall motion abnormalities.  2. Right ventricular systolic function is normal. The right ventricular size is normal.  3. The mitral valve is normal in structure. Mild to moderate mitral valve regurgitation. No evidence of mitral stenosis.  4. There are two seperate highly mobile/oscillating masses appreciated on the aortic bioprosthesis highly suspected to be a vegetation. Aortic valve regurgitation is mild.  5. Severe aortic valve stenosis. 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 Aortic valve area, by VTI measures 0.43 cm. Aortic valve mean gradient measures 64.0 mmHg. Aortic valve Vmax measures 4.95 m/s.  6. The inferior vena cava is normal in size with greater than 50% respiratory variability, suggesting right atrial pressure of 3 mmHg.  7. No left atrial/left atrial appendage thrombus was detected. Conclusion(s)/Recommendation(s): Findings are concerning for vegetation/infective endocarditis as detailed above. May need Cardiac CTA for better visualization of  the  aortic annulus. FINDINGS  Left Ventricle: Left ventricular ejection fraction, by estimation, is 60 to 65%. The left ventricle has normal function. The left ventricle has no regional wall motion abnormalities. The left ventricular internal cavity size was normal in size. There is  no left ventricular hypertrophy. Right Ventricle: The right ventricular size is normal. No increase in right ventricular wall thickness. Right ventricular systolic function is normal. Left Atrium: Left atrial size was normal in size. No left atrial/left atrial appendage thrombus was detected. Right Atrium: Right atrial size was normal in size. Pericardium: There is no evidence of pericardial effusion. Mitral Valve: The mitral valve is normal in structure. Mild to moderate mitral valve regurgitation. No evidence of mitral valve stenosis. Tricuspid Valve: The tricuspid valve is normal in structure. Tricuspid valve regurgitation is not demonstrated. No evidence of tricuspid stenosis. Aortic Valve: There are two seperate highly mobile/oscillating masses appreciated on the aortic bioprosthesis highly suspected to be a vegetation. These can be seen seperately in the LVOT and the other on the aortic side of the valve. On the short axis veiw there is inward flow towards the aortic annulus that is not well defined. The aortic valve has been repaired/replaced. Aortic valve regurgitation is mild. Severe aortic stenosis is present. Aortic valve mean gradient measures 64.0 mmHg. Aortic valve  peak gradient measures 98.0 mmHg. Aortic valve area, by VTI measures 0.43 cm. 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 Pulmonic Valve: The pulmonic valve was normal in structure. Pulmonic valve regurgitation is not visualized. No evidence of pulmonic stenosis. Aorta: The aortic root is normal in size and structure. Venous: The inferior vena cava is normal in size with greater than 50% respiratory variability, suggesting right atrial  pressure of 3 mmHg. IAS/Shunts: No atrial level shunt detected by color flow Doppler.  LEFT VENTRICLE PLAX 2D LVOT diam:     2.00 cm LV SV:         50 LV SV Index:   26 LVOT Area:     3.14 cm  AORTIC VALVE AV Area (Vmax):    0.44 cm AV Area (Vmean):   0.39 cm AV Area (VTI):     0.43 cm AV Vmax:           495.00 cm/s AV Vmean:          386.000 cm/s AV VTI:            1.170 m AV Peak Grad:      98.0 mmHg AV Mean Grad:      64.0 mmHg LVOT Vmax:         70.00 cm/s LVOT Vmean:        48.000 cm/s LVOT VTI:          0.160 m LVOT/AV VTI ratio: 0.14  SHUNTS Systemic VTI:  0.16 m Systemic Diam: 2.00 cm Kardie Tobb DO Electronically signed by Thomasene Ripple DO Signature Date/Time: 05/31/2021/6:27:35 PM    Final     Cardiac Studies   TEE as described above.  Aortic valve TAVR valve endocarditis  Patient Profile     70 y.o. female TAVR valve placed in 2014 here with bioprosthetic aortic valve endocarditis.  Assessment & Plan    Bioprosthetic/TAVR valve endocarditis - This has been discussed with the structural heart team who places TAVR valves, Dr. Excell Seltzer.  TEE reviewed.  Restricted motion of leaflets noted with mobile densities consistent with vegetations.  One mobile density is quite bright in character, calcified  questioning the chronicity of the infection-in other words may have been chronic for quite some time. -Continue with antibiotics as directed by infectious disease.  Will need at least 6 weeks via PICC line. Pen G --Follow-up with gradients with repeat echocardiogram in 3 months.  If gradient is still high, consider anticoagulation for possible thrombus, but this is not recommended right now. -Follow-up with structural heart clinic with Dr. Excell Seltzer as well as Carlean Jews. --WBC improved to 15.9   Severe COPD - Options limited.  This was the original reason for her TAVR valve placement  Rash -- Seems to have progressed today, on back, upper arms, popliteal fossa.  She reminded [place] that she did  have a rash to Eliquis in the past.  She seems to be sensitive to various medications.  Dr. Hanley Ben will contact infectious disease team to see if penicillin should be changed.  For questions or updates, please contact [facility] Please consult [email or web address] for contact info under        Signed, Donato Schultz, MD  06/02/2021, 9:26 AM

## 2021-06-03 ENCOUNTER — Encounter: Payer: Self-pay | Admitting: Physician Assistant

## 2021-06-03 ENCOUNTER — Other Ambulatory Visit: Payer: Self-pay | Admitting: Physician Assistant

## 2021-06-03 DIAGNOSIS — Z952 Presence of prosthetic heart valve: Secondary | ICD-10-CM

## 2021-06-03 DIAGNOSIS — I358 Other nonrheumatic aortic valve disorders: Secondary | ICD-10-CM

## 2021-06-03 DIAGNOSIS — R21 Rash and other nonspecific skin eruption: Secondary | ICD-10-CM | POA: Diagnosis not present

## 2021-06-03 DIAGNOSIS — J9601 Acute respiratory failure with hypoxia: Secondary | ICD-10-CM | POA: Diagnosis not present

## 2021-06-03 DIAGNOSIS — G9341 Metabolic encephalopathy: Secondary | ICD-10-CM | POA: Diagnosis not present

## 2021-06-03 DIAGNOSIS — R6520 Severe sepsis without septic shock: Secondary | ICD-10-CM | POA: Diagnosis not present

## 2021-06-03 DIAGNOSIS — A419 Sepsis, unspecified organism: Secondary | ICD-10-CM | POA: Diagnosis not present

## 2021-06-03 LAB — BASIC METABOLIC PANEL
Anion gap: 6 (ref 5–15)
BUN: 12 mg/dL (ref 8–23)
CO2: 28 mmol/L (ref 22–32)
Calcium: 8.4 mg/dL — ABNORMAL LOW (ref 8.9–10.3)
Chloride: 102 mmol/L (ref 98–111)
Creatinine, Ser: 0.74 mg/dL (ref 0.44–1.00)
GFR, Estimated: >60 mL/min (ref >60)
Glucose, Bld: 152 mg/dL — ABNORMAL HIGH (ref 70–99)
Potassium: 4.4 mmol/L (ref 3.5–5.1)
Sodium: 136 mmol/L (ref 135–145)

## 2021-06-03 NOTE — Progress Notes (Signed)
Patient ID: Cathy Moody, female   DOB: [birth year], 70 y.o.   MRN: [id number]  PROGRESS NOTE    Cathy Moody  DGL:[id number] DOB: [birth year] DOA: 05/28/2021 PCP: [name], MD   Brief Narrative:  70 year old female with history of COPD not on home oxygen, alcohol use, pulmonary hypertension, obesity and TAVR presented with fever.  On presentation, CT of the chest showed no PE or infiltrates.  CT abdomen and pelvis was unremarkable.  UA was unremarkable.  She was started on broad-spectrum antibiotics.  Blood cultures grew Streptococcus mitis/oralis. Echo showed severe aortic stenosis and possible vegetation.  TEE confirmed aortic valve vegetation.  Currently on IV antibiotics as per ID.  Assessment & Plan:   Severe sepsis: Present on admission Streptococcus mitis/oralis bacteremia Aortic valve infective endocarditis in a patient with previous TAVR -Presented with fever, tachypnea, tachycardia and hypotension and acute metabolic encephalopathy possibly from bacteremia and endocarditis -Blood cultures growing Streptococcus mitis/oralis. -TTE showed possible aortic valve vegetation.  Status post TEE by cardiology on 05/31/2021 which showed highly mobile mass on the bioprosthetic aortic valve highly suspicious for vegetation with evidence of severe stenosis of the bioprosthetic valve.  Cardiology is recommending IV antibiotics for endocarditis: Might need follow-up in structural clinic as an outpatient. -Repeat blood cultures from today. -ID following and is recommending 6 weeks of antibiotics: Antibiotics have been switched to penicillin G on 05/31/2021 by ID and subsequently switched to IV Ancef on 06/02/2021 after discussion with ID because of worsening rash.  Ancef dose was not given this morning because of worsening rash. -Currently afebrile.  Leukocytosis -Improving.  No labs today.  Acute respiratory failure with hypoxia -Not on oxygen at home.  Required supplemental oxygen on  presentation.  Currently on 3 L oxygen via nasal cannula.  Acute metabolic encephalopathy -Possibly from sepsis.   -Mental status has much improved.  Rash -Patient started to have rash when she was already on cefepime.  Has some progression of her rash around her bilateral antecubital regions.  Continue monitoring.   -Discussed with Dr. Kris Hartmann on 06/02/2021 who recommended to switch IV antibiotics to Ancef.  Follow further ID recommendations.  Ancef dose not given this morning because of worsening rash.  Might have to use topical steroids.  Continue Atarax and calamine as needed  Hypomagnesemia Hypokalemia -Resolved  Alcohol abuse -Currently on CIWA protocol.  No signs of withdrawal.  DC thiamine, patient concerned that this is also new medication and might be causing the rashes as well  Hypothyroidism -Continue levothyroxine  Pulmonary hypertension -Cardiology following  Morbid obesity -Outpatient follow-up  COPD -Stable.  Continue Dulera and as needed bronchodilators  Abnormal TSH -Repeat TSH after acute illness   DVT prophylaxis: Lovenox Code Status: Full Family Communication: Mother at bedside on 06/02/2021 Disposition Plan: Status is: Inpatient  Remains inpatient appropriate because:Inpatient level of care appropriate due to severity of illness  Dispo: The patient is from: Home              Anticipated d/c is to: Home              Patient currently is not medically stable to d/c.   Difficult to place patient No  Consultants: Cardiology/ID.  Procedures: 2D echo/TEE  Antimicrobials:  Anti-infectives (From admission, onward)    Start     Dose/Rate Route Frequency Ordered Stop   06/02/21 1300  ceFAZolin (ANCEF) IVPB 2g/100 mL premix        2 g 200 mL/hr over 30  Minutes Intravenous Every 8 hours 06/02/21 1231     05/31/21 1800  penicillin G potassium 12 Million Units in dextrose 5 % 500 mL continuous infusion  Status:  Discontinued        12 Million  Units 41.7 mL/hr over 12 Hours Intravenous Every 12 hours 05/31/21 1417 06/02/21 1204   05/30/21 1400  vancomycin (VANCOCIN) IVPB 1000 mg/200 mL premix  Status:  Discontinued        1,000 mg 200 mL/hr over 60 Minutes Intravenous Every 36 hours 05/29/21 0250 05/29/21 0956   05/29/21 2200  vancomycin (VANCOCIN) IVPB 1000 mg/200 mL premix  Status:  Discontinued        1,000 mg 200 mL/hr over 60 Minutes Intravenous Every 24 hours 05/29/21 0956 05/29/21 1544   05/29/21 1800  cefTRIAXone (ROCEPHIN) 2 g in sodium chloride 0.9 % 100 mL IVPB  Status:  Discontinued        2 g 200 mL/hr over 30 Minutes Intravenous Every 24 hours 05/29/21 1544 05/31/21 1757   05/29/21 1400  ceFEPIme (MAXIPIME) 2 g in sodium chloride 0.9 % 100 mL IVPB  Status:  Discontinued        2 g 200 mL/hr over 30 Minutes Intravenous Every 12 hours 05/29/21 0250 05/29/21 1000   05/29/21 1100  ceFEPIme (MAXIPIME) 2 g in sodium chloride 0.9 % 100 mL IVPB  Status:  Discontinued        2 g 200 mL/hr over 30 Minutes Intravenous Every 12 hours 05/29/21 1000 05/29/21 1544   05/29/21 0115  metroNIDAZOLE (FLAGYL) IVPB 500 mg  Status:  Discontinued        500 mg 100 mL/hr over 60 Minutes Intravenous Every 12 hours 05/29/21 0102 05/30/21 1242   05/29/21 0115  vancomycin (VANCOCIN) IVPB 1000 mg/200 mL premix        1,000 mg 200 mL/hr over 60 Minutes Intravenous  Once 05/29/21 0110 05/29/21 0317   05/29/21 0115  ceFEPIme (MAXIPIME) 2 g in sodium chloride 0.9 % 100 mL IVPB        2 g 200 mL/hr over 30 Minutes Intravenous  Once 05/29/21 0110 05/29/21 0317   05/28/21 2015  cefTRIAXone (ROCEPHIN) 2 g in sodium chloride 0.9 % 100 mL IVPB  Status:  Discontinued        2 g 200 mL/hr over 30 Minutes Intravenous Every 24 hours 05/28/21 2000 05/29/21 0110   05/28/21 2015  azithromycin (ZITHROMAX) 500 mg in sodium chloride 0.9 % 250 mL IVPB  Status:  Discontinued        500 mg 250 mL/hr over 60 Minutes Intravenous Every 24 hours 05/28/21 2000  05/29/21 0110         Subjective: Patient seen and examined at bedside.  No fever, worsening shortness of breath, chest pain, vomiting reported.  Feels that rash is worsening with intermittent itching. Objective: Vitals:   06/02/21 1223 06/02/21 1935 06/02/21 1939 06/03/21 0510  BP: (!) 148/68  122/78 111/79  Pulse: 78  84 80  Resp: Temp: 99.5 F (37.5 C)  98.1 F (36.7 C) 97.7 F (36.5 C)  TempSrc: Axillary  Oral Oral  SpO2: 98% 100% 99% 93%  Weight:      Height:        Intake/Output Summary (Last 24 hours) at 06/03/2021 0746 Last data filed at 06/03/2021 0400 Gross per 24 hour  Intake 2126.17 ml  Output --  Net 2126.17 ml    Filed Weights   05/29/21  0123 05/29/21 0443 05/31/21 1200  Weight: 89.8 kg 98.6 kg 98.6 kg    Examination:  General exam: Currently on 3 L oxygen via nasal cannula.  No distress.  Looks chronically ill and deconditioned. Respiratory system: Decreased breath sounds at bases bilaterally with some scattered crackles  cardiovascular system: Rate controlled, S1-S2 heard gastrointestinal system: Abdomen is slightly distended, morbidly obese, soft and nontender.  Normal bowel sounds heard extremities: Mild lower extremity edema present; no clubbing Central nervous system: Awake and alert, slow to respond.  No focal neurological deficits.  Moves extremities skin: Erythematous maculopapular rash on upper and lower back and around bilateral antecubital regions and lower arms and now on abdominal wall as well.  No ecchymosis psychiatry: Flat affect  Data Reviewed: I have personally reviewed following labs and imaging studies  CBC: Recent Labs  Lab 05/28/21 1941 05/29/21 0639 05/30/21 0457 05/31/21 0445 06/02/21 0459  WBC 33.6* 34.0* 31.4* 24.6* 15.9*  NEUTROABS 29.9* 32.0*  --   --  12.7*  HGB 13.3 11.8* 11.8* 11.7* 11.5*  HCT 39.8 34.5* 35.2* 34.8* 35.5*  MCV 98.0 99.1 100.0 99.4 101.1*  PLT 288 269 265 261 241    Basic  Metabolic Panel: Recent Labs  Lab 05/28/21 1941 05/29/21 0639 05/30/21 0457 05/31/21 0445 06/02/21 0459 06/03/21 0453  NA 136 142 144 138 138 136  K 3.4* 4.2 4.3 3.8 4.3 4.4  CL 100 106 113* 105 103 102  CO2 24 26 25 26 30 28   GLUCOSE 170* 268* 216* 163* 160* 152*  BUN 17 14 14 16 10 12   CREATININE 1.29* 0.92 0.89 0.83 0.72 0.74  CALCIUM 8.7* 8.7* 9.1 8.1* 8.2* 8.4*  MG 1.6* 2.5* 2.1  --  1.9  --   PHOS  --  4.0  --   --   --   --     GFR: Estimated Creatinine Clearance: 69.9 mL/min (by C-G formula based on SCr of 0.74 mg/dL). Liver Function Tests: Recent Labs  Lab 05/28/21 1941 05/29/21 0639  AST 26 25  ALT 19 19  ALKPHOS 95 73  BILITOT 1.4* 0.4  PROT 7.3 6.8  ALBUMIN 3.8 3.1*    No results for input(s): LIPASE, AMYLASE in the last 168 hours. Recent Labs  Lab 05/28/21 2330  AMMONIA 32    Coagulation Profile: Recent Labs  Lab 05/28/21 1941  INR 1.1    Cardiac Enzymes: Recent Labs  Lab 05/28/21 1941  CKTOTAL 62    BNP (last 3 results) No results for input(s): PROBNP in the last 8760 hours. HbA1C: No results for input(s): HGBA1C in the last 72 hours.  CBG: Recent Labs  Lab 05/29/21 1855 05/29/21 2101 05/30/21 0735 05/30/21 1208  GLUCAP 214* 299* 188* 199*    Lipid Profile: No results for input(s): CHOL, HDL, LDLCALC, TRIG, CHOLHDL, LDLDIRECT in the last 72 hours. Thyroid Function Tests: No results for input(s): TSH, T4TOTAL, FREET4, T3FREE, THYROIDAB in the last 72 hours.  Anemia Panel: No results for input(s): VITAMINB12, FOLATE, FERRITIN, TIBC, IRON, RETICCTPCT in the last 72 hours. Sepsis Labs: Recent Labs  Lab 05/28/21 1941 05/29/21 0000  LATICACIDVEN 1.6 1.5     Recent Results (from the past 240 hour(s))  Resp Panel by RT-PCR (Flu A&B, Covid) Nasopharyngeal Swab     Status: None   Collection Time: 05/28/21  7:41 PM   Specimen: Nasopharyngeal Swab; Nasopharyngeal(NP) swabs in vial transport medium  Result Value Ref Range  Status   SARS Coronavirus 2 by RT PCR  NEGATIVE NEGATIVE Final    Comment: (NOTE) SARS-CoV-2 target nucleic acids are NOT DETECTED.  The SARS-CoV-2 RNA is generally detectable in upper respiratory specimens during the acute phase of infection. The lowest concentration of SARS-CoV-2 viral copies this assay can detect is 138 copies/mL. A negative result does not preclude SARS-Cov-2 infection and should not be used as the sole basis for treatment or other patient management decisions. A negative result may occur with  improper specimen collection/handling, submission of specimen other than nasopharyngeal swab, presence of viral mutation(s) within the areas targeted by this assay, and inadequate number of viral copies(<138 copies/mL). A negative result must be combined with clinical observations, patient history, and epidemiological information. The expected result is Negative.  Fact Sheet for Patients:  BloggerCourse.com  Fact Sheet for Healthcare Providers:  SeriousBroker.it  This test is no t yet approved or cleared by the [place] FDA and  has been authorized for detection and/or diagnosis of SARS-CoV-2 by FDA under an Emergency Use Authorization (EUA). This EUA will remain  in effect (meaning this test can be used) for the duration of the COVID-19 declaration under Section 564(b)(1) of the Act, 21 U.S.C.section 360bbb-3(b)(1), unless the authorization is terminated  or revoked sooner.       Influenza A by PCR NEGATIVE NEGATIVE Final   Influenza B by PCR NEGATIVE NEGATIVE Final    Comment: (NOTE) The Xpert Xpress SARS-CoV-2/FLU/RSV plus assay is intended as an aid in the diagnosis of influenza from Nasopharyngeal swab specimens and should not be used as a sole basis for treatment. Nasal washings and aspirates are unacceptable for Xpert Xpress SARS-CoV-2/FLU/RSV testing.  Fact Sheet for  Patients: BloggerCourse.com  Fact Sheet for Healthcare Providers: SeriousBroker.it  This test is not yet approved or cleared by the [place] FDA and has been authorized for detection and/or diagnosis of SARS-CoV-2 by FDA under an Emergency Use Authorization (EUA). This EUA will remain in effect (meaning this test can be used) for the duration of the COVID-19 declaration under Section 564(b)(1) of the Act, 21 U.S.C. section 360bbb-3(b)(1), unless the authorization is terminated or revoked.  Performed at [facility], [address], [place], [place] [address]   Blood Culture (routine x 2)     Status: Abnormal   Collection Time: 05/28/21  7:41 PM   Specimen: BLOOD  Result Value Ref Range Status   Specimen Description   Final    BLOOD BLOOD RIGHT FOREARM Performed at [facility], [address] [place], [place] [address]    Special Requests   Final    BOTTLES DRAWN AEROBIC AND ANAEROBIC Blood Culture results may not be optimal due to an excessive volume of blood received in culture bottles Performed at [facility], [address], [place], [place] [address]    Culture  Setup Time   Final    GRAM POSITIVE COCCI IN CHAINS IN BOTH AEROBIC AND ANAEROBIC BOTTLES CRITICAL VALUE NOTED.  VALUE IS CONSISTENT WITH PREVIOUSLY REPORTED AND CALLED VALUE.    Culture (A)  Final    STREPTOCOCCUS MITIS/ORALIS SUSCEPTIBILITIES PERFORMED ON PREVIOUS CULTURE WITHIN THE LAST 5 DAYS. Performed at [facility], [address], [place], [place] [address]    Report Status 05/31/2021 FINAL  Final  Blood Culture (routine x 2)     Status: Abnormal   Collection Time: 05/28/21  7:46 PM   Specimen: BLOOD LEFT FOREARM  Result Value Ref Range Status   Specimen Description  Final    BLOOD LEFT FOREARM Performed at [facility], [address] [place], [place] [address]    Special  Requests   Final    BOTTLES DRAWN AEROBIC AND ANAEROBIC Blood Culture results may not be optimal due to an excessive volume of blood received in culture bottles Performed at [facility], [address] [place], [place] [address]    Culture  Setup Time   Final    GRAM POSITIVE COCCI IN CHAINS IN BOTH AEROBIC AND ANAEROBIC BOTTLES CRITICAL RESULT CALLED TO, READ BACK BY AND VERIFIED WITH: PHARMD CHRISTINE S. [id number] FCP Performed at [facility], [address] [place], [place] [address]    Culture STREPTOCOCCUS MITIS/ORALIS (A)  Final   Report Status 05/31/2021 FINAL  Final   Organism ID, Bacteria STREPTOCOCCUS MITIS/ORALIS  Final      Susceptibility   Streptococcus mitis/oralis - MIC*    TETRACYCLINE 0.5 SENSITIVE Sensitive     VANCOMYCIN 0.5 SENSITIVE Sensitive     CLINDAMYCIN <=0.25 SENSITIVE Sensitive     PENICILLIN Value in next row Sensitive      SENSITIVEMIC =0.06S    * STREPTOCOCCUS MITIS/ORALIS  Blood Culture ID Panel (Reflexed)     Status: Abnormal   Collection Time: 05/28/21  7:46 PM  Result Value Ref Range Status   Enterococcus faecalis NOT DETECTED NOT DETECTED Final   Enterococcus Faecium NOT DETECTED NOT DETECTED Final   Listeria monocytogenes NOT DETECTED NOT DETECTED Final   Staphylococcus species NOT DETECTED NOT DETECTED Final   Staphylococcus aureus (BCID) NOT DETECTED NOT DETECTED Final   Staphylococcus epidermidis NOT DETECTED NOT DETECTED Final   Staphylococcus lugdunensis NOT DETECTED NOT DETECTED Final   Streptococcus species DETECTED (A) NOT DETECTED Final    Comment: Not Enterococcus species, Streptococcus agalactiae, Streptococcus pyogenes, or Streptococcus pneumoniae. CRITICAL RESULT CALLED TO, READ BACK BY AND VERIFIED WITH: PHARMD CHRISTINE S. [id number] FCP    Streptococcus agalactiae NOT DETECTED NOT DETECTED Final   Streptococcus pneumoniae NOT DETECTED NOT DETECTED Final   Streptococcus pyogenes NOT DETECTED NOT  DETECTED Final   A.calcoaceticus-baumannii NOT DETECTED NOT DETECTED Final   Bacteroides fragilis NOT DETECTED NOT DETECTED Final   Enterobacterales NOT DETECTED NOT DETECTED Final   Enterobacter cloacae complex NOT DETECTED NOT DETECTED Final   Escherichia coli NOT DETECTED NOT DETECTED Final   Klebsiella aerogenes NOT DETECTED NOT DETECTED Final   Klebsiella oxytoca NOT DETECTED NOT DETECTED Final   Klebsiella pneumoniae NOT DETECTED NOT DETECTED Final   Proteus species NOT DETECTED NOT DETECTED Final   Salmonella species NOT DETECTED NOT DETECTED Final   Serratia marcescens NOT DETECTED NOT DETECTED Final   Haemophilus influenzae NOT DETECTED NOT DETECTED Final   Neisseria meningitidis NOT DETECTED NOT DETECTED Final   Pseudomonas aeruginosa NOT DETECTED NOT DETECTED Final   Stenotrophomonas maltophilia NOT DETECTED NOT DETECTED Final   Candida albicans NOT DETECTED NOT DETECTED Final   Candida auris NOT DETECTED NOT DETECTED Final   Candida glabrata NOT DETECTED NOT DETECTED Final   Candida krusei NOT DETECTED NOT DETECTED Final   Candida parapsilosis NOT DETECTED NOT DETECTED Final   Candida tropicalis NOT DETECTED NOT DETECTED Final   Cryptococcus neoformans/gattii NOT DETECTED NOT DETECTED Final    Comment: Performed at [facility], [address] [place], [place] [address]  Urine Culture     Status: None   Collection Time: 05/28/21 10:37 PM   Specimen: In/Out Cath Urine  Result Value Ref Range Status   Specimen Description   Final    IN/OUT CATH URINE Performed at [facility], [address], [place], [place] [address]    Special Requests   Final    NONE Performed at [facility], [address], [place], [place] [address]    Culture   Final    NO GROWTH Performed at [facility], [address], [place], [place] [address]    Report Status 05/29/2021 FINAL  Final  Respiratory (~20 pathogens) panel by PCR     Status:  None   Collection Time: 05/28/21 11:07 PM   Specimen: Nasopharyngeal Swab; Respiratory  Result Value Ref Range Status   Adenovirus NOT DETECTED NOT DETECTED Final   Coronavirus 229E NOT DETECTED NOT DETECTED Final    Comment: (NOTE) The Coronavirus on the Respiratory Panel, DOES NOT test for the novel  Coronavirus (2019 nCoV)    Coronavirus HKU1 NOT DETECTED NOT DETECTED Final   Coronavirus NL63 NOT DETECTED NOT DETECTED Final   Coronavirus OC43 NOT DETECTED NOT DETECTED Final   Metapneumovirus NOT DETECTED NOT DETECTED Final   Rhinovirus / Enterovirus NOT DETECTED NOT DETECTED Final   Influenza A NOT DETECTED NOT DETECTED Final   Influenza B NOT DETECTED NOT DETECTED Final   Parainfluenza Virus 1 NOT DETECTED NOT DETECTED Final   Parainfluenza Virus 2 NOT DETECTED NOT DETECTED Final   Parainfluenza Virus 3 NOT DETECTED NOT DETECTED Final   Parainfluenza Virus 4 NOT DETECTED NOT DETECTED Final   Respiratory Syncytial Virus NOT DETECTED NOT DETECTED Final   Bordetella pertussis NOT DETECTED NOT DETECTED Final   Bordetella Parapertussis NOT DETECTED NOT DETECTED Final   Chlamydophila pneumoniae NOT DETECTED NOT DETECTED Final   Mycoplasma pneumoniae NOT DETECTED NOT DETECTED Final    Comment: Performed at [facility], [address] [place], [place] [address]  MRSA Next Gen by PCR, Nasal     Status: None   Collection Time: 05/29/21  6:38 AM   Specimen: Nasal Mucosa; Nasal Swab  Result Value Ref Range Status   MRSA by PCR Next Gen NOT DETECTED NOT DETECTED Final    Comment: (NOTE) The GeneXpert MRSA Assay (FDA approved for NASAL specimens only), is one component of a comprehensive MRSA colonization surveillance program. It is not intended to diagnose MRSA infection nor to guide or monitor treatment for MRSA infections. Test performance is not FDA approved in patients less than 67 years old. Performed at [facility], [address], [place], [place]  [address]   Culture, blood (routine x 2)     Status: None (Preliminary result)   Collection Time: 06/01/21  5:30 AM   Specimen: BLOOD  Result Value Ref Range Status   Specimen Description   Final    BLOOD RIGHT ANTECUBITAL Performed at [facility], [address], [place], [place] [address]    Special Requests   Final    BOTTLES DRAWN AEROBIC ONLY Blood Culture adequate volume Performed at [facility], [address], [place], [place] [address]    Culture   Final    NO GROWTH 1 DAY Performed at [facility], [address], [place], [place] [address]    Report Status PENDING  Incomplete          Radiology Studies: No results found.      Scheduled Meds:  aspirin EC  81 mg Oral Daily  enoxaparin (LOVENOX) injection  40 mg Subcutaneous Q24H   levothyroxine  112 mcg Oral Q0600   mometasone-formoterol  2 puff Inhalation BID   thiamine  100 mg Oral Daily   Or   thiamine  100 mg Intravenous Daily   Continuous Infusions:  sodium chloride Stopped (06/02/21 1532)    ceFAZolin (ANCEF) IV 2 g (06/02/21 2029)          Glade Lloyd, MD [facility] 06/03/2021, 7:46 AM

## 2021-06-03 NOTE — Plan of Care (Signed)

## 2021-06-03 NOTE — Care Management Important Message (Signed)
Important Message  Patient Details IM Letter given to the Patient. Name: Cathy Moody MRN: [id number] Date of Birth: 05/20/1951   Medicare Important Message Given:  Yes     Caren Macadam 06/03/2021, 1:04 PM

## 2021-06-03 NOTE — Progress Notes (Addendum)
Progress Note  Patient Name: Cathy Moody Date of Encounter: 06/03/2021  [facility] Cardiologist: Dietrich Pates, MD   Subjective   No chest pain. Concerned about rash and ABX. Rash occurred after TEE - question allergy to IV medication during TEE.  Inpatient Medications    Scheduled Meds:  aspirin EC  81 mg Oral Daily   enoxaparin (LOVENOX) injection  40 mg Subcutaneous Q24H   levothyroxine  112 mcg Oral Q0600   mometasone-formoterol  2 puff Inhalation BID   Continuous Infusions:  sodium chloride Stopped (06/02/21 1532)    ceFAZolin (ANCEF) IV 2 g (06/02/21 2029)   PRN Meds: sodium chloride, acetaminophen **OR** acetaminophen, albuterol, albuterol, calamine, doxylamine (Sleep), HYDROcodone-acetaminophen, hydrOXYzine, ipratropium-albuterol   Vital Signs    Vitals:   06/02/21 1935 06/02/21 1939 06/03/21 0510 06/03/21 0815  BP:  122/78 111/79   Pulse:  84 80   Resp:  20 16   Temp:  98.1 F (36.7 C) 97.7 F (36.5 C)   TempSrc:  Oral Oral   SpO2: 100% 99% 93% 91%  Weight:      Height:        Intake/Output Summary (Last 24 hours) at 06/03/2021 1127 Last data filed at 06/03/2021 0900 Gross per 24 hour  Intake 1432.17 ml  Output --  Net 1432.17 ml   Last 3 Weights 05/31/2021 05/29/2021 05/29/2021  Weight (lbs) 217 lb 6 oz 217 lb 6 oz 198 lb  Weight (kg) 98.6 kg 98.6 kg 89.812 kg      Telemetry    Sinus 70-90s, PVCs - Personally Reviewed  ECG    No new tracings - Personally Reviewed  Physical Exam   GEN: No acute distress.   Neck: No JVD Cardiac: RRR, 4/6 systolic murmur, no S2.  Respiratory: diminished throughout GI: Soft, nontender, non-distended  MS: No edema; No deformity. Neuro:  Nonfocal  Psych: Normal affect   Labs    High Sensitivity Troponin:  No results for input(s): TROPONINIHS in the last 720 hours.   Chemistry Recent Labs  Lab 05/28/21 1941 05/29/21 0639 05/30/21 0457 05/31/21 0445 06/02/21 0459 06/03/21 0453  NA 136 142  144 138 138 136  K 3.4* 4.2 4.3 3.8 4.3 4.4  CL 100 106 113* 105 103 102  CO2 24 26 25 26 30 28   GLUCOSE 170* 268* 216* 163* 160* 152*  BUN 17 14 14 16 10 12   CREATININE 1.29* 0.92 0.89 0.83 0.72 0.74  CALCIUM 8.7* 8.7* 9.1 8.1* 8.2* 8.4*  MG 1.6* 2.5* 2.1  --  1.9  --   PROT 7.3 6.8  --   --   --   --   ALBUMIN 3.8 3.1*  --   --   --   --   AST 26 25  --   --   --   --   ALT 19 19  --   --   --   --   ALKPHOS 95 73  --   --   --   --   BILITOT 1.4* 0.4  --   --   --   --   GFRNONAA 45* >60 >60 >60 >60 >60  ANIONGAP 12 10 6 7 5 6     Lipids No results for input(s): CHOL, TRIG, HDL, LABVLDL, LDLCALC, CHOLHDL in the last 168 hours.  Hematology Recent Labs  Lab 05/30/21 0457 05/31/21 0445 06/02/21 0459  WBC 31.4* 24.6* 15.9*  RBC 3.52* 3.50* 3.51*  HGB 11.8* 11.7*  11.5*  HCT 35.2* 34.8* 35.5*  MCV 100.0 99.4 101.1*  MCH 33.5 33.4 32.8  MCHC 33.5 33.6 32.4  RDW 12.8 12.9 12.7  PLT 265 261 241   Thyroid  Recent Labs  Lab 05/29/21 0639  TSH 10.760*    BNPNo results for input(s): BNP, PROBNP in the last 168 hours.  DDimer No results for input(s): DDIMER in the last 168 hours.   Radiology    No results found.  Cardiac Studies   TEE 05/31/21:  1. Left ventricular ejection fraction, by estimation, is 60 to 65%. The  left ventricle has normal function. The left ventricle has no regional  wall motion abnormalities.   2. Right ventricular systolic function is normal. The right ventricular  size is normal.   3. The mitral valve is normal in structure. Mild to moderate mitral valve  regurgitation. No evidence of mitral stenosis.   4. There are two seperate highly mobile/oscillating masses appreciated on  the aortic bioprosthesis highly suspected to be a vegetation. Aortic valve  regurgitation is mild.   5. Severe aortic valve stenosis. There is a Edwards bioprosthetic valve  present in the aortic position. Aortic valve area, by VTI measures 0.43  cm. Aortic valve mean  gradient measures 64.0 mmHg. Aortic valve Vmax  measures 4.95 m/s.   6. The inferior vena cava is normal in size with greater than 50%  respiratory variability, suggesting right atrial pressure of 3 mmHg.   7. No left atrial/left atrial appendage thrombus was detected.    Echo 05/29/21: 1. Left ventricular ejection fraction, by estimation, is 60 to 65%. The  left ventricle has normal function. The left ventricle has no regional  wall motion abnormalities. There is moderate left ventricular hypertrophy.  Left ventricular diastolic  parameters are consistent with Grade I diastolic dysfunction (impaired  relaxation). Elevated left ventricular end-diastolic pressure. The E/e' is  17.   2. Right ventricular systolic function is low normal. The right  ventricular size is normal. There is normal pulmonary artery systolic  pressure. The estimated right ventricular systolic pressure is 26.2 mmHg.   3. Left atrial size was moderately dilated.   4. Right atrial size was mildly dilated.   5. The mitral valve is abnormal. Trivial mitral valve regurgitation.  Moderate mitral annular calcification.   6. Prostetic valve EOA is 0.46 cm2 - the AT is prolonged at 123 msec, jet  contour is rounded, DI is 0.15 -features consistent with severe prosthetic  valve stenosis. The aortic valve has been repaired/replaced. Aortic valve  regurgitation is not  visualized. Critical aortic stenosis. There is a 29 mm Sapien prosthetic  (TAVR) valve present in the aortic position. Procedure Date: 08/09/13.  Echo findings are consistent with stenosis and probable vegetation of the  aortic prosthesis. Aortic valve  area, by VTI measures 0.46 cm. Aortic valve mean gradient measures 93.2  mmHg. Aortic valve Vmax measures 5.82 m/s.   7. The inferior vena cava is normal in size with greater than 50%  respiratory variability, suggesting right atrial pressure of 3 mmHg.   Patient Profile     70 y.o. female with a hx of AS  s/p TAVR in 2014, chronic systolic heart failure, NICM, tobacco abuse, COPD, and morbid obesity who is being seen 05/30/2021 for the evaluation of AV vegetation.  Assessment & Plan    TAVR valve endocarditis - ID following for ABX and rash - case discussed with structural heart team (Dr. Excell Seltzer) - TEE reviewed with calcification of  one vegetation suggesting a chronic endocarditis - repeat echo in 3 months - if gradient still high, consider anticoagulation for possible thrombus (OAC not recommended at this time) - of note, pt did not tolerate eliquis in the past (rash) - will follow up with structural heart team Cathy Moody, Dr. Excell Seltzer) - leukocytosis improving   HFrEF - LVEF improved after TAVR - heart cath 2014 negative for obstructive disease   COPD Acute respiratory failure Smoker Anemia - stable - per primary   Rash - slightly raised, pruritic - appeared after TEE - question reaction to IV medication during TEE - may need short Moody of steroids, but will defer to primary/ID - atarax PRN   I have reached out to structural heart team to arrange follow up. Will also need echo in 3 months.   For questions or updates, please contact [facility] Please consult [email or web address] for contact info under        Signed, Cathy Duster, PA  06/03/2021, 11:27 AM    History and all data above reviewed.  Patient examined.  I agree with the findings as above.  The patient exam reveals COR:RRR  ,  Lungs: Clear  ,  Abd: Positive bowel sounds, no rebound no guarding, Ext no edema  .  All available labs, radiology testing, previous records reviewed. Agree with documented assessment and plan.    Endocarditis:  Plan as above.  We will arrange follow up.    Cathy Moody  12:41 PM  06/03/2021

## 2021-06-03 NOTE — Progress Notes (Signed)
INFECTIOUS DISEASE PROGRESS NOTE  ID: Cathy Moody is a 70 y.o. female with  Principal Problem:   Severe sepsis (HCC) Active Problems:   COPD, severe (HCC)   Pulmonary hypertension, moderate to severe (HCC)   S/P TAVR (transcatheter aortic valve replacement)   Morbid obesity (HCC)   Hypothyroidism   Acute metabolic encephalopathy   Alcohol abuse   Acute respiratory failure with hypoxia (HCC)   Hypomagnesemia   Infective endocarditis of aortic valve   Aortic valve endocarditis  Subjective: Denies sob, cough, dysphagia, oral sores.  Believes rash started prior to PEN.   Abtx:  Anti-infectives (From admission, onward)    Start     Dose/Rate Route Frequency Ordered Stop   06/02/21 1300  ceFAZolin (ANCEF) IVPB 2g/100 mL premix        2 g 200 mL/hr over 30 Minutes Intravenous Every 8 hours 06/02/21 1231     05/31/21 1800  penicillin G potassium 12 Million Units in dextrose 5 % 500 mL continuous infusion  Status:  Discontinued        12 Million Units 41.7 mL/hr over 12 Hours Intravenous Every 12 hours 05/31/21 1417 06/02/21 1204   05/30/21 1400  vancomycin (VANCOCIN) IVPB 1000 mg/200 mL premix  Status:  Discontinued        1,000 mg 200 mL/hr over 60 Minutes Intravenous Every 36 hours 05/29/21 0250 05/29/21 0956   05/29/21 2200  vancomycin (VANCOCIN) IVPB 1000 mg/200 mL premix  Status:  Discontinued        1,000 mg 200 mL/hr over 60 Minutes Intravenous Every 24 hours 05/29/21 0956 05/29/21 1544   05/29/21 1800  cefTRIAXone (ROCEPHIN) 2 g in sodium chloride 0.9 % 100 mL IVPB  Status:  Discontinued        2 g 200 mL/hr over 30 Minutes Intravenous Every 24 hours 05/29/21 1544 05/31/21 1757   05/29/21 1400  ceFEPIme (MAXIPIME) 2 g in sodium chloride 0.9 % 100 mL IVPB  Status:  Discontinued        2 g 200 mL/hr over 30 Minutes Intravenous Every 12 hours 05/29/21 0250 05/29/21 1000   05/29/21 1100  ceFEPIme (MAXIPIME) 2 g in sodium chloride 0.9 % 100 mL IVPB  Status:  Discontinued         2 g 200 mL/hr over 30 Minutes Intravenous Every 12 hours 05/29/21 1000 05/29/21 1544   05/29/21 0115  metroNIDAZOLE (FLAGYL) IVPB 500 mg  Status:  Discontinued        500 mg 100 mL/hr over 60 Minutes Intravenous Every 12 hours 05/29/21 0102 05/30/21 1242   05/29/21 0115  vancomycin (VANCOCIN) IVPB 1000 mg/200 mL premix        1,000 mg 200 mL/hr over 60 Minutes Intravenous  Once 05/29/21 0110 05/29/21 0317   05/29/21 0115  ceFEPIme (MAXIPIME) 2 g in sodium chloride 0.9 % 100 mL IVPB        2 g 200 mL/hr over 30 Minutes Intravenous  Once 05/29/21 0110 05/29/21 0317   05/28/21 2015  cefTRIAXone (ROCEPHIN) 2 g in sodium chloride 0.9 % 100 mL IVPB  Status:  Discontinued        2 g 200 mL/hr over 30 Minutes Intravenous Every 24 hours 05/28/21 2000 05/29/21 0110   05/28/21 2015  azithromycin (ZITHROMAX) 500 mg in sodium chloride 0.9 % 250 mL IVPB  Status:  Discontinued        500 mg 250 mL/hr over 60 Minutes Intravenous Every 24 hours 05/28/21 2000 05/29/21  0110       Medications: Scheduled:  aspirin EC  81 mg Oral Daily   enoxaparin (LOVENOX) injection  40 mg Subcutaneous Q24H   levothyroxine  112 mcg Oral Q0600   mometasone-formoterol  2 puff Inhalation BID    Objective: Vital signs in last 24 hours: Temp:  [97.7 F (36.5 C)-99.5 F (37.5 C)] 97.7 F (36.5 C) (10/10 0510) Pulse Rate:  [78-84] 80 (10/10 0510) Resp:  [16-20] 16 (10/10 0510) BP: (111-148)/(68-79) 111/79 (10/10 0510) SpO2:  [91 %-100 %] 91 % (10/10 0815)   Resp: clear to auscultation bilaterally Cardio: regular rate and rhythm and systolic murmur: early systolic 3/6, blowing at 2nd right intercostal space GI: soft, non-tender; bowel sounds normal; no masses,  no organomegaly Skin: erythema - back, chest, axilla  Lab Results Recent Labs    06/02/21 0459 06/03/21 0453  WBC 15.9*  --   HGB 11.5*  --   HCT 35.5*  --   NA 138 136  K 4.3 4.4  CL 103 102  CO2 30 28  BUN 10 12  CREATININE 0.72 0.74    Liver Panel No results for input(s): PROT, ALBUMIN, AST, ALT, ALKPHOS, BILITOT, BILIDIR, IBILI in the last 72 hours. Sedimentation Rate No results for input(s): ESRSEDRATE in the last 72 hours. C-Reactive Protein Recent Labs    06/02/21 0459  CRP 4.6*    Microbiology: Recent Results (from the past 240 hour(s))  Resp Panel by RT-PCR (Flu A&B, Covid) Nasopharyngeal Swab     Status: None   Collection Time: 05/28/21  7:41 PM   Specimen: Nasopharyngeal Swab; Nasopharyngeal(NP) swabs in vial transport medium  Result Value Ref Range Status   SARS Coronavirus 2 by RT PCR NEGATIVE NEGATIVE Final    Comment: (NOTE) SARS-CoV-2 target nucleic acids are NOT DETECTED.  The SARS-CoV-2 RNA is generally detectable in upper respiratory specimens during the acute phase of infection. The lowest concentration of SARS-CoV-2 viral copies this assay can detect is 138 copies/mL. A negative result does not preclude SARS-Cov-2 infection and should not be used as the sole basis for treatment or other patient management decisions. A negative result may occur with  improper specimen collection/handling, submission of specimen other than nasopharyngeal swab, presence of viral mutation(s) within the areas targeted by this assay, and inadequate number of viral copies(<138 copies/mL). A negative result must be combined with clinical observations, patient history, and epidemiological information. The expected result is Negative.  Fact Sheet for Patients:  BloggerCourse.com  Fact Sheet for Healthcare Providers:  SeriousBroker.it  This test is no t yet approved or cleared by the [place] FDA and  has been authorized for detection and/or diagnosis of SARS-CoV-2 by FDA under an Emergency Use Authorization (EUA). This EUA will remain  in effect (meaning this test can be used) for the duration of the COVID-19 declaration under Section 564(b)(1) of the Act,  21 U.S.C.section 360bbb-3(b)(1), unless the authorization is terminated  or revoked sooner.       Influenza A by PCR NEGATIVE NEGATIVE Final   Influenza B by PCR NEGATIVE NEGATIVE Final    Comment: (NOTE) The Xpert Xpress SARS-CoV-2/FLU/RSV plus assay is intended as an aid in the diagnosis of influenza from Nasopharyngeal swab specimens and should not be used as a sole basis for treatment. Nasal washings and aspirates are unacceptable for Xpert Xpress SARS-CoV-2/FLU/RSV testing.  Fact Sheet for Patients: BloggerCourse.com  Fact Sheet for Healthcare Providers: SeriousBroker.it  This test is not yet approved or cleared by  the [employer] and has been authorized for detection and/or diagnosis of SARS-CoV-2 by FDA under an Emergency Use Authorization (EUA). This EUA will remain in effect (meaning this test can be used) for the duration of the COVID-19 declaration under Section 564(b)(1) of the Act, 21 U.S.C. section 360bbb-3(b)(1), unless the authorization is terminated or revoked.  Performed at [facility], [address], [place], [place] [address]   Blood Culture (routine x 2)     Status: Abnormal   Collection Time: 05/28/21  7:41 PM   Specimen: BLOOD  Result Value Ref Range Status   Specimen Description   Final    BLOOD BLOOD RIGHT FOREARM Performed at [facility], [address], [place], [place] [address]    Special Requests   Final    BOTTLES DRAWN AEROBIC AND ANAEROBIC Blood Culture results may not be optimal due to an excessive volume of blood received in culture bottles Performed at [facility], [address], [place], [place] [address]    Culture  Setup Time   Final    GRAM POSITIVE COCCI IN CHAINS IN BOTH AEROBIC AND ANAEROBIC BOTTLES CRITICAL VALUE NOTED.  VALUE IS CONSISTENT WITH PREVIOUSLY REPORTED AND CALLED VALUE.    Culture (A)  Final     STREPTOCOCCUS MITIS/ORALIS SUSCEPTIBILITIES PERFORMED ON PREVIOUS CULTURE WITHIN THE LAST 5 DAYS. Performed at [facility], [address], [place], [place] [address]    Report Status 05/31/2021 FINAL  Final  Blood Culture (routine x 2)     Status: Abnormal   Collection Time: 05/28/21  7:46 PM   Specimen: BLOOD LEFT FOREARM  Result Value Ref Range Status   Specimen Description   Final    BLOOD LEFT FOREARM Performed at [facility], [address] [place], [place] [address]    Special Requests   Final    BOTTLES DRAWN AEROBIC AND ANAEROBIC Blood Culture results may not be optimal due to an excessive volume of blood received in culture bottles Performed at [facility], [address] [place], [place] [address]    Culture  Setup Time   Final    GRAM POSITIVE COCCI IN CHAINS IN BOTH AEROBIC AND ANAEROBIC BOTTLES CRITICAL RESULT CALLED TO, READ BACK BY AND VERIFIED WITH: PHARMD CHRISTINE S. [id number] FCP Performed at [facility], [address] [place], [place] [address]    Culture STREPTOCOCCUS MITIS/ORALIS (A)  Final   Report Status 05/31/2021 FINAL  Final   Organism ID, Bacteria STREPTOCOCCUS MITIS/ORALIS  Final      Susceptibility   Streptococcus mitis/oralis - MIC*    TETRACYCLINE 0.5 SENSITIVE Sensitive     VANCOMYCIN 0.5 SENSITIVE Sensitive     CLINDAMYCIN <=0.25 SENSITIVE Sensitive     PENICILLIN Value in next row Sensitive      SENSITIVEMIC =0.06S    * STREPTOCOCCUS MITIS/ORALIS  Blood Culture ID Panel (Reflexed)     Status: Abnormal   Collection Time: 05/28/21  7:46 PM  Result Value Ref Range Status   Enterococcus faecalis NOT DETECTED NOT DETECTED Final   Enterococcus Faecium NOT DETECTED NOT DETECTED Final   Listeria monocytogenes NOT DETECTED NOT DETECTED Final   Staphylococcus species NOT DETECTED NOT DETECTED Final   Staphylococcus aureus (BCID) NOT DETECTED NOT DETECTED Final   Staphylococcus epidermidis NOT DETECTED  NOT DETECTED Final   Staphylococcus lugdunensis NOT DETECTED NOT DETECTED Final   Streptococcus species DETECTED (A) NOT DETECTED  Final    Comment: Not Enterococcus species, Streptococcus agalactiae, Streptococcus pyogenes, or Streptococcus pneumoniae. CRITICAL RESULT CALLED TO, READ BACK BY AND VERIFIED WITH: PHARMD CHRISTINE S. [id number] FCP    Streptococcus agalactiae NOT DETECTED NOT DETECTED Final   Streptococcus pneumoniae NOT DETECTED NOT DETECTED Final   Streptococcus pyogenes NOT DETECTED NOT DETECTED Final   A.calcoaceticus-baumannii NOT DETECTED NOT DETECTED Final   Bacteroides fragilis NOT DETECTED NOT DETECTED Final   Enterobacterales NOT DETECTED NOT DETECTED Final   Enterobacter cloacae complex NOT DETECTED NOT DETECTED Final   Escherichia coli NOT DETECTED NOT DETECTED Final   Klebsiella aerogenes NOT DETECTED NOT DETECTED Final   Klebsiella oxytoca NOT DETECTED NOT DETECTED Final   Klebsiella pneumoniae NOT DETECTED NOT DETECTED Final   Proteus species NOT DETECTED NOT DETECTED Final   Salmonella species NOT DETECTED NOT DETECTED Final   Serratia marcescens NOT DETECTED NOT DETECTED Final   Haemophilus influenzae NOT DETECTED NOT DETECTED Final   Neisseria meningitidis NOT DETECTED NOT DETECTED Final   Pseudomonas aeruginosa NOT DETECTED NOT DETECTED Final   Stenotrophomonas maltophilia NOT DETECTED NOT DETECTED Final   Candida albicans NOT DETECTED NOT DETECTED Final   Candida auris NOT DETECTED NOT DETECTED Final   Candida glabrata NOT DETECTED NOT DETECTED Final   Candida krusei NOT DETECTED NOT DETECTED Final   Candida parapsilosis NOT DETECTED NOT DETECTED Final   Candida tropicalis NOT DETECTED NOT DETECTED Final   Cryptococcus neoformans/gattii NOT DETECTED NOT DETECTED Final    Comment: Performed at [facility], [address], [place], [place] [address]  Urine Culture     Status: None   Collection Time: 05/28/21 10:37 PM   Specimen: In/Out Cath  Urine  Result Value Ref Range Status   Specimen Description   Final    IN/OUT CATH URINE Performed at [facility], [address], [place], [place] [address]    Special Requests   Final    NONE Performed at [facility], [address], [place], [place] [address]    Culture   Final    NO GROWTH Performed at [facility], [address] [place], [place] [address]    Report Status 05/29/2021 FINAL  Final  Respiratory (~20 pathogens) panel by PCR     Status: None   Collection Time: 05/28/21 11:07 PM   Specimen: Nasopharyngeal Swab; Respiratory  Result Value Ref Range Status   Adenovirus NOT DETECTED NOT DETECTED Final   Coronavirus 229E NOT DETECTED NOT DETECTED Final    Comment: (NOTE) The Coronavirus on the Respiratory Panel, DOES NOT test for the novel  Coronavirus (2019 nCoV)    Coronavirus HKU1 NOT DETECTED NOT DETECTED Final   Coronavirus NL63 NOT DETECTED NOT DETECTED Final   Coronavirus OC43 NOT DETECTED NOT DETECTED Final   Metapneumovirus NOT DETECTED NOT DETECTED Final   Rhinovirus / Enterovirus NOT DETECTED NOT DETECTED Final   Influenza A NOT DETECTED NOT DETECTED Final   Influenza B NOT DETECTED NOT DETECTED Final   Parainfluenza Virus 1 NOT DETECTED NOT DETECTED Final   Parainfluenza Virus 2 NOT DETECTED NOT DETECTED Final   Parainfluenza Virus 3 NOT DETECTED NOT DETECTED Final   Parainfluenza Virus 4 NOT DETECTED NOT DETECTED Final   Respiratory Syncytial Virus NOT DETECTED NOT DETECTED Final   Bordetella pertussis NOT DETECTED NOT DETECTED Final   Bordetella Parapertussis NOT DETECTED NOT DETECTED Final   Chlamydophila pneumoniae NOT DETECTED NOT DETECTED Final  Mycoplasma pneumoniae NOT DETECTED NOT DETECTED Final    Comment: Performed at [facility], [address], [place], [place] [address]  MRSA Next Gen by PCR, Nasal     Status: None   Collection Time: 05/29/21  6:38 AM   Specimen: Nasal Mucosa;  Nasal Swab  Result Value Ref Range Status   MRSA by PCR Next Gen NOT DETECTED NOT DETECTED Final    Comment: (NOTE) The GeneXpert MRSA Assay (FDA approved for NASAL specimens only), is one component of a comprehensive MRSA colonization surveillance program. It is not intended to diagnose MRSA infection nor to guide or monitor treatment for MRSA infections. Test performance is not FDA approved in patients less than 54 years old. Performed at [facility], [address], [place], [place] [address]   Culture, blood (routine x 2)     Status: None (Preliminary result)   Collection Time: 06/01/21  5:30 AM   Specimen: BLOOD  Result Value Ref Range Status   Specimen Description   Final    BLOOD RIGHT ANTECUBITAL Performed at [facility], [address] [place], [place] [address]    Special Requests   Final    BOTTLES DRAWN AEROBIC ONLY Blood Culture adequate volume Performed at [facility], [address] [place], [place] [address]    Culture   Final    NO GROWTH 2 DAYS Performed at [facility], [address], [place], [place] [address]    Report Status PENDING  Incomplete    Studies/Results: No results found.   Assessment/Plan: Bioprosthetic Ao IE  TAVR Strep mitis ADR- rash  Total days of antibiotics: 6 Vanco/ceftriaxone, cefepime--> Pen --> Ancef (10-9)  Will watch on ancef Will follow her rash (? If PEN related).  H2 blocker , benadryl --> atarax May need to consider steroids? Explained to pt and son.          Johny Sax MD, FACP Infectious Diseases (pager) [phone] [email or web address] 06/03/2021, 10:57 AM  LOS: 5 days

## 2021-06-04 ENCOUNTER — Encounter: Payer: Self-pay | Admitting: Physician Assistant

## 2021-06-04 ENCOUNTER — Other Ambulatory Visit: Payer: Self-pay | Admitting: Physician Assistant

## 2021-06-04 DIAGNOSIS — G9341 Metabolic encephalopathy: Secondary | ICD-10-CM | POA: Diagnosis not present

## 2021-06-04 DIAGNOSIS — B955 Unspecified streptococcus as the cause of diseases classified elsewhere: Secondary | ICD-10-CM | POA: Diagnosis not present

## 2021-06-04 DIAGNOSIS — R7881 Bacteremia: Secondary | ICD-10-CM | POA: Diagnosis not present

## 2021-06-04 DIAGNOSIS — I330 Acute and subacute infective endocarditis: Secondary | ICD-10-CM | POA: Diagnosis not present

## 2021-06-04 DIAGNOSIS — J9601 Acute respiratory failure with hypoxia: Secondary | ICD-10-CM | POA: Diagnosis not present

## 2021-06-04 DIAGNOSIS — A419 Sepsis, unspecified organism: Secondary | ICD-10-CM | POA: Diagnosis not present

## 2021-06-04 DIAGNOSIS — I358 Other nonrheumatic aortic valve disorders: Secondary | ICD-10-CM | POA: Diagnosis not present

## 2021-06-04 LAB — CBC WITH DIFFERENTIAL/PLATELET
Abs Immature Granulocytes: 0.29 10*3/uL — ABNORMAL HIGH (ref 0.00–0.07)
Basophils Absolute: 0.1 10*3/uL (ref 0.0–0.1)
Basophils Relative: 0 %
Eosinophils Absolute: 0.6 10*3/uL — ABNORMAL HIGH (ref 0.0–0.5)
Eosinophils Relative: 4 %
HCT: 36.8 % (ref 36.0–46.0)
Hemoglobin: 12.1 g/dL (ref 12.0–15.0)
Immature Granulocytes: 2 %
Lymphocytes Relative: 11 %
Lymphs Abs: 1.7 10*3/uL (ref 0.7–4.0)
MCH: 32.7 pg (ref 26.0–34.0)
MCHC: 32.9 g/dL (ref 30.0–36.0)
MCV: 99.5 fL (ref 80.0–100.0)
Monocytes Absolute: 1.1 10*3/uL — ABNORMAL HIGH (ref 0.1–1.0)
Monocytes Relative: 7 %
Neutro Abs: 11.6 10*3/uL — ABNORMAL HIGH (ref 1.7–7.7)
Neutrophils Relative %: 76 %
Platelets: 319 10*3/uL (ref 150–400)
RBC: 3.7 MIL/uL — ABNORMAL LOW (ref 3.87–5.11)
RDW: 12.6 % (ref 11.5–15.5)
WBC: 15.3 10*3/uL — ABNORMAL HIGH (ref 4.0–10.5)
nRBC: 0 % (ref 0.0–0.2)

## 2021-06-04 LAB — BASIC METABOLIC PANEL
Anion gap: 9 (ref 5–15)
BUN: 12 mg/dL (ref 8–23)
CO2: 26 mmol/L (ref 22–32)
Calcium: 8.6 mg/dL — ABNORMAL LOW (ref 8.9–10.3)
Chloride: 102 mmol/L (ref 98–111)
Creatinine, Ser: 0.77 mg/dL (ref 0.44–1.00)
GFR, Estimated: >60 mL/min (ref >60)
Glucose, Bld: 158 mg/dL — ABNORMAL HIGH (ref 70–99)
Potassium: 4.3 mmol/L (ref 3.5–5.1)
Sodium: 137 mmol/L (ref 135–145)

## 2021-06-04 LAB — MAGNESIUM: Magnesium: 2 mg/dL (ref 1.7–2.4)

## 2021-06-04 LAB — C-REACTIVE PROTEIN: CRP: 5 mg/dL — ABNORMAL HIGH (ref <1.0)

## 2021-06-04 MED ORDER — CETIRIZINE HCL 10 MG PO TABS
10.0000 mg | ORAL_TABLET | Freq: Every day | ORAL | Status: DC
  Administered 2021-06-04 – 2021-06-05 (×2): 10 mg via ORAL
  Filled 2021-06-04 (×2): qty 1

## 2021-06-04 MED ORDER — TRIAMCINOLONE ACETONIDE 0.5 % EX CREA
TOPICAL_CREAM | Freq: Two times a day (BID) | CUTANEOUS | Status: DC
  Administered 2021-06-04: 1 via TOPICAL
  Filled 2021-06-04: qty 15

## 2021-06-04 NOTE — Progress Notes (Signed)
[facility]  Date of Admission:  05/28/2021           Reason for visit: Follow up on endocarditis  Current antibiotics: Cefazolin 10/9-present  Previous antibiotics: Azithromycin x 1 dose 10/4 Cefepime 10/4-10/5 Ceftriaxone 10/4-10/6 MTZ 10/4-10/5 Vanco 10/4 x 1 dose PCN G 10/7-10/9    ASSESSMENT:    70 y.o. female admitted with:  Prosthetic valve (TAVR) Strep mitis endocarditis: Confirmed on TEE 05/31/21 with blood cultures positive from 05/28/21 and cleared as of 06/01/21.  Currently on Cefazolin. Rash: Improving.  RECOMMENDATIONS:    Continue cefazolin 2gm q8h for now Monitor rash If blood cx negative tomorrow would be okay to place PICC line Will follow   Principal Problem:   Severe sepsis (HCC) Active Problems:   COPD, severe (HCC)   Pulmonary hypertension, moderate to severe (HCC)   S/P TAVR (transcatheter aortic valve replacement)   Morbid obesity (HCC)   Hypothyroidism   Acute metabolic encephalopathy   Alcohol abuse   Acute respiratory failure with hypoxia (HCC)   Hypomagnesemia   Infective endocarditis of aortic valve   Aortic valve endocarditis   Rash and nonspecific skin eruption    MEDICATIONS:    Scheduled Meds:  aspirin EC  81 mg Oral Daily   cetirizine  10 mg Oral Daily   enoxaparin (LOVENOX) injection  40 mg Subcutaneous Q24H   levothyroxine  112 mcg Oral Q0600   mometasone-formoterol  2 puff Inhalation BID   triamcinolone cream   Topical BID   Continuous Infusions:  sodium chloride Stopped (06/02/21 1532)    ceFAZolin (ANCEF) IV 2 g (06/04/21 1305)   PRN Meds:.sodium chloride, acetaminophen **OR** acetaminophen, albuterol, albuterol, calamine, doxylamine (Sleep), HYDROcodone-acetaminophen, hydrOXYzine, ipratropium-albuterol  SUBJECTIVE:   24 hour events:  No acute events noted  Feeling better today.  Reports rash improved.  No fevers, chills.  Tolerating Ancef. Hoping to go home soon.  Reports  similar rash that developed shortly after her TAVR.   Review of Systems  All other systems reviewed and are negative.    OBJECTIVE:   Blood pressure 140/75, pulse 78, temperature 97.9 F (36.6 C), temperature source Oral, resp. rate (!) 24, height 5' (1.524 m), weight 98.6 kg, SpO2 93 %. Body mass index is 42.45 kg/m.  Physical Exam Constitutional:      General: She is not in acute distress.    Appearance: Normal appearance.  HENT:     Head: Normocephalic and atraumatic.  Eyes:     Extraocular Movements: Extraocular movements intact.     Conjunctiva/sclera: Conjunctivae normal.  Pulmonary:     Effort: Pulmonary effort is normal. No respiratory distress.  Abdominal:     General: There is no distension.     Palpations: Abdomen is soft.     Tenderness: There is no abdominal tenderness.  Skin:    Findings: Rash present.  Neurological:     General: No focal deficit present.     Mental Status: She is alert and oriented to person, place, and time.  Psychiatric:        Mood and Affect: Mood normal.        Behavior: Behavior normal.     Lab Results: Lab Results  Component Value Date   WBC 15.3 (H) 06/04/2021   HGB 12.1 06/04/2021   HCT 36.8 06/04/2021   MCV 99.5 06/04/2021   PLT 319 06/04/2021    Lab Results  Component Value Date   NA 137 06/04/2021  K 4.3 06/04/2021   CO2 26 06/04/2021   GLUCOSE 158 (H) 06/04/2021   BUN 12 06/04/2021   CREATININE 0.77 06/04/2021   CALCIUM 8.6 (L) 06/04/2021   GFRNONAA >60 06/04/2021   GFRAA >90 08/13/2013    Lab Results  Component Value Date   ALT 19 05/29/2021   AST 25 05/29/2021   ALKPHOS 73 05/29/2021   BILITOT 0.4 05/29/2021       Component Value Date/Time   CRP 5.0 (H) 06/04/2021 0420    No results found for: ESRSEDRATE   I have reviewed the micro and lab results in Epic.  Imaging: No results found.   Imaging independently reviewed in Epic.    Vedia Coffer for Infectious  Disease [facility] [phone] pager 06/04/2021, 1:38 PM  I spent greater than 35 minutes with the patient including greater than 50% of time in face to face counsel of the patient and in coordination of their care.

## 2021-06-04 NOTE — Plan of Care (Signed)
  Problem: Clinical Measurements: Goal: Diagnostic test results will improve Outcome: Progressing   Problem: Coping: Goal: Level of anxiety will decrease Outcome: Progressing   Problem: Elimination: Goal: Will not experience complications related to bowel motility Outcome: Progressing Goal: Will not experience complications related to urinary retention Outcome: Progressing

## 2021-06-04 NOTE — TOC Progression Note (Signed)
Transition of Care [facility]) - Progression Note    Patient Details  Name: Cathy Moody MRN: [id number] Date of Birth: Sep 04, 1950  [facility] CM/SW Contact  Kaydynce Pat, Olegario Messier, RN Phone Number: 06/04/2021, 1:35 PM  Clinical Narrative: Noted may need iv abx-awaiting ID recc-[employer] rep Pam following if needed.      Expected Discharge Plan: Home w Home Health Services Barriers to Discharge: Continued Medical Work up  Expected Discharge Plan and Services Expected Discharge Plan: Home w Home Health Services                                               Social Determinants of Health (SDOH) Interventions    Readmission Risk Interventions No flowsheet data found.

## 2021-06-04 NOTE — Plan of Care (Signed)
  Problem: Education: Goal: Knowledge of General Education information will improve Description Including pain rating scale, medication(s)/side effects and non-pharmacologic comfort measures Outcome: Completed/Met   Problem: Clinical Measurements: Goal: Respiratory complications will improve Outcome: Completed/Met   Problem: Activity: Goal: Risk for activity intolerance will decrease Outcome: Completed/Met   

## 2021-06-04 NOTE — Progress Notes (Signed)
Patient refused Lovenox injection. Education provided. Will make on call physician aware.

## 2021-06-04 NOTE — Progress Notes (Signed)
Patient ID: Cathy Moody, female   DOB: September 24, 1950, 70 y.o.   MRN: [id number]  PROGRESS NOTE    Cathy PANCOAST  Moody:[id number] DOB: Nov 28, 1950 DOA: 05/28/2021 PCP: Cathy Nian, MD   Brief Narrative:  70 year old female with history of COPD not on home oxygen, alcohol use, pulmonary hypertension, obesity and TAVR presented with fever.  On presentation, CT of the chest showed no PE or infiltrates.  CT abdomen and pelvis was unremarkable.  UA was unremarkable.  She was started on broad-spectrum antibiotics.  Blood cultures grew Streptococcus mitis/oralis. Echo showed severe aortic stenosis and possible vegetation.  TEE confirmed aortic valve vegetation.  Currently on IV antibiotics as per ID.  Assessment & Plan:   Severe sepsis: Present on admission Streptococcus mitis/oralis bacteremia Aortic valve infective endocarditis in a patient with previous TAVR -Presented with fever, tachypnea, tachycardia and hypotension and acute metabolic encephalopathy possibly from bacteremia and endocarditis -Blood cultures growing Streptococcus mitis/oralis. -TTE showed possible aortic valve vegetation.  Status post TEE by cardiology on 05/31/2021 which showed highly mobile mass on the bioprosthetic aortic valve highly suspicious for vegetation with evidence of severe stenosis of the bioprosthetic valve.  Cardiology is recommending IV antibiotics for endocarditis: Might need follow-up in structural clinic as an outpatient. -Repeat blood cultures from 06/01/2021 are negative so far. -ID following and is recommending 6 weeks of antibiotics: Antibiotics have been switched to penicillin G on 05/31/2021 by ID and subsequently switched to IV Ancef on 06/02/2021 after discussion with ID because of worsening rash.  Ancef currently being continued with close eye on the rash -Currently afebrile.  Leukocytosis -Improving.  WBCs 15.3 today  Acute respiratory failure with hypoxia -Not on oxygen at home.  Required supplemental  oxygen on presentation.  Currently on room air.    Acute metabolic encephalopathy -Possibly from sepsis.   -Mental status has much improved.  Rash -Patient started to have rash when she was already on cefepime.  Has some progression of her rash around her bilateral antecubital regions.  Continue monitoring.   -Discussed with Dr. Kris Hartmann on 06/02/2021 who recommended to switch IV antibiotics to Ancef.  ID is recommending to continue Ancef with close eye on the rash.  Might have to use systemic steroids but patient hesitant to use at this time.  She is okay to try triamcinolone topical.  Continue Atarax and calamine as needed  Hypomagnesemia Hypokalemia -Resolved  Alcohol abuse -Currently on CIWA protocol.  No signs of withdrawal.  DC'd thiamine on 06/03/2021, patient concerned that this is also new medication and might be causing the rash as well  Hypothyroidism -Continue levothyroxine  Pulmonary hypertension -Outpatient follow-up.  Morbid obesity -Outpatient follow-up  COPD -Stable.  Continue Dulera and as needed bronchodilators  Abnormal TSH -Repeat TSH after acute illness   DVT prophylaxis: Lovenox Code Status: Full Family Communication: Mother at bedside on 06/04/2021 Disposition Plan: Status is: Inpatient  Remains inpatient appropriate because:Inpatient level of care appropriate due to severity of illness  Dispo: The patient is from: Home              Anticipated d/c is to: Home              Patient currently is not medically stable to d/c.   Difficult to place patient No  Consultants: Cardiology/ID.  Procedures: 2D echo/TEE  Antimicrobials:  Anti-infectives (From admission, onward)    Start     Dose/Rate Route Frequency Ordered Stop   06/02/21 1300  ceFAZolin (ANCEF) IVPB  2g/100 mL premix        2 g 200 mL/hr over 30 Minutes Intravenous Every 8 hours 06/02/21 1231     05/31/21 1800  penicillin G potassium 12 Million Units in dextrose 5 % 500 mL  continuous infusion  Status:  Discontinued        12 Million Units 41.7 mL/hr over 12 Hours Intravenous Every 12 hours 05/31/21 1417 06/02/21 1204   05/30/21 1400  vancomycin (VANCOCIN) IVPB 1000 mg/200 mL premix  Status:  Discontinued        1,000 mg 200 mL/hr over 60 Minutes Intravenous Every 36 hours 05/29/21 0250 05/29/21 0956   05/29/21 2200  vancomycin (VANCOCIN) IVPB 1000 mg/200 mL premix  Status:  Discontinued        1,000 mg 200 mL/hr over 60 Minutes Intravenous Every 24 hours 05/29/21 0956 05/29/21 1544   05/29/21 1800  cefTRIAXone (ROCEPHIN) 2 g in sodium chloride 0.9 % 100 mL IVPB  Status:  Discontinued        2 g 200 mL/hr over 30 Minutes Intravenous Every 24 hours 05/29/21 1544 05/31/21 1757   05/29/21 1400  ceFEPIme (MAXIPIME) 2 g in sodium chloride 0.9 % 100 mL IVPB  Status:  Discontinued        2 g 200 mL/hr over 30 Minutes Intravenous Every 12 hours 05/29/21 0250 05/29/21 1000   05/29/21 1100  ceFEPIme (MAXIPIME) 2 g in sodium chloride 0.9 % 100 mL IVPB  Status:  Discontinued        2 g 200 mL/hr over 30 Minutes Intravenous Every 12 hours 05/29/21 1000 05/29/21 1544   05/29/21 0115  metroNIDAZOLE (FLAGYL) IVPB 500 mg  Status:  Discontinued        500 mg 100 mL/hr over 60 Minutes Intravenous Every 12 hours 05/29/21 0102 05/30/21 1242   05/29/21 0115  vancomycin (VANCOCIN) IVPB 1000 mg/200 mL premix        1,000 mg 200 mL/hr over 60 Minutes Intravenous  Once 05/29/21 0110 05/29/21 0317   05/29/21 0115  ceFEPIme (MAXIPIME) 2 g in sodium chloride 0.9 % 100 mL IVPB        2 g 200 mL/hr over 30 Minutes Intravenous  Once 05/29/21 0110 05/29/21 0317   05/28/21 2015  cefTRIAXone (ROCEPHIN) 2 g in sodium chloride 0.9 % 100 mL IVPB  Status:  Discontinued        2 g 200 mL/hr over 30 Minutes Intravenous Every 24 hours 05/28/21 2000 05/29/21 0110   05/28/21 2015  azithromycin (ZITHROMAX) 500 mg in sodium chloride 0.9 % 250 mL IVPB  Status:  Discontinued        500 mg 250 mL/hr  over 60 Minutes Intravenous Every 24 hours 05/28/21 2000 05/29/21 0110         Subjective: Patient seen and examined at bedside.  Denies worsening shortness breath, fever, nausea or vomiting.  Still complains of intermittent itching around the rashes.  Wondering when she can go home. Objective: Vitals:   06/03/21 1359 06/03/21 1951 06/03/21 2148 06/04/21 0532  BP: (!) 141/84  128/81 140/75  Pulse: 80  85 78  Resp:   (!) 22 (!) 24  Temp: 97.9 F (36.6 C)  98.7 F (37.1 C) 97.9 F (36.6 C)  TempSrc: Oral  Oral Oral  SpO2: 94% 98% 93% 94%  Weight:      Height:        Intake/Output Summary (Last 24 hours) at 06/04/2021 0738 Last data filed at 06/04/2021 0600  Gross per 24 hour  Intake 1640 ml  Output --  Net 1640 ml    Filed Weights   05/29/21 0123 05/29/21 0443 05/31/21 1200  Weight: 89.8 kg 98.6 kg 98.6 kg    Examination:  General exam: No acute distress.  Currently on room air.  Looks chronically ill and deconditioned. Respiratory system: Bilateral decreased breath sounds at bases with some crackles and intermittent tachypnea cardiovascular system: S1-S2 heard; rate controlled  gastrointestinal system: Abdomen is distended slightly, morbidly obese, soft and nontender.  Bowel sounds are heard extremities: No cyanosis; trace lower extremity edema present Central nervous system: Alert and oriented, still slow to respond.  No focal neurological deficits.  Moving extremities  skin: Erythematous maculopapular rash on upper and lower back and around bilateral antecubital regions and lower arms and on abdominal wall as well.  No petechiae or other lesions  psychiatry: Affect is flat  Data Reviewed: I have personally reviewed following labs and imaging studies  CBC: Recent Labs  Lab 05/28/21 1941 05/29/21 0639 05/30/21 0457 05/31/21 0445 06/02/21 0459 06/04/21 0420  WBC 33.6* 34.0* 31.4* 24.6* 15.9* 15.3*  NEUTROABS 29.9* 32.0*  --   --  12.7* 11.6*  HGB 13.3 11.8*  11.8* 11.7* 11.5* 12.1  HCT 39.8 34.5* 35.2* 34.8* 35.5* 36.8  MCV 98.0 99.1 100.0 99.4 101.1* 99.5  PLT 288 269 265 261 241 319    Basic Metabolic Panel: Recent Labs  Lab 05/28/21 1941 05/29/21 0639 05/30/21 0457 05/31/21 0445 06/02/21 0459 06/03/21 0453 06/04/21 0420  NA 136 142 144 138 138 136 137  K 3.4* 4.2 4.3 3.8 4.3 4.4 4.3  CL 100 106 113* 105 103 102 102  CO2 GLUCOSE 170* 268* 216* 163* 160* 152* 158*  BUN CREATININE 1.29* 0.92 0.89 0.83 0.72 0.74 0.77  CALCIUM 8.7* 8.7* 9.1 8.1* 8.2* 8.4* 8.6*  MG 1.6* 2.5* 2.1  --  1.9  --  2.0  PHOS  --  4.0  --   --   --   --   --     GFR: Estimated Creatinine Clearance: 69.9 mL/min (by C-G formula based on SCr of 0.77 mg/dL). Liver Function Tests: Recent Labs  Lab 05/28/21 1941 05/29/21 0639  AST 26 25  ALT 19 19  ALKPHOS 95 73  BILITOT 1.4* 0.4  PROT 7.3 6.8  ALBUMIN 3.8 3.1*    No results for input(s): LIPASE, AMYLASE in the last 168 hours. Recent Labs  Lab 05/28/21 2330  AMMONIA 32    Coagulation Profile: Recent Labs  Lab 05/28/21 1941  INR 1.1    Cardiac Enzymes: Recent Labs  Lab 05/28/21 1941  CKTOTAL 62    BNP (last 3 results) No results for input(s): PROBNP in the last 8760 hours. HbA1C: No results for input(s): HGBA1C in the last 72 hours.  CBG: Recent Labs  Lab 05/29/21 1855 05/29/21 2101 05/30/21 0735 05/30/21 1208  GLUCAP 214* 299* 188* 199*    Lipid Profile: No results for input(s): CHOL, HDL, LDLCALC, TRIG, CHOLHDL, LDLDIRECT in the last 72 hours. Thyroid Function Tests: No results for input(s): TSH, T4TOTAL, FREET4, T3FREE, THYROIDAB in the last 72 hours.  Anemia Panel: No results for input(s): VITAMINB12, FOLATE, FERRITIN, TIBC, IRON, RETICCTPCT in the last 72 hours. Sepsis Labs: Recent Labs  Lab 05/28/21 1941 05/29/21 0000  LATICACIDVEN 1.6 1.5     Recent Results (from the past 240 hour(s))  Resp Panel by RT-PCR  (Flu A&B, Covid) Nasopharyngeal Swab     Status: None   Collection Time: 05/28/21  7:41 PM   Specimen: Nasopharyngeal Swab; Nasopharyngeal(NP) swabs in vial transport medium  Result Value Ref Range Status   SARS Coronavirus 2 by RT PCR NEGATIVE NEGATIVE Final    Comment: (NOTE) SARS-CoV-2 target nucleic acids are NOT DETECTED.  The SARS-CoV-2 RNA is generally detectable in upper respiratory specimens during the acute phase of infection. The lowest concentration of SARS-CoV-2 viral copies this assay can detect is 138 copies/mL. A negative result does not preclude SARS-Cov-2 infection and should not be used as the sole basis for treatment or other patient management decisions. A negative result may occur with  improper specimen collection/handling, submission of specimen other than nasopharyngeal swab, presence of viral mutation(s) within the areas targeted by this assay, and inadequate number of viral copies(<138 copies/mL). A negative result must be combined with clinical observations, patient history, and epidemiological information. The expected result is Negative.  Fact Sheet for Patients:  BloggerCourse.com  Fact Sheet for Healthcare Providers:  SeriousBroker.it  This test is no t yet approved or cleared by the [place] FDA and  has been authorized for detection and/or diagnosis of SARS-CoV-2 by FDA under an Emergency Use Authorization (EUA). This EUA will remain  in effect (meaning this test can be used) for the duration of the COVID-19 declaration under Section 564(b)(1) of the Act, 21 U.S.C.section 360bbb-3(b)(1), unless the authorization is terminated  or revoked sooner.       Influenza A by PCR NEGATIVE NEGATIVE Final   Influenza B by PCR NEGATIVE NEGATIVE Final    Comment: (NOTE) The Xpert Xpress SARS-CoV-2/FLU/RSV plus assay is intended as an aid in the diagnosis of influenza from Nasopharyngeal swab specimens  and should not be used as a sole basis for treatment. Nasal washings and aspirates are unacceptable for Xpert Xpress SARS-CoV-2/FLU/RSV testing.  Fact Sheet for Patients: BloggerCourse.com  Fact Sheet for Healthcare Providers: SeriousBroker.it  This test is not yet approved or cleared by the [place] FDA and has been authorized for detection and/or diagnosis of SARS-CoV-2 by FDA under an Emergency Use Authorization (EUA). This EUA will remain in effect (meaning this test can be used) for the duration of the COVID-19 declaration under Section 564(b)(1) of the Act, 21 U.S.C. section 360bbb-3(b)(1), unless the authorization is terminated or revoked.  Performed at [facility], [address], [place], [place] [address]   Blood Culture (routine x 2)     Status: Abnormal   Collection Time: 05/28/21  7:41 PM   Specimen: BLOOD  Result Value Ref Range Status   Specimen Description   Final    BLOOD BLOOD RIGHT FOREARM Performed at [facility], [address] [place], [place] [address]    Special Requests   Final    BOTTLES DRAWN AEROBIC AND ANAEROBIC Blood Culture results may not be optimal due to an excessive volume of blood received in culture bottles Performed at [facility], [address], [place], [place] [address]    Culture  Setup Time   Final    GRAM POSITIVE COCCI IN CHAINS IN BOTH AEROBIC AND ANAEROBIC BOTTLES CRITICAL VALUE NOTED.  VALUE IS CONSISTENT WITH PREVIOUSLY REPORTED AND CALLED VALUE.    Culture (A)  Final    STREPTOCOCCUS MITIS/ORALIS SUSCEPTIBILITIES PERFORMED ON PREVIOUS CULTURE WITHIN THE LAST 5 DAYS. Performed at [facility], [address] [place],  [place] [address]    Report Status 05/31/2021 FINAL  Final  Blood Culture (routine x 2)     Status: Abnormal   Collection Time: 05/28/21  7:46 PM   Specimen: BLOOD LEFT FOREARM  Result  Value Ref Range Status   Specimen Description   Final    BLOOD LEFT FOREARM Performed at [facility], [address] [place], [place] [address]    Special Requests   Final    BOTTLES DRAWN AEROBIC AND ANAEROBIC Blood Culture results may not be optimal due to an excessive volume of blood received in culture bottles Performed at [facility], [address], [place], [place] [address]    Culture  Setup Time   Final    GRAM POSITIVE COCCI IN CHAINS IN BOTH AEROBIC AND ANAEROBIC BOTTLES CRITICAL RESULT CALLED TO, READ BACK BY AND VERIFIED WITH: PHARMD CHRISTINE S. [phone] FCP Performed at [facility], [address], [place], [place] [address]    Culture STREPTOCOCCUS MITIS/ORALIS (A)  Final   Report Status 05/31/2021 FINAL  Final   Organism ID, Bacteria STREPTOCOCCUS MITIS/ORALIS  Final      Susceptibility   Streptococcus mitis/oralis - MIC*    TETRACYCLINE 0.5 SENSITIVE Sensitive     VANCOMYCIN 0.5 SENSITIVE Sensitive     CLINDAMYCIN <=0.25 SENSITIVE Sensitive     PENICILLIN Value in next row Sensitive      SENSITIVEMIC =0.06S    * STREPTOCOCCUS MITIS/ORALIS  Blood Culture ID Panel (Reflexed)     Status: Abnormal   Collection Time: 05/28/21  7:46 PM  Result Value Ref Range Status   Enterococcus faecalis NOT DETECTED NOT DETECTED Final   Enterococcus Faecium NOT DETECTED NOT DETECTED Final   Listeria monocytogenes NOT DETECTED NOT DETECTED Final   Staphylococcus species NOT DETECTED NOT DETECTED Final   Staphylococcus aureus (BCID) NOT DETECTED NOT DETECTED Final   Staphylococcus epidermidis NOT DETECTED NOT DETECTED Final   Staphylococcus lugdunensis NOT DETECTED NOT DETECTED Final   Streptococcus species DETECTED (A) NOT DETECTED Final    Comment: Not Enterococcus species, Streptococcus agalactiae, Streptococcus pyogenes, or Streptococcus pneumoniae. CRITICAL RESULT CALLED TO, READ BACK BY AND VERIFIED WITH: PHARMD CHRISTINE S. [phone]  FCP    Streptococcus agalactiae NOT DETECTED NOT DETECTED Final   Streptococcus pneumoniae NOT DETECTED NOT DETECTED Final   Streptococcus pyogenes NOT DETECTED NOT DETECTED Final   A.calcoaceticus-baumannii NOT DETECTED NOT DETECTED Final   Bacteroides fragilis NOT DETECTED NOT DETECTED Final   Enterobacterales NOT DETECTED NOT DETECTED Final   Enterobacter cloacae complex NOT DETECTED NOT DETECTED Final   Escherichia coli NOT DETECTED NOT DETECTED Final   Klebsiella aerogenes NOT DETECTED NOT DETECTED Final   Klebsiella oxytoca NOT DETECTED NOT DETECTED Final   Klebsiella pneumoniae NOT DETECTED NOT DETECTED Final   Proteus species NOT DETECTED NOT DETECTED Final   Salmonella species NOT DETECTED NOT DETECTED Final   Serratia marcescens NOT DETECTED NOT DETECTED Final   Haemophilus influenzae NOT DETECTED NOT DETECTED Final   Neisseria meningitidis NOT DETECTED NOT DETECTED Final   Pseudomonas aeruginosa NOT DETECTED NOT DETECTED Final   Stenotrophomonas maltophilia NOT DETECTED NOT DETECTED Final   Candida albicans NOT DETECTED NOT DETECTED Final   Candida auris NOT DETECTED NOT DETECTED Final   Candida glabrata NOT DETECTED NOT DETECTED Final   Candida krusei NOT DETECTED NOT DETECTED Final   Candida parapsilosis NOT DETECTED NOT DETECTED Final   Candida tropicalis NOT DETECTED NOT DETECTED Final  Cryptococcus neoformans/gattii NOT DETECTED NOT DETECTED Final    Comment: Performed at [facility], [address], [place], [place] [address]  Urine Culture     Status: None   Collection Time: 05/28/21 10:37 PM   Specimen: In/Out Cath Urine  Result Value Ref Range Status   Specimen Description   Final    IN/OUT CATH URINE Performed at [facility], [address], [place], [place] [address]    Special Requests   Final    NONE Performed at [facility], [address] [place], [place] [address]    Culture   Final    NO  GROWTH Performed at [facility], [address] [place], [place] [address]    Report Status 05/29/2021 FINAL  Final  Respiratory (~20 pathogens) panel by PCR     Status: None   Collection Time: 05/28/21 11:07 PM   Specimen: Nasopharyngeal Swab; Respiratory  Result Value Ref Range Status   Adenovirus NOT DETECTED NOT DETECTED Final   Coronavirus 229E NOT DETECTED NOT DETECTED Final    Comment: (NOTE) The Coronavirus on the Respiratory Panel, DOES NOT test for the novel  Coronavirus (2019 nCoV)    Coronavirus HKU1 NOT DETECTED NOT DETECTED Final   Coronavirus NL63 NOT DETECTED NOT DETECTED Final   Coronavirus OC43 NOT DETECTED NOT DETECTED Final   Metapneumovirus NOT DETECTED NOT DETECTED Final   Rhinovirus / Enterovirus NOT DETECTED NOT DETECTED Final   Influenza A NOT DETECTED NOT DETECTED Final   Influenza B NOT DETECTED NOT DETECTED Final   Parainfluenza Virus 1 NOT DETECTED NOT DETECTED Final   Parainfluenza Virus 2 NOT DETECTED NOT DETECTED Final   Parainfluenza Virus 3 NOT DETECTED NOT DETECTED Final   Parainfluenza Virus 4 NOT DETECTED NOT DETECTED Final   Respiratory Syncytial Virus NOT DETECTED NOT DETECTED Final   Bordetella pertussis NOT DETECTED NOT DETECTED Final   Bordetella Parapertussis NOT DETECTED NOT DETECTED Final   Chlamydophila pneumoniae NOT DETECTED NOT DETECTED Final   Mycoplasma pneumoniae NOT DETECTED NOT DETECTED Final    Comment: Performed at [facility], [address], [place], [place] [address]  MRSA Next Gen by PCR, Nasal     Status: None   Collection Time: 05/29/21  6:38 AM   Specimen: Nasal Mucosa; Nasal Swab  Result Value Ref Range Status   MRSA by PCR Next Gen NOT DETECTED NOT DETECTED Final    Comment: (NOTE) The GeneXpert MRSA Assay (FDA approved for NASAL specimens only), is one component of a comprehensive MRSA colonization surveillance program. It is not intended to diagnose MRSA infection nor to guide or monitor  treatment for MRSA infections. Test performance is not FDA approved in patients less than 72 years old. Performed at [facility], [address] [place], [place] [address]   Culture, blood (routine x 2)     Status: None (Preliminary result)   Collection Time: 06/01/21  5:30 AM   Specimen: BLOOD  Result Value Ref Range Status   Specimen Description   Final    BLOOD RIGHT ANTECUBITAL Performed at [facility], [address], [place], [place] [address]    Special Requests   Final    BOTTLES DRAWN AEROBIC ONLY Blood Culture adequate volume Performed at [facility], [address] [place], [place] [address]    Culture   Final    NO GROWTH 2 DAYS Performed at [facility], [address], [place], [place] [address]    Report Status PENDING  Incomplete          Radiology Studies: No results found.      Scheduled Meds:  aspirin EC  81 mg Oral Daily   enoxaparin (LOVENOX) injection  40 mg Subcutaneous Q24H   levothyroxine  112 mcg Oral Q0600   mometasone-formoterol  2 puff Inhalation BID   Continuous Infusions:  sodium chloride Stopped (06/02/21 1532)    ceFAZolin (ANCEF) IV 2 g (06/04/21 0431)          Glade Lloyd, MD [facility] 06/04/2021, 7:38 AM

## 2021-06-05 ENCOUNTER — Inpatient Hospital Stay: Payer: Self-pay

## 2021-06-05 DIAGNOSIS — R7881 Bacteremia: Secondary | ICD-10-CM | POA: Diagnosis not present

## 2021-06-05 DIAGNOSIS — I358 Other nonrheumatic aortic valve disorders: Secondary | ICD-10-CM | POA: Diagnosis not present

## 2021-06-05 DIAGNOSIS — Z952 Presence of prosthetic heart valve: Secondary | ICD-10-CM | POA: Diagnosis not present

## 2021-06-05 DIAGNOSIS — B955 Unspecified streptococcus as the cause of diseases classified elsewhere: Secondary | ICD-10-CM | POA: Diagnosis not present

## 2021-06-05 LAB — BASIC METABOLIC PANEL
Anion gap: 5 (ref 5–15)
BUN: 12 mg/dL (ref 8–23)
CO2: 29 mmol/L (ref 22–32)
Calcium: 8.7 mg/dL — ABNORMAL LOW (ref 8.9–10.3)
Chloride: 105 mmol/L (ref 98–111)
Creatinine, Ser: 0.7 mg/dL (ref 0.44–1.00)
GFR, Estimated: >60 mL/min (ref >60)
Glucose, Bld: 165 mg/dL — ABNORMAL HIGH (ref 70–99)
Potassium: 4.3 mmol/L (ref 3.5–5.1)
Sodium: 139 mmol/L (ref 135–145)

## 2021-06-05 LAB — CBC WITH DIFFERENTIAL/PLATELET
Abs Immature Granulocytes: 0.24 10*3/uL — ABNORMAL HIGH (ref 0.00–0.07)
Basophils Absolute: 0 10*3/uL (ref 0.0–0.1)
Basophils Relative: 0 %
Eosinophils Absolute: 0.5 10*3/uL (ref 0.0–0.5)
Eosinophils Relative: 4 %
HCT: 36 % (ref 36.0–46.0)
Hemoglobin: 11.8 g/dL — ABNORMAL LOW (ref 12.0–15.0)
Immature Granulocytes: 2 %
Lymphocytes Relative: 12 %
Lymphs Abs: 1.4 10*3/uL (ref 0.7–4.0)
MCH: 33.1 pg (ref 26.0–34.0)
MCHC: 32.8 g/dL (ref 30.0–36.0)
MCV: 101.1 fL — ABNORMAL HIGH (ref 80.0–100.0)
Monocytes Absolute: 1.1 10*3/uL — ABNORMAL HIGH (ref 0.1–1.0)
Monocytes Relative: 9 %
Neutro Abs: 8.8 10*3/uL — ABNORMAL HIGH (ref 1.7–7.7)
Neutrophils Relative %: 73 %
Platelets: 300 10*3/uL (ref 150–400)
RBC: 3.56 MIL/uL — ABNORMAL LOW (ref 3.87–5.11)
RDW: 12.6 % (ref 11.5–15.5)
WBC: 12 10*3/uL — ABNORMAL HIGH (ref 4.0–10.5)
nRBC: 0 % (ref 0.0–0.2)

## 2021-06-05 LAB — C-REACTIVE PROTEIN: CRP: 4.2 mg/dL — ABNORMAL HIGH (ref <1.0)

## 2021-06-05 LAB — MAGNESIUM: Magnesium: 2 mg/dL (ref 1.7–2.4)

## 2021-06-05 MED ORDER — LEVOTHYROXINE SODIUM 112 MCG PO TABS: 112.0000 ug | ORAL_TABLET | Freq: Every day | ORAL | 0 refills | Status: DC

## 2021-06-05 MED ORDER — CHLORHEXIDINE GLUCONATE CLOTH 2 % EX PADS: 6.0000 | MEDICATED_PAD | Freq: Every day | CUTANEOUS | Status: DC

## 2021-06-05 MED ORDER — CETIRIZINE HCL 10 MG PO TABS: 10.0000 mg | ORAL_TABLET | Freq: Every day | ORAL | 0 refills | Status: DC

## 2021-06-05 MED ORDER — SODIUM CHLORIDE 0.9 % IV SOLN
2.0000 g | Freq: Two times a day (BID) | INTRAVENOUS | Status: DC
  Filled 2021-06-05: qty 20

## 2021-06-05 MED ORDER — CEFTRIAXONE IV (FOR PTA / DISCHARGE USE ONLY): 2.0000 g | Freq: Two times a day (BID) | INTRAVENOUS | 0 refills | Status: DC

## 2021-06-05 MED ORDER — SODIUM CHLORIDE 0.9% FLUSH: 10.0000 mL | INTRAVENOUS | Status: DC | PRN

## 2021-06-05 MED ORDER — TRIAMCINOLONE ACETONIDE 0.5 % EX CREA: TOPICAL_CREAM | Freq: Two times a day (BID) | CUTANEOUS | 2 refills | Status: DC

## 2021-06-05 MED ORDER — SODIUM CHLORIDE 0.9 % IV SOLN
2.0000 g | INTRAVENOUS | Status: DC
  Administered 2021-06-05: 2 g via INTRAVENOUS
  Filled 2021-06-05: qty 20

## 2021-06-05 MED ORDER — CALAMINE EX LOTN: TOPICAL_LOTION | Freq: Two times a day (BID) | CUTANEOUS | 0 refills | Status: DC | PRN

## 2021-06-05 MED ORDER — ALBUTEROL SULFATE HFA 108 (90 BASE) MCG/ACT IN AERS: 2.0000 | INHALATION_SPRAY | Freq: Four times a day (QID) | RESPIRATORY_TRACT | 0 refills | Status: DC | PRN

## 2021-06-05 MED ORDER — CEFTRIAXONE IV (FOR PTA / DISCHARGE USE ONLY): 2.0000 g | INTRAVENOUS | 0 refills | Status: DC

## 2021-06-05 MED ORDER — HYDROXYZINE HCL 25 MG PO TABS: 25.0000 mg | ORAL_TABLET | Freq: Four times a day (QID) | ORAL | 0 refills | Status: DC | PRN

## 2021-06-05 NOTE — Progress Notes (Signed)
Peripherally Inserted Central Catheter Placement  The IV Nurse has discussed with the patient and/or persons authorized to consent for the patient, the purpose of this procedure and the potential benefits and risks involved with this procedure.  The benefits include less needle sticks, lab draws from the catheter, and the patient may be discharged home with the catheter. Risks include, but not limited to, infection, bleeding, blood clot (thrombus formation), and puncture of an artery; nerve damage and irregular heartbeat and possibility to perform a PICC exchange if needed/ordered by physician.  Alternatives to this procedure were also discussed.  Bard Power PICC patient education guide, fact sheet on infection prevention and patient information card has been provided to patient /or left at bedside.    PICC Placement Documentation  PICC Single Lumen 06/05/21 Right Basilic 39 cm 0 cm (Active)  Indication for Insertion or Continuance of Line Home intravenous therapies (PICC only) 06/05/21 1225  Exposed Catheter (cm) 0 cm 06/05/21 1225  Site Assessment Clean;Dry;Intact 06/05/21 1225  Line Status Flushed;Saline locked;Blood return noted 06/05/21 1225  Dressing Type Transparent 06/05/21 1225  Dressing Status Clean;Dry;Intact 06/05/21 1225  Antimicrobial disc in place? Yes 06/05/21 1225  Dressing Intervention New dressing;Other (Comment) 06/05/21 1225  Dressing Change Due 06/12/21 06/05/21 1225       Reginia Forts Albarece 06/05/2021, 12:27 PM

## 2021-06-05 NOTE — Progress Notes (Addendum)
PHARMACY CONSULT NOTE FOR:  OUTPATIENT  PARENTERAL ANTIBIOTIC THERAPY (OPAT)  Indication: Strep mitis/oralis endocarditis  Regimen: Ceftriaxone 2g IV Q24H  End date: 07/13/2021 (6 weeks from last negative blood culture)  IV antibiotic discharge orders are pended. To discharging provider:  please sign these orders via discharge navigator,  Select New Orders & click on the button choice - Manage This Unsigned Work.     Thank you for allowing pharmacy to be a part of this patient's care.  Jani Gravel, PharmD PGY-1 Acute Care Resident  06/05/2021 9:42 AM

## 2021-06-05 NOTE — Discharge Summary (Signed)
Physician Discharge Summary  Cathy Moody WNU:[id number] DOB: 01/02/1951 DOA: 05/28/2021  PCP: Denita Lung, MD  Admit date: 05/28/2021 Discharge date: 06/05/2021  Admitted From: Home Disposition: Home  Recommendations for Outpatient Follow-up:  Follow up with PCP in 1 week with repeat CBC/BMP Outpatient follow-up with cardiology and ID Follow up in ED if symptoms worsen or new appear   Home Health: Home health RN  equipment/Devices: PICC line to be placed today on 06/05/2021  Discharge Condition: Stable CODE STATUS: Full Diet recommendation: Heart healthy  Brief/Interim Summary: 70 year old female with history of COPD not on home oxygen, alcohol use, pulmonary hypertension, obesity and TAVR presented with fever.  On presentation, CT of the chest showed no PE or infiltrates.  CT abdomen and pelvis was unremarkable.  UA was unremarkable.  She was started on broad-spectrum antibiotics.  Blood cultures grew Streptococcus mitis/oralis. Echo showed severe aortic stenosis and possible vegetation.  TEE confirmed aortic valve vegetation.  Currently on IV antibiotics as per ID.  Repeat blood cultures have been negative so far.  ID has recommended IV Rocephin 2 g IV every 12 till 07/13/2021 and has cleared the patient for discharge.  PICC line to be placed today.  She will be discharged home today afterwards with outpatient follow-up with PCP/ID/cardiology.  Discharge Diagnoses:   Severe sepsis: Present on admission; resolved Streptococcus mitis/oralis bacteremia Aortic valve infective endocarditis in a patient with previous TAVR -Presented with fever, tachypnea, tachycardia and hypotension and acute metabolic encephalopathy possibly from bacteremia and endocarditis -Blood cultures growing Streptococcus mitis/oralis. -TTE showed possible aortic valve vegetation.  Status post TEE by cardiology on 05/31/2021 which showed highly mobile mass on the bioprosthetic aortic valve highly suspicious for  vegetation with evidence of severe stenosis of the bioprosthetic valve.  Cardiology is recommending IV antibiotics for endocarditis: Might need follow-up in structural clinic as an outpatient. -Repeat blood cultures from 06/01/2021 are negative so far. -ID following and is recommending 6 weeks of antibiotics: Antibiotics have been switched to penicillin G on 05/31/2021 by ID and subsequently switched to IV Ancef on 06/02/2021 after discussion with ID because of worsening rash.  Ancef currently being continued with close eye on the rash -Currently afebrile and hemodynamically stable. -D has recommended IV Rocephin 2 g IV every 12 till 07/13/2021 and has cleared the patient for discharge.  PICC line to be placed today.  She will be discharged home today afterwards with outpatient follow-up with PCP/ID/cardiology.   Leukocytosis -Improving.  WBCs 12 today   Acute respiratory failure with hypoxia -Not on oxygen at home.  Required supplemental oxygen on presentation.  Currently on room air.     Acute metabolic encephalopathy -Possibly from sepsis.   -Mental status has much improved.   Rash -Patient started to have rash when she was already on cefepime.  Has some progression of her rash around her bilateral antecubital regions.  Continue monitoring.   -Discussed with Dr. Luvenia Heller on 06/02/2021 who recommended to switch IV antibiotics to Ancef.  ID recommended to continue Ancef with close eye on the rash.  Might have to use systemic steroids but patient hesitant to use at this time.  She is okay to try triamcinolone topical.  Continue Atarax and calamine as needed -Discharged home on topical triamcinolone and as needed calamine/Atarax.  Continue cetirizine for itching.   Hypomagnesemia Hypokalemia -Resolved   Alcohol abuse -Currently on CIWA protocol.  No signs of withdrawal.  DC'd thiamine on 06/03/2021, patient concerned that this is also new  medication and might be causing the rash as well    Hypothyroidism -Continue levothyroxine   Pulmonary hypertension -Outpatient follow-up.   Morbid obesity -Outpatient follow-up   COPD -Stable.  Continue home regimen.  Outpatient follow-up.  Abnormal TSH -Repeat TSH after acute illness  Discharge Instructions  Discharge Instructions     [facility] pharmacist to adjust dose for Vancomycin, Aminoglycosides and other anti-infective therapies as requested by physician.   Complete by: As directed    [facility] to provide Cath Flo 414m   Complete by: As directed    Administer for PICC line occlusion and as ordered by physician for other access device issues.   Ambulatory referral to Infectious Disease   Complete by: As directed    Anaphylaxis Kit: Provided to treat any anaphylactic reaction to the medication being provided to the patient if First Dose or when requested by physician   Complete by: As directed    Epinephrine 158mml vial / amp: Administer 0.14m68m0.14ml35mubcutaneously once for moderate to severe anaphylaxis, nurse to call physician and pharmacy when reaction occurs and call 911 if needed for immediate care   Diphenhydramine 50mg68mIV vial: Administer 25-50mg 107mM PRN for first dose reaction, rash, itching, mild reaction, nurse to call physician and pharmacy when reaction occurs   Sodium Chloride 0.9% NS 500ml I49mdminister if needed for hypovolemic blood pressure drop or as ordered by physician after call to physician with anaphylactic reaction   Change dressing on IV access line weekly and PRN   Complete by: As directed    Diet - low sodium heart healthy   Complete by: As directed    Flush IV access with Sodium Chloride 0.9% and Heparin 10 units/ml or 100 units/ml   Complete by: As directed    Home infusion instructions - [facility]   Complete by: As directed    Instructions: Flush IV access with Sodium Chloride 0.9% and Heparin 10units/ml or 100units/ml   Change dressing on IV  access line: Weekly and PRN   Instructions Cath Flo 2mg: Ad29mister for PICC Line occlusion and as ordered by physician for other access device   [facility] pharmacist to adjust dose for: Vancomycin, Aminoglycosides and other anti-infective therapies as requested by physician   Increase activity slowly   Complete by: As directed    Method of administration may be changed at the discretion of home infusion pharmacist based upon assessment of the patient and/or caregiver's ability to self-administer the medication ordered   Complete by: As directed    No wound care   Complete by: As directed    Outpatient Parenteral Antibiotic Therapy Information Antibiotic: Ceftriaxone (Rocephin) IVPB; Indications for use: Endocarditis; End Date: 07/13/2021   Complete by: As directed    Antibiotic: Ceftriaxone (Rocephin) IVPB   Indications for use: Endocarditis   End Date: 07/13/2021      Allergies as of 06/05/2021       Reactions   Penicillin G Rash   Plavix [clopidogrel Bisulfate] Rash        Medication List     STOP taking these medications    ALPRAZolam 0.25 MG tablet Commonly known as: XANAX   furosemide 40 MG tablet Commonly known as: LASIX   potassium chloride SA 20 MEQ tablet Commonly known as: KLOR-CON       TAKE these medications    albuterol 108 (90 Base) MCG/ACT inhaler Commonly known as: VENTOLIN HFA Inhale 2 puffs into the lungs every 6 (  six) hours as needed for wheezing or shortness of breath.   aspirin 81 MG EC tablet Take 1 tablet (81 mg total) by mouth daily.   Breo Ellipta 100-25 MCG/INH Aepb Generic drug: fluticasone furoate-vilanterol TAKE 1 PUFF BY MOUTH EVERY DAY   calamine lotion Apply topically 2 (two) times daily as needed for itching (twice daily prn).   cefTRIAXone  IVPB Commonly known as: ROCEPHIN Inject 2 g into the vein daily. Indication:  Endocarditis  First Dose: No Last Day of Therapy:  07/13/2021 Labs - Once weekly:  CBC/D  and BMP, Labs - Every other week:  ESR and CRP Method of administration: IV Push Method of administration may be changed at the discretion of home infusion pharmacist based upon assessment of the patient and/or caregiver's ability to self-administer the medication ordered.   cetirizine 10 MG tablet Commonly known as: ZYRTEC Take 1 tablet (10 mg total) by mouth daily.   hydrOXYzine 25 MG tablet Commonly known as: ATARAX/VISTARIL Take 1 tablet (25 mg total) by mouth every 6 (six) hours as needed for itching.   levothyroxine 112 MCG tablet Commonly known as: SYNTHROID Take 1 tablet (112 mcg total) by mouth daily before breakfast. What changed: when to take this   triamcinolone cream 0.5 % Commonly known as: KENALOG Apply topically 2 (two) times daily. Apply over affected area twice daily               Discharge Care Instructions  (From admission, onward)           Start     Ordered   06/05/21 0000  Change dressing on IV access line weekly and PRN  (Home infusion instructions - [facility] )        06/05/21 0959            Follow-up Information     Eileen Stanford, PA-C. Go on 06/13/2021.   Specialties: Cardiology, Radiology Why: @ 1:30pm, please arrive at least 10 minutes early. Contact information: [address] [place] [address] [phone]         Denita Lung, MD. Schedule an appointment as soon as possible for a visit in 1 week(s).   Specialty: Family Medicine Why: with repeat cbc/bmp Contact information: [place] [address] [phone]         Fay Records, MD .   Specialty: Cardiology Contact information: [address] [place] [address] [phone]                Allergies  Allergen Reactions   Penicillin G Rash   Plavix [Clopidogrel Bisulfate] Rash    Consultations: Cardiology/ID.   Procedures/Studies: CT Angio Chest PE W and/or  Wo Contrast  Result Date: 05/28/2021 CLINICAL DATA:  Fever and shortness of breath. EXAM: CT ANGIOGRAPHY CHEST WITH CONTRAST TECHNIQUE: Multidetector CT imaging of the chest was performed using the standard protocol during bolus administration of intravenous contrast. Multiplanar CT image reconstructions and MIPs were obtained to evaluate the vascular anatomy. CONTRAST:  31m OMNIPAQUE IOHEXOL 350 MG/ML SOLN COMPARISON:  Chest x-ray from same day. CTA chest dated August 05, 2013. FINDINGS: Cardiovascular: Satisfactory opacification of the pulmonary arteries to the segmental level. No evidence of pulmonary embolism. Chronic cardiomegaly status post TAVR. 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 No pericardial effusion. Motion artifact somewhat limits evaluation but the ascending thoracic aortic aneurysm is grossly  unchanged, currently measuring up to 4.3 cm. Coronary, aortic arch, and branch vessel atherosclerotic vascular disease. Mediastinum/Nodes: No enlarged mediastinal, hilar, or axillary lymph nodes. Thyroid gland, trachea, and esophagus demonstrate no significant findings. Lungs/Pleura: Moderate centrilobular emphysema. Mild subsegmental atelectasis at the lung bases No focal consolidation, pleural effusion, or pneumothorax. Upper Abdomen: No acute abnormality. Musculoskeletal: No chest wall abnormality. No acute or significant osseous findings. Review of the MIP images confirms the above findings. IMPRESSION: 1. No evidence of pulmonary embolism. No acute intrathoracic process. 2. Unchanged 4.3 cm ascending thoracic aortic aneurysm. Recommend annual imaging followup by CTA or MRA. This recommendation follows 2010 ACCF/AHA/AATS/ACR/ASA/SCA/SCAI/SIR/STS/SVM Guidelines for the Diagnosis and Management of Patients with Thoracic Aortic Disease. Circulation. 2010; 121: D983-J825. Aortic aneurysm NOS (ICD10-I71.9) 3. Small 1.5 cm left  ventricular pseudoaneurysm at the apex, presumably related to prior infarct. 4. Aortic Atherosclerosis (ICD10-I70.0) and Emphysema (ICD10-J43.9). Electronically Signed   By: Titus Dubin M.D.   On: 05/28/2021 22:08   DG Chest Port 1 View  Result Date: 05/28/2021 CLINICAL DATA:  Questionable sepsis. EXAM: PORTABLE CHEST 1 VIEW COMPARISON:  Chest x-ray dated September 16, 2013. FINDINGS: Stable cardiomegaly. Normal pulmonary vascularity. Mild bibasilar atelectasis. No focal consolidation, pleural effusion, or pneumothorax. No acute osseous abnormality. IMPRESSION: 1. Mild bibasilar atelectasis. Electronically Signed   By: Titus Dubin M.D.   On: 05/28/2021 20:40   ECHOCARDIOGRAM COMPLETE  Result Date: 05/29/2021    ECHOCARDIOGRAM REPORT   Patient Name:   Cathy Moody Date of Exam: 05/29/2021 Medical Rec #:  [id number]     Height:       60.0 in Accession #:    [id number]    Weight:       217.4 lb Date of Birth:  03/21/1951      BSA:          1.934 m Patient Age:    79 years      BP:           141/85 mmHg Patient Gender: F             HR:           81 bpm. Exam Location:  Inpatient Procedure: 2D Echo, Color Doppler and Cardiac Doppler Indications:    Fever R50.9  History:        Patient has prior history of Echocardiogram examinations, most                 recent 08/09/2014. CHF; Risk Factors:ETOH.                 Aortic Valve: 29 mm Sapien prosthetic, stented (TAVR) valve is                 present in the aortic position. Procedure Date: 08/09/13.  Sonographer:    Bernadene Person RDCS Referring Phys: [place]  1. Left ventricular ejection fraction, by estimation, is 60 to 65%. The left ventricle has normal function. The left ventricle has no regional wall motion abnormalities. There is moderate left ventricular hypertrophy. Left ventricular diastolic parameters are consistent with Grade I diastolic dysfunction (impaired relaxation). Elevated left ventricular end-diastolic pressure.  The E/e' is 67.  2. Right ventricular systolic function is low normal. The right ventricular size is normal. There is normal pulmonary artery systolic pressure. The estimated right ventricular systolic pressure is 73.5 mmHg.  3. Left atrial size was moderately dilated.  4. Right atrial size was mildly dilated.  5. The mitral valve is  abnormal. Trivial mitral valve regurgitation. Moderate mitral annular calcification.  6. Prostetic valve EOA is 0.46 cm2 - the AT is prolonged at 123 msec, jet contour is rounded, DI is 0.15 -features consistent with severe prosthetic valve stenosis. The aortic valve has been repaired/replaced. Aortic valve regurgitation is not visualized. Critical aortic stenosis. There is a 29 mm Sapien prosthetic (TAVR) valve present in the aortic position. Procedure Date: 08/09/13. Echo findings are consistent with stenosis and probable vegetation of the aortic prosthesis. Aortic valve area, by VTI measures 0.46 cm. Aortic valve mean gradient measures 93.2 mmHg. Aortic valve Vmax measures 5.82 m/s.  7. The inferior vena cava is normal in size with greater than 50% respiratory variability, suggesting right atrial pressure of 3 mmHg. Comparison(s): Changes from prior study are noted. 08/09/2014: LVEF 55-60%, s/p TAVR with mean gradient 8 mmHg. Conclusion(s)/Recommendation(s): Findings concerning for aortic valve vegetation of the bioprothesis, would recommend a Transesophageal Echocardiogram for clarification. Due to the above findings, cardiology consult is recommended. Critical findings reported to Dr. Loleta Books and acknowledged at 2:30 pm. FINDINGS  Left Ventricle: Left ventricular ejection fraction, by estimation, is 60 to 65%. The left ventricle has normal function. The left ventricle has no regional wall motion abnormalities. The left ventricular internal cavity size was normal in size. There is  moderate left ventricular hypertrophy. Left ventricular diastolic parameters are consistent with  Grade I diastolic dysfunction (impaired relaxation). Elevated left ventricular end-diastolic pressure. The E/e' is 22. Right Ventricle: The right ventricular size is normal. No increase in right ventricular wall thickness. Right ventricular systolic function is low normal. There is normal pulmonary artery systolic pressure. The tricuspid regurgitant velocity is 2.41 m/s,  and with an assumed right atrial pressure of 3 mmHg, the estimated right ventricular systolic pressure is 29.4 mmHg. Left Atrium: Left atrial size was moderately dilated. Right Atrium: Right atrial size was mildly dilated. Pericardium: There is no evidence of pericardial effusion. Mitral Valve: The mitral valve is abnormal. Moderate mitral annular calcification. Trivial mitral valve regurgitation. Tricuspid Valve: The tricuspid valve is grossly normal. Tricuspid valve regurgitation is trivial. Aortic Valve: Prostetic valve EROA is 0.46 cm2 - the AT is prolonged at 123 msec, jet contour is rounded -features consistent with prostetic valve stenosis. The aortic valve has been repaired/replaced. Aortic valve regurgitation is mild. Critical aortic stenosis. Aortic valve mean gradient measures 93.2 mmHg. Aortic valve peak gradient measures 135.7 mmHg. Aortic valve area, by VTI measures 0.46 cm. There is a 29 mm Sapien prosthetic, stented (TAVR) valve present in the aortic position. Procedure Date:  08/09/13. Pulmonic Valve: The pulmonic valve was normal in structure. Pulmonic valve regurgitation is not visualized. Aorta: The aortic root and ascending aorta are structurally normal, with no evidence of dilitation. Venous: The inferior vena cava is normal in size with greater than 50% respiratory variability, suggesting right atrial pressure of 3 mmHg. IAS/Shunts: No atrial level shunt detected by color flow Doppler.  LEFT VENTRICLE PLAX 2D LVIDd:         4.30 cm  Diastology LVIDs:         2.80 cm  LV e' medial:    5.96 cm/s LV PW:         1.30 cm  LV E/e'  medial:  17.1 LV IVS:        1.50 cm  LV e' lateral:   6.30 cm/s LVOT diam:     2.00 cm  LV E/e' lateral: 16.2 LV SV:  68 LV SV Index:   35 LVOT Area:     3.14 cm  RIGHT VENTRICLE RV S prime:     9.48 cm/s TAPSE (M-mode): 2.1 cm LEFT ATRIUM             Index       RIGHT ATRIUM           Index LA diam:        4.30 cm 2.22 cm/m  RA Area:     21.70 cm LA Vol (A2C):   72.5 ml 37.49 ml/m RA Volume:   67.60 ml  34.96 ml/m LA Vol (A4C):   75.8 ml 39.20 ml/m LA Biplane Vol: 74.4 ml 38.47 ml/m  AORTIC VALVE AV Area (Vmax):    0.52 cm AV Area (Vmean):   0.46 cm AV Area (VTI):     0.46 cm AV Vmax:           582.40 cm/s AV Vmean:          463.200 cm/s AV VTI:            1.480 m AV Peak Grad:      135.7 mmHg AV Mean Grad:      93.2 mmHg LVOT Vmax:         95.60 cm/s LVOT Vmean:        67.400 cm/s LVOT VTI:          0.217 m LVOT/AV VTI ratio: 0.15  AORTA Ao Asc diam: 3.10 cm MITRAL VALVE                TRICUSPID VALVE MV Area (PHT): 4.15 cm     TR Peak grad:   23.2 mmHg MV Decel Time: 183 msec     TR Vmax:        241.00 cm/s MV E velocity: 102.00 cm/s MV A velocity: 94.10 cm/s   SHUNTS MV E/A ratio:  1.08         Systemic VTI:  0.22 m                             Systemic Diam: 2.00 cm Lyman Bishop MD Electronically signed by Lyman Bishop MD Signature Date/Time: 05/29/2021/2:38:00 PM    Final    CT RENAL STONE STUDY  Result Date: 05/29/2021 CLINICAL DATA:  Lower left-sided back pain. EXAM: CT ABDOMEN AND PELVIS WITHOUT CONTRAST TECHNIQUE: Multidetector CT imaging of the abdomen and pelvis was performed following the standard protocol without IV contrast. COMPARISON:  None. FINDINGS: Lower chest: Very mild atelectasis is seen within the bilateral lung bases. Hepatobiliary: No focal liver abnormality is seen. Multiple subcentimeter gallstones are seen within an otherwise normal-appearing gallbladder. Pancreas: Unremarkable. No pancreatic ductal dilatation or surrounding inflammatory changes. Spleen: Normal in  size without focal abnormality. Adrenals/Urinary Tract: Adrenal glands are unremarkable. Kidneys are normal, without renal calculi, focal lesion, or hydronephrosis. Contrast is seen within the lumen of a mildly distended urinary bladder. Stomach/Bowel: Stomach is within normal limits. Appendix appears normal. No evidence of bowel wall thickening, distention, or inflammatory changes. Noninflamed diverticula are seen within the mid sigmoid colon. Vascular/Lymphatic: Aortic atherosclerosis. No enlarged abdominal or pelvic lymph nodes. Reproductive: Uterus and bilateral adnexa are unremarkable. Other: No abdominal wall hernia or abnormality. No abdominopelvic ascites. Musculoskeletal: No acute or significant osseous findings. IMPRESSION: 1. Cholelithiasis without evidence of acute cholecystitis. 2. Sigmoid diverticulosis. 3. Aortic atherosclerosis. Aortic Atherosclerosis (ICD10-I70.0). Electronically Signed   By: Hoover Browns  [place] M.D.   On: 05/29/2021 01:57   ECHO TEE  Result Date: 05/31/2021    TRANSESOPHOGEAL ECHO REPORT   Patient Name:   Cathy Moody Date of Exam: 05/31/2021 Medical Rec #:  [id number]     Height:       60.0 in Accession #:    [id number]    Weight:       217.4 lb Date of Birth:  09/10/1950      BSA:          1.934 m Patient Age:    57 years      BP:           122/77 mmHg Patient Gender: F             HR:           81 bpm. Exam Location:  Inpatient Procedure: Transesophageal Echo, Cardiac Doppler and Color Doppler Indications:     Endocarditis  History:         Patient has prior history of Echocardiogram examinations, most                  recent 05/29/2021.                  Aortic Valve: Edwards bioprosthetic valve is present in the                  aortic position.  Sonographer:     Philipp Deputy RDCS Referring Phys:  [id number] [place] [facility] Diagnosing Phys: Godfrey Pick Tobb DO PROCEDURE: After discussion of the risks and benefits of a TEE, an informed consent was obtained from the patient. The  transesophogeal probe was passed without difficulty through the esophogus of the patient. Imaged were obtained with the patient in a left lateral decubitus position. Local oropharyngeal anesthetic was provided with viscous lidocaine. Sedation performed by different physician. The patient was monitored while under deep sedation. Anesthestetic sedation was provided intravenously by Anesthesiology: 166m of Propofol. Image quality was adequate. The patient's vital signs; including heart rate, blood pressure, and oxygen saturation; remained stable throughout the procedure. The patient developed no complications during the procedure. IMPRESSIONS  1. Left ventricular ejection fraction, by estimation, is 60 to 65%. The left ventricle has normal function. The left ventricle has no regional wall motion abnormalities.  2. Right ventricular systolic function is normal. The right ventricular size is normal.  3. The mitral valve is normal in structure. Mild to moderate mitral valve regurgitation. No evidence of mitral stenosis.  4. There are two seperate highly mobile/oscillating masses appreciated on the aortic bioprosthesis highly suspected to be a vegetation. Aortic valve regurgitation is mild.  5. Severe aortic valve stenosis. 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 Aortic valve area, by VTI measures 0.43 cm. Aortic valve mean gradient measures 64.0 mmHg. Aortic valve Vmax measures 4.95 m/s.  6. The inferior vena cava is normal in size with greater than 50% respiratory variability, suggesting right atrial pressure of 3 mmHg.  7. No left atrial/left atrial appendage thrombus was detected. Conclusion(s)/Recommendation(s): Findings are concerning for vegetation/infective endocarditis as detailed above. May need Cardiac CTA for better visualization of the aortic annulus. FINDINGS  Left Ventricle: Left ventricular ejection fraction, by estimation, is 60 to 65%. The left ventricle has normal  function. The left ventricle has no regional wall motion abnormalities. The left ventricular internal cavity size was normal in size. There is  no left ventricular hypertrophy. Right Ventricle: The  right ventricular size is normal. No increase in right ventricular wall thickness. Right ventricular systolic function is normal. Left Atrium: Left atrial size was normal in size. No left atrial/left atrial appendage thrombus was detected. Right Atrium: Right atrial size was normal in size. Pericardium: There is no evidence of pericardial effusion. Mitral Valve: The mitral valve is normal in structure. Mild to moderate mitral valve regurgitation. No evidence of mitral valve stenosis. Tricuspid Valve: The tricuspid valve is normal in structure. Tricuspid valve regurgitation is not demonstrated. No evidence of tricuspid stenosis. Aortic Valve: There are two seperate highly mobile/oscillating masses appreciated on the aortic bioprosthesis highly suspected to be a vegetation. These can be seen seperately in the LVOT and the other on the aortic side of the valve. On the short axis veiw there is inward flow towards the aortic annulus that is not well defined. The aortic valve has been repaired/replaced. Aortic valve regurgitation is mild. Severe aortic stenosis is present. Aortic valve mean gradient measures 64.0 mmHg. Aortic valve  peak gradient measures 98.0 mmHg. Aortic valve area, by VTI measures 0.43 cm. 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 Pulmonic Valve: The pulmonic valve was normal in structure. Pulmonic valve regurgitation is not visualized. No evidence of pulmonic stenosis. Aorta: The aortic root is normal in size and structure. Venous: The inferior vena cava is normal in size with greater than 50% respiratory variability, suggesting right atrial pressure of 3 mmHg. IAS/Shunts: No atrial level shunt detected by color flow Doppler.  LEFT VENTRICLE PLAX 2D LVOT diam:     2.00 cm LV  SV:         50 LV SV Index:   26 LVOT Area:     3.14 cm  AORTIC VALVE AV Area (Vmax):    0.44 cm AV Area (Vmean):   0.39 cm AV Area (VTI):     0.43 cm AV Vmax:           495.00 cm/s AV Vmean:          386.000 cm/s AV VTI:            1.170 m AV Peak Grad:      98.0 mmHg AV Mean Grad:      64.0 mmHg LVOT Vmax:         70.00 cm/s LVOT Vmean:        48.000 cm/s LVOT VTI:          0.160 m LVOT/AV VTI ratio: 0.14  SHUNTS Systemic VTI:  0.16 m Systemic Diam: 2.00 cm Kardie Tobb DO Electronically signed by Berniece Salines DO Signature Date/Time: 05/31/2021/6:27:35 PM    Final    [place] EKG SITE RITE  Result Date: 06/05/2021 If Site Rite image not attached, placement could not be confirmed due to current cardiac rhythm.     Subjective: Patient seen and examined at bedside.  She feels much better and wants to go home today.  No overnight fever, vomiting reported.  Still has rash with some itching but the cream is helping.  Discharge Exam: Vitals:   06/04/21 2042 06/05/21 0548  BP: (!) 141/82 134/73  Pulse: 82 84  Resp: 18 18  Temp: 97.9 F (36.6 C) 98 F (36.7 C)  SpO2: 94% 94%    General: Pt is alert, awake, not in acute distress.  Looks chronically ill.  Currently on room air. Cardiovascular: rate controlled, S1/S2 + Respiratory: bilateral decreased breath sounds at bases Abdominal: Soft, morbidly obese, NT, ND,  bowel sounds + Extremities: Trace lower extremity edema; no cyanosis skin: Erythematous maculopapular rash on upper and lower back and around bilateral antecubital regions and lower arms and on abdominal wall as well.  No petechiae or other lesions    The results of significant diagnostics from this hospitalization (including imaging, microbiology, ancillary and laboratory) are listed below for reference.     Microbiology: Recent Results (from the past 240 hour(s))  Resp Panel by RT-PCR (Flu A&B, Covid) Nasopharyngeal Swab     Status: None   Collection Time: 05/28/21  7:41 PM    Specimen: Nasopharyngeal Swab; Nasopharyngeal(NP) swabs in vial transport medium  Result Value Ref Range Status   SARS Coronavirus 2 by RT PCR NEGATIVE NEGATIVE Final    Comment: (NOTE) SARS-CoV-2 target nucleic acids are NOT DETECTED.  The SARS-CoV-2 RNA is generally detectable in upper respiratory specimens during the acute phase of infection. The lowest concentration of SARS-CoV-2 viral copies this assay can detect is 138 copies/mL. A negative result does not preclude SARS-Cov-2 infection and should not be used as the sole basis for treatment or other patient management decisions. A negative result may occur with  improper specimen collection/handling, submission of specimen other than nasopharyngeal swab, presence of viral mutation(s) within the areas targeted by this assay, and inadequate number of viral copies(<138 copies/mL). A negative result must be combined with clinical observations, patient history, and epidemiological information. The expected result is Negative.  Fact Sheet for Patients:  EntrepreneurPulse.com.au  Fact Sheet for Healthcare Providers:  IncredibleEmployment.be  This test is no t yet approved or cleared by the [place] FDA and  has been authorized for detection and/or diagnosis of SARS-CoV-2 by FDA under an Emergency Use Authorization (EUA). This EUA will remain  in effect (meaning this test can be used) for the duration of the COVID-19 declaration under Section 564(b)(1) of the Act, 21 U.S.C.section 360bbb-3(b)(1), unless the authorization is terminated  or revoked sooner.       Influenza A by PCR NEGATIVE NEGATIVE Final   Influenza B by PCR NEGATIVE NEGATIVE Final    Comment: (NOTE) The Xpert Xpress SARS-CoV-2/FLU/RSV plus assay is intended as an aid in the diagnosis of influenza from Nasopharyngeal swab specimens and should not be used as a sole basis for treatment. Nasal washings and aspirates are  unacceptable for Xpert Xpress SARS-CoV-2/FLU/RSV testing.  Fact Sheet for Patients: EntrepreneurPulse.com.au  Fact Sheet for Healthcare Providers: IncredibleEmployment.be  This test is not yet approved or cleared by the [place] FDA and has been authorized for detection and/or diagnosis of SARS-CoV-2 by FDA under an Emergency Use Authorization (EUA). This EUA will remain in effect (meaning this test can be used) for the duration of the COVID-19 declaration under Section 564(b)(1) of the Act, 21 U.S.C. section 360bbb-3(b)(1), unless the authorization is terminated or revoked.  Performed at [facility], [place] [address], [place], [place] [address]   Blood Culture (routine x 2)     Status: Abnormal   Collection Time: 05/28/21  7:41 PM   Specimen: BLOOD  Result Value Ref Range Status   Specimen Description   Final    BLOOD BLOOD RIGHT FOREARM Performed at [facility] [address] [place], [place] [address]    Special Requests   Final    BOTTLES DRAWN AEROBIC AND ANAEROBIC Blood Culture results may not be optimal due to an excessive volume of blood received in culture bottles Performed at [place] [address] [place], [place] [address]    Culture  Setup Time   Final    GRAM POSITIVE COCCI IN CHAINS IN BOTH AEROBIC AND ANAEROBIC BOTTLES CRITICAL VALUE NOTED.  VALUE IS CONSISTENT WITH PREVIOUSLY REPORTED AND CALLED VALUE.    Culture (A)  Final    STREPTOCOCCUS MITIS/ORALIS SUSCEPTIBILITIES PERFORMED ON PREVIOUS CULTURE WITHIN THE LAST 5 DAYS. Performed at [facility], [place] [address] [place], [place] [address]    Report Status 05/31/2021 FINAL  Final  Blood Culture (routine x 2)     Status: Abnormal   Collection Time: 05/28/21  7:46 PM   Specimen: BLOOD LEFT FOREARM  Result Value Ref Range Status   Specimen Description   Final    BLOOD LEFT FOREARM Performed  at [facility], [place] [address] [place], [place] [address]    Special Requests   Final    BOTTLES DRAWN AEROBIC AND ANAEROBIC Blood Culture results may not be optimal due to an excessive volume of blood received in culture bottles Performed at [place] [address], [place], [place] [address]    Culture  Setup Time   Final    GRAM POSITIVE COCCI IN CHAINS IN BOTH AEROBIC AND ANAEROBIC BOTTLES CRITICAL RESULT CALLED TO, READ BACK BY AND VERIFIED WITH: [place]. [place] [id number] FCP Performed at [facility], [place] [address] [place], [place] [address]    Culture STREPTOCOCCUS MITIS/ORALIS (A)  Final   Report Status 05/31/2021 FINAL  Final   Organism ID, Bacteria STREPTOCOCCUS MITIS/ORALIS  Final      Susceptibility   Streptococcus mitis/oralis - MIC*    TETRACYCLINE 0.5 SENSITIVE Sensitive     VANCOMYCIN 0.5 SENSITIVE Sensitive     CLINDAMYCIN <=0.25 SENSITIVE Sensitive     PENICILLIN Value in next row Sensitive      SENSITIVEMIC =0.06S    * STREPTOCOCCUS MITIS/ORALIS  Blood Culture ID Panel (Reflexed)     Status: Abnormal   Collection Time: 05/28/21  7:46 PM  Result Value Ref Range Status   Enterococcus faecalis NOT DETECTED NOT DETECTED Final   Enterococcus Faecium NOT DETECTED NOT DETECTED Final   Listeria monocytogenes NOT DETECTED NOT DETECTED Final   Staphylococcus species NOT DETECTED NOT DETECTED Final   Staphylococcus aureus (BCID) NOT DETECTED NOT DETECTED Final   Staphylococcus epidermidis NOT DETECTED NOT DETECTED Final   Staphylococcus lugdunensis NOT DETECTED NOT DETECTED Final   Streptococcus species DETECTED (A) NOT DETECTED Final    Comment: Not Enterococcus species, Streptococcus agalactiae, Streptococcus pyogenes, or Streptococcus pneumoniae. CRITICAL RESULT CALLED TO, READ BACK BY AND VERIFIED WITH: PHARMD CHRISTINE S. [id number] FCP    Streptococcus agalactiae NOT DETECTED NOT DETECTED Final   Streptococcus  pneumoniae NOT DETECTED NOT DETECTED Final   Streptococcus pyogenes NOT DETECTED NOT DETECTED Final   A.calcoaceticus-baumannii NOT DETECTED NOT DETECTED Final   Bacteroides fragilis NOT DETECTED NOT DETECTED Final   Enterobacterales NOT DETECTED NOT DETECTED Final   Enterobacter cloacae complex NOT DETECTED NOT DETECTED Final   Escherichia coli NOT DETECTED NOT DETECTED Final   Klebsiella aerogenes NOT DETECTED NOT DETECTED Final   Klebsiella oxytoca NOT DETECTED NOT DETECTED Final   Klebsiella pneumoniae NOT DETECTED NOT DETECTED Final   Proteus species NOT DETECTED NOT DETECTED Final   Salmonella species NOT DETECTED NOT DETECTED Final   Serratia marcescens NOT DETECTED NOT DETECTED Final   Haemophilus influenzae NOT DETECTED NOT DETECTED Final   Neisseria meningitidis NOT DETECTED NOT  DETECTED Final   Pseudomonas aeruginosa NOT DETECTED NOT DETECTED Final   Stenotrophomonas maltophilia NOT DETECTED NOT DETECTED Final   Candida albicans NOT DETECTED NOT DETECTED Final   Candida auris NOT DETECTED NOT DETECTED Final   Candida glabrata NOT DETECTED NOT DETECTED Final   Candida krusei NOT DETECTED NOT DETECTED Final   Candida parapsilosis NOT DETECTED NOT DETECTED Final   Candida tropicalis NOT DETECTED NOT DETECTED Final   Cryptococcus neoformans/gattii NOT DETECTED NOT DETECTED Final    Comment: Performed at [facility], [place] [address], [place], [place] [address]  Urine Culture     Status: None   Collection Time: 05/28/21 10:37 PM   Specimen: In/Out Cath Urine  Result Value Ref Range Status   Specimen Description   Final    IN/OUT CATH URINE Performed at [facility] [address] [place], [place] [address]    Special Requests   Final    NONE Performed at [facility], [place] [address], [place], [place] [address]    Culture   Final    NO GROWTH Performed at [facility], [place] [address], [place], [place] [address]    Report  Status 05/29/2021 FINAL  Final  Respiratory (~20 pathogens) panel by PCR     Status: None   Collection Time: 05/28/21 11:07 PM   Specimen: Nasopharyngeal Swab; Respiratory  Result Value Ref Range Status   Adenovirus NOT DETECTED NOT DETECTED Final   Coronavirus 229E NOT DETECTED NOT DETECTED Final    Comment: (NOTE) The Coronavirus on the Respiratory Panel, DOES NOT test for the novel  Coronavirus (2019 nCoV)    Coronavirus HKU1 NOT DETECTED NOT DETECTED Final   Coronavirus NL63 NOT DETECTED NOT DETECTED Final   Coronavirus OC43 NOT DETECTED NOT DETECTED Final   Metapneumovirus NOT DETECTED NOT DETECTED Final   Rhinovirus / Enterovirus NOT DETECTED NOT DETECTED Final   Influenza A NOT DETECTED NOT DETECTED Final   Influenza B NOT DETECTED NOT DETECTED Final   Parainfluenza Virus 1 NOT DETECTED NOT DETECTED Final   Parainfluenza Virus 2 NOT DETECTED NOT DETECTED Final   Parainfluenza Virus 3 NOT DETECTED NOT DETECTED Final   Parainfluenza Virus 4 NOT DETECTED NOT DETECTED Final   Respiratory Syncytial Virus NOT DETECTED NOT DETECTED Final   Bordetella pertussis NOT DETECTED NOT DETECTED Final   Bordetella Parapertussis NOT DETECTED NOT DETECTED Final   Chlamydophila pneumoniae NOT DETECTED NOT DETECTED Final   Mycoplasma pneumoniae NOT DETECTED NOT DETECTED Final    Comment: Performed at [facility], [place]. [address], [place], [place] [address]  MRSA Next Gen by PCR, Nasal     Status: None   Collection Time: 05/29/21  6:38 AM   Specimen: Nasal Mucosa; Nasal Swab  Result Value Ref Range Status   MRSA by PCR Next Gen NOT DETECTED NOT DETECTED Final    Comment: (NOTE) The GeneXpert MRSA Assay (FDA approved for NASAL specimens only), is one component of a comprehensive MRSA colonization surveillance program. It is not intended to diagnose MRSA infection nor to guide or monitor treatment for MRSA infections. Test performance is not FDA approved in patients less than 53  years old. Performed at [facility], [place] [address], [place], [place] [address]   Culture, blood (routine x 2)     Status: None (Preliminary result)   Collection Time: 06/01/21  5:30 AM   Specimen: BLOOD  Result Value Ref Range Status  Specimen Description   Final    BLOOD RIGHT ANTECUBITAL Performed at [facility] [address] [place], [place] [address]    Special Requests   Final    BOTTLES DRAWN AEROBIC ONLY Blood Culture adequate volume Performed at [facility] [address] [place], [place] [address]    Culture   Final    NO GROWTH 4 DAYS Performed at [facility], [place] [address], [place], [place] [address]    Report Status PENDING  Incomplete     Labs: BNP (last 3 results) No results for input(s): BNP in the last 8760 hours. Basic Metabolic Panel: Recent Labs  Lab 05/30/21 0457 05/31/21 0445 06/02/21 0459 06/03/21 0453 06/04/21 0420 06/05/21 0440  NA 144 138 138 136 137 139  K 4.3 3.8 4.3 4.4 4.3 4.3  CL 113* 105 103 102 102 105  CO2 _0 GLUCOSE 216* 163* 160* 152* 158* 165*  BUN _1 CREATININE 0.89 0.83 0.72 0.74 0.77 0.70  CALCIUM 9.1 8.1* 8.2* 8.4* 8.6* 8.7*  MG 2.1  --  1.9  --  2.0 2.0   Liver Function Tests: No results for input(s): AST, ALT, ALKPHOS, BILITOT, PROT, ALBUMIN in the last 168 hours. No results for input(s): LIPASE, AMYLASE in the last 168 hours. No results for input(s): AMMONIA in the last 168 hours. CBC: Recent Labs  Lab 05/30/21 0457 05/31/21 0445 06/02/21 0459 06/04/21 0420 06/05/21 0440  WBC 31.4* 24.6* 15.9* 15.3* 12.0*  NEUTROABS  --   --  12.7* 11.6* 8.8*  HGB 11.8* 11.7* 11.5* 12.1 11.8*  HCT 35.2* 34.8* 35.5* 36.8 36.0  MCV 100.0 99.4 101.1* 99.5 101.1*  PLT 265 261 241 319 300   Cardiac Enzymes: No results for input(s): CKTOTAL, CKMB, CKMBINDEX, TROPONINI in the last 168 hours. BNP: Invalid input(s):  POCBNP CBG: Recent Labs  Lab 05/29/21 1855 05/29/21 2101 05/30/21 0735 05/30/21 1208  GLUCAP 214* 299* 188* 199*   D-Dimer No results for input(s): DDIMER in the last 72 hours. Hgb A1c No results for input(s): HGBA1C in the last 72 hours. Lipid Profile No results for input(s): CHOL, HDL, LDLCALC, TRIG, CHOLHDL, LDLDIRECT in the last 72 hours. Thyroid function studies No results for input(s): TSH, T4TOTAL, T3FREE, THYROIDAB in the last 72 hours.  Invalid input(s): FREET3 Anemia work up No results for input(s): VITAMINB12, FOLATE, FERRITIN, TIBC, IRON, RETICCTPCT in the last 72 hours. Urinalysis    Component Value Date/Time   COLORURINE YELLOW 05/28/2021 2237   APPEARANCEUR CLEAR 05/28/2021 2237   LABSPEC 1.016 05/28/2021 2237   PHURINE 5.0 05/28/2021 2237   GLUCOSEU NEGATIVE 05/28/2021 2237   HGBUR MODERATE (A) 05/28/2021 2237   BILIRUBINUR NEGATIVE 05/28/2021 2237   KETONESUR 5 (A) 05/28/2021 2237   PROTEINUR NEGATIVE 05/28/2021 2237   UROBILINOGEN 0.2 08/09/2013 0009   NITRITE NEGATIVE 05/28/2021 2237   LEUKOCYTESUR NEGATIVE 05/28/2021 2237   Sepsis Labs Invalid input(s): PROCALCITONIN,  WBC,  LACTICIDVEN Microbiology Recent Results (from the past 240 hour(s))  Resp Panel by RT-PCR (Flu A&B, Covid) Nasopharyngeal Swab     Status: None   Collection Time: 05/28/21  7:41 PM   Specimen: Nasopharyngeal Swab; Nasopharyngeal(NP) swabs in vial transport medium  Result Value Ref Range Status   SARS Coronavirus 2 by RT PCR NEGATIVE NEGATIVE Final    Comment: (NOTE) SARS-CoV-2 target nucleic acids are NOT DETECTED.  The SARS-CoV-2 RNA is generally detectable in  upper respiratory specimens during the acute phase of infection. The lowest concentration of SARS-CoV-2 viral copies this assay can detect is 138 copies/mL. A negative result does not preclude SARS-Cov-2 infection and should not be used as the sole basis for treatment or other patient management decisions. A  negative result may occur with  improper specimen collection/handling, submission of specimen other than nasopharyngeal swab, presence of viral mutation(s) within the areas targeted by this assay, and inadequate number of viral copies(<138 copies/mL). A negative result must be combined with clinical observations, patient history, and epidemiological information. The expected result is Negative.  Fact Sheet for Patients:  EntrepreneurPulse.com.au  Fact Sheet for Healthcare Providers:  IncredibleEmployment.be  This test is no t yet approved or cleared by the [place] FDA and  has been authorized for detection and/or diagnosis of SARS-CoV-2 by FDA under an Emergency Use Authorization (EUA). This EUA will remain  in effect (meaning this test can be used) for the duration of the COVID-19 declaration under Section 564(b)(1) of the Act, 21 U.S.C.section 360bbb-3(b)(1), unless the authorization is terminated  or revoked sooner.       Influenza A by PCR NEGATIVE NEGATIVE Final   Influenza B by PCR NEGATIVE NEGATIVE Final    Comment: (NOTE) The Xpert Xpress SARS-CoV-2/FLU/RSV plus assay is intended as an aid in the diagnosis of influenza from Nasopharyngeal swab specimens and should not be used as a sole basis for treatment. Nasal washings and aspirates are unacceptable for Xpert Xpress SARS-CoV-2/FLU/RSV testing.  Fact Sheet for Patients: EntrepreneurPulse.com.au  Fact Sheet for Healthcare Providers: IncredibleEmployment.be  This test is not yet approved or cleared by the [place] FDA and has been authorized for detection and/or diagnosis of SARS-CoV-2 by FDA under an Emergency Use Authorization (EUA). This EUA will remain in effect (meaning this test can be used) for the duration of the COVID-19 declaration under Section 564(b)(1) of the Act, 21 U.S.C. section 360bbb-3(b)(1), unless the authorization  is terminated or revoked.  Performed at [facility], [place] [address] [place], [place] [address]   Blood Culture (routine x 2)     Status: Abnormal   Collection Time: 05/28/21  7:41 PM   Specimen: BLOOD  Result Value Ref Range Status   Specimen Description   Final    BLOOD BLOOD RIGHT FOREARM Performed at [facility] [address], [place], [place] [address]    Special Requests   Final    BOTTLES DRAWN AEROBIC AND ANAEROBIC Blood Culture results may not be optimal due to an excessive volume of blood received in culture bottles Performed at [facility] [address] [place], [place] [address]    Culture  Setup Time   Final    GRAM POSITIVE COCCI IN CHAINS IN BOTH AEROBIC AND ANAEROBIC BOTTLES CRITICAL VALUE NOTED.  VALUE IS CONSISTENT WITH PREVIOUSLY REPORTED AND CALLED VALUE.    Culture (A)  Final    STREPTOCOCCUS MITIS/ORALIS SUSCEPTIBILITIES PERFORMED ON PREVIOUS CULTURE WITHIN THE LAST 5 DAYS. Performed at [facility], [place] [address], [place], [place] [address]    Report Status 05/31/2021 FINAL  Final  Blood Culture (routine x 2)     Status: Abnormal   Collection Time: 05/28/21  7:46 PM   Specimen: BLOOD LEFT FOREARM  Result Value Ref Range Status   Specimen Description   Final    BLOOD LEFT FOREARM Performed at [facility], [place] [address] [place], [place] [address]  Special Requests   Final    BOTTLES DRAWN AEROBIC AND ANAEROBIC Blood Culture results may not be optimal due to an excessive volume of blood received in culture bottles Performed at [facility] [address], [place], [place] [address]    Culture  Setup Time   Final    GRAM POSITIVE COCCI IN CHAINS IN BOTH AEROBIC AND ANAEROBIC BOTTLES CRITICAL RESULT CALLED TO, READ BACK BY AND VERIFIED WITH: [place]. [place] [id number] FCP Performed at [facility], [place] [address], [place], [place] [address]     Culture STREPTOCOCCUS MITIS/ORALIS (A)  Final   Report Status 05/31/2021 FINAL  Final   Organism ID, Bacteria STREPTOCOCCUS MITIS/ORALIS  Final      Susceptibility   Streptococcus mitis/oralis - MIC*    TETRACYCLINE 0.5 SENSITIVE Sensitive     VANCOMYCIN 0.5 SENSITIVE Sensitive     CLINDAMYCIN <=0.25 SENSITIVE Sensitive     PENICILLIN Value in next row Sensitive      SENSITIVEMIC =0.06S    * STREPTOCOCCUS MITIS/ORALIS  Blood Culture ID Panel (Reflexed)     Status: Abnormal   Collection Time: 05/28/21  7:46 PM  Result Value Ref Range Status   Enterococcus faecalis NOT DETECTED NOT DETECTED Final   Enterococcus Faecium NOT DETECTED NOT DETECTED Final   Listeria monocytogenes NOT DETECTED NOT DETECTED Final   Staphylococcus species NOT DETECTED NOT DETECTED Final   Staphylococcus aureus (BCID) NOT DETECTED NOT DETECTED Final   Staphylococcus epidermidis NOT DETECTED NOT DETECTED Final   Staphylococcus lugdunensis NOT DETECTED NOT DETECTED Final   Streptococcus species DETECTED (A) NOT DETECTED Final    Comment: Not Enterococcus species, Streptococcus agalactiae, Streptococcus pyogenes, or Streptococcus pneumoniae. CRITICAL RESULT CALLED TO, READ BACK BY AND VERIFIED WITH: PHARMD CHRISTINE S. [id number] FCP    Streptococcus agalactiae NOT DETECTED NOT DETECTED Final   Streptococcus pneumoniae NOT DETECTED NOT DETECTED Final   Streptococcus pyogenes NOT DETECTED NOT DETECTED Final   A.calcoaceticus-baumannii NOT DETECTED NOT DETECTED Final   Bacteroides fragilis NOT DETECTED NOT DETECTED Final   Enterobacterales NOT DETECTED NOT DETECTED Final   Enterobacter cloacae complex NOT DETECTED NOT DETECTED Final   Escherichia coli NOT DETECTED NOT DETECTED Final   Klebsiella aerogenes NOT DETECTED NOT DETECTED Final   Klebsiella oxytoca NOT DETECTED NOT DETECTED Final   Klebsiella pneumoniae NOT DETECTED NOT DETECTED Final   Proteus species NOT DETECTED NOT DETECTED Final   Salmonella  species NOT DETECTED NOT DETECTED Final   Serratia marcescens NOT DETECTED NOT DETECTED Final   Haemophilus influenzae NOT DETECTED NOT DETECTED Final   Neisseria meningitidis NOT DETECTED NOT DETECTED Final   Pseudomonas aeruginosa NOT DETECTED NOT DETECTED Final   Stenotrophomonas maltophilia NOT DETECTED NOT DETECTED Final   Candida albicans NOT DETECTED NOT DETECTED Final   Candida auris NOT DETECTED NOT DETECTED Final   Candida glabrata NOT DETECTED NOT DETECTED Final   Candida krusei NOT DETECTED NOT DETECTED Final   Candida parapsilosis NOT DETECTED NOT DETECTED Final   Candida tropicalis NOT DETECTED NOT DETECTED Final   Cryptococcus neoformans/gattii NOT DETECTED NOT DETECTED Final    Comment: Performed at [facility], [address], [place], [place] [address]  Urine Culture     Status: None   Collection Time: 05/28/21 10:37 PM   Specimen: In/Out Cath Urine  Result Value Ref Range Status   Specimen Description   Final    IN/OUT CATH URINE Performed at [facility], [place] [address], [place], [place] [address]    Special Requests   Final    NONE Performed at [facility], [place] [address] [place], [place] [address]    Culture   Final    NO GROWTH Performed at [facility], [place] [address] [place], [place] [address]    Report Status 05/29/2021 FINAL  Final  Respiratory (~20 pathogens) panel by PCR     Status: None   Collection Time: 05/28/21 11:07 PM   Specimen: Nasopharyngeal Swab; Respiratory  Result Value Ref Range Status   Adenovirus NOT DETECTED NOT DETECTED Final   Coronavirus 229E NOT DETECTED NOT DETECTED Final    Comment: (NOTE) The Coronavirus on the Respiratory Panel, DOES NOT test for the novel  Coronavirus (2019 nCoV)    Coronavirus HKU1 NOT DETECTED NOT DETECTED Final   Coronavirus NL63 NOT DETECTED NOT DETECTED Final   Coronavirus OC43 NOT DETECTED NOT DETECTED Final   Metapneumovirus NOT DETECTED NOT  DETECTED Final   Rhinovirus / Enterovirus NOT DETECTED NOT DETECTED Final   Influenza A NOT DETECTED NOT DETECTED Final   Influenza B NOT DETECTED NOT DETECTED Final   Parainfluenza Virus 1 NOT DETECTED NOT DETECTED Final   Parainfluenza Virus 2 NOT DETECTED NOT DETECTED Final   Parainfluenza Virus 3 NOT DETECTED NOT DETECTED Final   Parainfluenza Virus 4 NOT DETECTED NOT DETECTED Final   Respiratory Syncytial Virus NOT DETECTED NOT DETECTED Final   Bordetella pertussis NOT DETECTED NOT DETECTED Final   Bordetella Parapertussis NOT DETECTED NOT DETECTED Final   Chlamydophila pneumoniae NOT DETECTED NOT DETECTED Final   Mycoplasma pneumoniae NOT DETECTED NOT DETECTED Final    Comment: Performed at [facility], [place]. [address] [place], [place] [address]  MRSA Next Gen by PCR, Nasal     Status: None   Collection Time: 05/29/21  6:38 AM   Specimen: Nasal Mucosa; Nasal Swab  Result Value Ref Range Status   MRSA by PCR Next Gen NOT DETECTED NOT DETECTED Final    Comment: (NOTE) The GeneXpert MRSA Assay (FDA approved for NASAL specimens only), is one component of a comprehensive MRSA colonization surveillance program. It is not intended to diagnose MRSA infection nor to guide or monitor treatment for MRSA infections. Test performance is not FDA approved in patients less than 3 years old. Performed at [facility], [place] [address], [place], [place] [address]   Culture, blood (routine x 2)     Status: None (Preliminary result)   Collection Time: 06/01/21  5:30 AM   Specimen: BLOOD  Result Value Ref Range Status   Specimen Description   Final    BLOOD RIGHT ANTECUBITAL Performed at [facility] [address], [place], [place] [address]    Special Requests   Final    BOTTLES DRAWN AEROBIC ONLY Blood Culture adequate volume Performed at [facility] [address] [place], [place] [address]    Culture   Final    NO  GROWTH 4 DAYS Performed at [facility], [place] [address], [place], [place] [address]    Report Status PENDING  Incomplete     Time coordinating discharge: 35 minutes  SIGNED:   Aline August, MD  [facility] 06/05/2021, 10:54 AM

## 2021-06-05 NOTE — TOC Transition Note (Addendum)
Transition of Care [facility]) - CM/SW Discharge Note   Patient Details  Name: Cathy Moody MRN: [id number] Date of Birth: 1951/04/25  [facility] CM/SW Contact:  Lanier Clam, RN Phone Number: 06/05/2021, 10:50 AM   Clinical Narrative: Noted for d/c home w/iv abx-attempted to contact [facility] agency to accept for HHRN-ongoing iv abx teaching,labs,picc flush-unable to accept:[facility]. I have contacted [facility] rep [facility] aware.[facility] rep Pam to start initial teaching, will research if otpt iv infusion can provide service.   12p-Patient will receive initial instruction from Pam[facility]-they will provide the rocephin, & also ongoing instruction @ home. [facility] will provide weekly picc flush,care,& labs. F/u section of d/c. Patient in agreement,has own transportation home. 3:20p-Per [facility] rep Pam-received a call from [facility]-they will try to get [facility] to f/u with patient for [facility] visit in otpt setting. No further CM needs. 4p-Faxed to [facility] physicians orders for f/u in otpt setting for teaching iv abx in home.Will cancel patient care Center weekly labs f/u. No further CM needs.    Final next level of care: Home/Self Care Barriers to Discharge: No Barriers Identified   Patient Goals and CMS Choice Patient states their goals for this hospitalization and ongoing recovery are:: go home      Discharge Placement                       Discharge Plan and Services   Discharge Planning Services: CM Consult                      HH Arranged: IV Antibiotics HH Agency: [facility]        Social Determinants of Health (SDOH) Interventions     Readmission Risk Interventions No flowsheet data found.

## 2021-06-05 NOTE — Progress Notes (Addendum)
[facility] for Infectious Disease  Date of Admission:  05/28/2021           Reason for visit: Follow up on endocarditis  Current antibiotics: Cefazolin 10/9-present   Previous antibiotics: Azithromycin x 1 dose 10/4 Cefepime 10/4-10/5 Ceftriaxone 10/4-10/6 MTZ 10/4-10/5 Vanco 10/4 x 1 dose PCN G 10/7-10/9  ASSESSMENT:    70 y.o. female admitted with:  Prosthetic valve (TAVR) strep mitis endocarditis: Confirmed on TEE 05/31/2021 with blood cultures positive from 10/4 and cleared as of 10/8.  She is currently on cefazolin and improved.  Gentamicin was not initiated due to concern for aminoglycoside toxicities. Rash: Improving.  RECOMMENDATIONS:    Will transition to ceftriaxone at discharge for ease of dosing.  Plan for 6 weeks of IV antibiotics from blood culture clearance. PICC line order placed See OPAT note below.  Okay for discharge from ID standpoint once antibiotics arranged.  Please call as needed.   Diagnosis: Strep mitis prosthetic valve endocarditis  Culture Result: Strep mitis  Allergies  Allergen Reactions   Penicillin G Rash   Plavix [Clopidogrel Bisulfate] Rash    OPAT Orders Discharge antibiotics to be given via PICC line Discharge antibiotics: Per pharmacy protocol ceftriaxone 2gm q24h  Duration: 6 weeks End Date: 07/13/21  [facility] Care Per Protocol:  Home health RN for IV administration and teaching; PICC line care and labs.    Labs weekly while on IV antibiotics: _x_ CBC with differential __ BMP _x_ CMP __ CRP __ ESR __ Vancomycin trough __ CK  _x_ Please pull PIC at completion of IV antibiotics __ Please leave PIC in place until doctor has seen patient or been notified  Fax weekly labs to [phone]  Clinic Follow Up Appt: 06/27/21 @ 2:30pm with Dr Juleen China    Principal Problem:   Streptococcal bacteremia Active Problems:   COPD, severe ([place])   Pulmonary hypertension, moderate to severe ([place])   S/P TAVR  (transcatheter aortic valve replacement)   Morbid obesity ([place])   Hypothyroidism   Severe sepsis ([facility])   Acute metabolic encephalopathy   Alcohol abuse   Acute respiratory failure with hypoxia ([facility])   Hypomagnesemia   Infective endocarditis of aortic valve   Aortic valve endocarditis   Rash and nonspecific skin eruption    MEDICATIONS:    Scheduled Meds:  aspirin EC  81 mg Oral Daily   cetirizine  10 mg Oral Daily   enoxaparin (LOVENOX) injection  40 mg Subcutaneous Q24H   levothyroxine  112 mcg Oral Q0600   mometasone-formoterol  2 puff Inhalation BID   triamcinolone cream   Topical BID   Continuous Infusions:  sodium chloride Stopped (06/02/21 1532)   cefTRIAXone (ROCEPHIN)  IV     PRN Meds:.sodium chloride, acetaminophen **OR** acetaminophen, albuterol, albuterol, calamine, doxylamine (Sleep), HYDROcodone-acetaminophen, hydrOXYzine, ipratropium-albuterol  SUBJECTIVE:   24 hour events:  No acute events  Continues to feel better.  Rash is improved.  No fevers or chills.  Anxious to go home.  Review of Systems  All other systems reviewed and are negative.    OBJECTIVE:   Blood pressure 134/73, pulse 84, temperature 98 F (36.7 C), temperature source Oral, resp. rate 18, height 5' (1.524 m), weight 98.6 kg, SpO2 94 %. Body mass index is 42.45 kg/m.  Physical Exam Constitutional:      General: She is not in acute distress.    Appearance: Normal appearance.  HENT:     Head: Normocephalic and atraumatic.  Eyes:  Extraocular Movements: Extraocular movements intact.     Conjunctiva/sclera: Conjunctivae normal.  Pulmonary:     Effort: Pulmonary effort is normal. No respiratory distress.  Skin:    General: Skin is warm and dry.     Findings: Rash (improved) present.  Neurological:     General: No focal deficit present.     Mental Status: She is alert and oriented to person, place, and time.  Psychiatric:        Mood and Affect: Mood normal.         Behavior: Behavior normal.     Lab Results: Lab Results  Component Value Date   WBC 12.0 (H) 06/05/2021   HGB 11.8 (L) 06/05/2021   HCT 36.0 06/05/2021   MCV 101.1 (H) 06/05/2021   PLT 300 06/05/2021    Lab Results  Component Value Date   NA 139 06/05/2021   K 4.3 06/05/2021   CO2 29 06/05/2021   GLUCOSE 165 (H) 06/05/2021   BUN 12 06/05/2021   CREATININE 0.70 06/05/2021   CALCIUM 8.7 (L) 06/05/2021   GFRNONAA >60 06/05/2021   GFRAA >90 08/13/2013    Lab Results  Component Value Date   ALT 19 05/29/2021   AST 25 05/29/2021   ALKPHOS 73 05/29/2021   BILITOT 0.4 05/29/2021       Component Value Date/Time   CRP 4.2 (H) 06/05/2021 0440    No results found for: ESRSEDRATE   I have reviewed the micro and lab results in Epic.  Imaging: [place] EKG SITE RITE  Result Date: 06/05/2021 If Site Rite image not attached, placement could not be confirmed due to current cardiac rhythm.    Imaging independently reviewed in Epic.    Raynelle [facility] for Infectious Disease [facility] [phone] pager 06/05/2021, 9:44 AM  I spent greater than 35 minutes with the patient including greater than 50% of time in face to face counsel of the patient and in coordination of their care.

## 2021-06-06 ENCOUNTER — Encounter (HOSPITAL_COMMUNITY): Payer: PPO

## 2021-06-06 ENCOUNTER — Telehealth: Payer: Self-pay

## 2021-06-06 DIAGNOSIS — R6520 Severe sepsis without septic shock: Secondary | ICD-10-CM | POA: Diagnosis not present

## 2021-06-06 DIAGNOSIS — J9601 Acute respiratory failure with hypoxia: Secondary | ICD-10-CM | POA: Diagnosis not present

## 2021-06-06 DIAGNOSIS — I330 Acute and subacute infective endocarditis: Secondary | ICD-10-CM | POA: Diagnosis not present

## 2021-06-06 LAB — CULTURE, BLOOD (ROUTINE X 2)
Culture: NO GROWTH
Special Requests: ADEQUATE

## 2021-06-06 NOTE — Telephone Encounter (Signed)
Transition Care Management Follow-up Telephone Call Date of discharge and from where: Cathy Moody 06/05/21 How have you been since you were released from the hospital? No Any questions or concerns? No  Items Reviewed: Did the pt receive and understand the discharge instructions provided? Yes  Medications obtained and verified? Yes  Other? No  Any new allergies since your discharge? No  Dietary orders reviewed? No Do you have support at home? Yes   Home Care and Equipment/Supplies: Were home health services ordered? no  Functional Questionnaire: (I = Independent and D = Dependent) ADLs: I  Bathing/Dressing- I  Meal Prep- I  Eating- I  Maintaining continence- I  Transferring/Ambulation- I  Managing Meds- I  Follow up appointments reviewed:  PCP Hospital f/u appt confirmed? Yes  Scheduled to see Dr. Susann Givens on 06/11/21 @ 3:30p.m. Specialist Hospital f/u appt confirmed? Yes  Scheduled to see Cline Crock on 06/13/21 @ 1:30. Are transportation arrangements needed? No  If their condition worsens, is the pt aware to call PCP or go to the Emergency Dept.? Yes Was the patient provided with contact information for the PCP's office or ED? Yes Was to pt encouraged to call back with questions or concerns? Yes

## 2021-06-07 DIAGNOSIS — E039 Hypothyroidism, unspecified: Secondary | ICD-10-CM | POA: Diagnosis not present

## 2021-06-07 DIAGNOSIS — I350 Nonrheumatic aortic (valve) stenosis: Secondary | ICD-10-CM | POA: Diagnosis not present

## 2021-06-07 DIAGNOSIS — Z87891 Personal history of nicotine dependence: Secondary | ICD-10-CM | POA: Diagnosis not present

## 2021-06-07 DIAGNOSIS — Z792 Long term (current) use of antibiotics: Secondary | ICD-10-CM | POA: Diagnosis not present

## 2021-06-07 DIAGNOSIS — B955 Unspecified streptococcus as the cause of diseases classified elsewhere: Secondary | ICD-10-CM | POA: Diagnosis not present

## 2021-06-07 DIAGNOSIS — Z952 Presence of prosthetic heart valve: Secondary | ICD-10-CM | POA: Diagnosis not present

## 2021-06-07 DIAGNOSIS — Z452 Encounter for adjustment and management of vascular access device: Secondary | ICD-10-CM | POA: Diagnosis not present

## 2021-06-07 DIAGNOSIS — R7881 Bacteremia: Secondary | ICD-10-CM | POA: Diagnosis not present

## 2021-06-07 DIAGNOSIS — I2789 Other specified pulmonary heart diseases: Secondary | ICD-10-CM | POA: Diagnosis not present

## 2021-06-07 DIAGNOSIS — J441 Chronic obstructive pulmonary disease with (acute) exacerbation: Secondary | ICD-10-CM | POA: Diagnosis not present

## 2021-06-11 ENCOUNTER — Ambulatory Visit (INDEPENDENT_AMBULATORY_CARE_PROVIDER_SITE_OTHER): Payer: PPO | Admitting: Family Medicine

## 2021-06-11 ENCOUNTER — Encounter: Payer: Self-pay | Admitting: Family Medicine

## 2021-06-11 ENCOUNTER — Other Ambulatory Visit: Payer: Self-pay

## 2021-06-11 VITALS — BP 138/74 | HR 92 | Temp 98.7°F | Wt 214.6 lb

## 2021-06-11 DIAGNOSIS — J4540 Moderate persistent asthma, uncomplicated: Secondary | ICD-10-CM | POA: Diagnosis not present

## 2021-06-11 DIAGNOSIS — E039 Hypothyroidism, unspecified: Secondary | ICD-10-CM | POA: Diagnosis not present

## 2021-06-11 DIAGNOSIS — I358 Other nonrheumatic aortic valve disorders: Secondary | ICD-10-CM

## 2021-06-11 DIAGNOSIS — R197 Diarrhea, unspecified: Secondary | ICD-10-CM

## 2021-06-11 DIAGNOSIS — F411 Generalized anxiety disorder: Secondary | ICD-10-CM | POA: Diagnosis not present

## 2021-06-11 MED ORDER — LEVOTHYROXINE SODIUM 112 MCG PO TABS: 112.0000 ug | ORAL_TABLET | Freq: Every day | ORAL | 0 refills | Status: DC

## 2021-06-11 MED ORDER — ALBUTEROL SULFATE HFA 108 (90 BASE) MCG/ACT IN AERS: 2.0000 | INHALATION_SPRAY | Freq: Four times a day (QID) | RESPIRATORY_TRACT | 0 refills | Status: DC | PRN

## 2021-06-11 MED ORDER — ALPRAZOLAM 0.25 MG PO TABS: 0.2500 mg | ORAL_TABLET | Freq: Two times a day (BID) | ORAL | 0 refills | Status: AC | PRN

## 2021-06-11 MED ORDER — FLUTICASONE FUROATE-VILANTEROL 100-25 MCG/ACT IN AEPB: 1.0000 | INHALATION_SPRAY | Freq: Every day | RESPIRATORY_TRACT | 11 refills | Status: AC

## 2021-06-11 NOTE — Progress Notes (Signed)
   Subjective:    Patient ID: Cathy Moody, female    DOB: 14-Jan-1951, 70 y.o.   MRN: [id number]  HPI She is here for a posthospitalization follow-up.  She was admitted October 4 and sent home on the 12th.  The hospital record including discharge summary, x-rays and lab data was reviewed.  She did have aortic valve infective endocarditis.  It was shown to be a Streptococcus species.  She was followed by infectious disease and by cardiology for this.  She was sent home on Rocephin for 1 month.  She does have people at home that are helping her with the IV and seems to be doing well with that.  She does need follow-up on her thyroid function.  TSH in the hospital was elevated however she had been off her medicine for the last 10 days.  She also states that since she has been on the antibiotic she has had difficulty with diarrhea.  She would also like a refill on her asthma medicine as she is about to run out.  She does not use the inhaler very often.  She would also like a small dose of Xanax.  She has had this in the past and done quite nicely with that.   Review of Systems     Objective:   Physical Exam Alert and in no distress.  Cardiac exam shows a 3-4/6 SEM.  Lungs are clear to auscultation.  The PICC line appears normal with no evidence of infection.       Assessment & Plan:  Aortic valve endocarditis - Plan: CBC with Differential/Platelet, Comprehensive metabolic panel  Moderate persistent asthma without complication - Plan: albuterol (VENTOLIN HFA) 108 (90 Base) MCG/ACT inhaler, fluticasone furoate-vilanterol (BREO ELLIPTA) 100-25 MCG/ACT AEPB  Hypothyroidism, unspecified type - Plan: levothyroxine (SYNTHROID) 112 MCG tablet  Diarrhea, unspecified type - Plan: C difficile Toxins A+B W/Rflx, C difficile Toxins A+B W/Rflx I think it is reasonable to make sure she does not have C. difficile although I doubt it.  She is to return here in about 2 months for recheck on her thyroid function.   Discussed proper taking of the pill which she seems to be doing.  She will follow-up with infectious disease and with cardiology.

## 2021-06-12 ENCOUNTER — Encounter (HOSPITAL_COMMUNITY): Payer: PPO

## 2021-06-12 DIAGNOSIS — R7881 Bacteremia: Secondary | ICD-10-CM | POA: Diagnosis not present

## 2021-06-12 DIAGNOSIS — Z452 Encounter for adjustment and management of vascular access device: Secondary | ICD-10-CM | POA: Diagnosis not present

## 2021-06-12 DIAGNOSIS — B955 Unspecified streptococcus as the cause of diseases classified elsewhere: Secondary | ICD-10-CM | POA: Diagnosis not present

## 2021-06-12 LAB — COMPREHENSIVE METABOLIC PANEL
ALT: 12 IU/L (ref 0–32)
AST: 13 IU/L (ref 0–40)
Albumin/Globulin Ratio: 1.3 (ref 1.2–2.2)
Albumin: 4 g/dL (ref 3.8–4.8)
Alkaline Phosphatase: 87 IU/L (ref 44–121)
BUN/Creatinine Ratio: 12 (ref 12–28)
BUN: 9 mg/dL (ref 8–27)
Bilirubin Total: 0.3 mg/dL (ref 0.0–1.2)
CO2: 24 mmol/L (ref 20–29)
Calcium: 9.4 mg/dL (ref 8.7–10.3)
Chloride: 101 mmol/L (ref 96–106)
Creatinine, Ser: 0.74 mg/dL (ref 0.57–1.00)
Globulin, Total: 3.1 g/dL (ref 1.5–4.5)
Glucose: 133 mg/dL — ABNORMAL HIGH (ref 70–99)
Potassium: 4.9 mmol/L (ref 3.5–5.2)
Sodium: 140 mmol/L (ref 134–144)
Total Protein: 7.1 g/dL (ref 6.0–8.5)
eGFR: 88 mL/min/{1.73_m2} (ref >59)

## 2021-06-12 LAB — CBC WITH DIFFERENTIAL/PLATELET
Basophils Absolute: 0.1 10*3/uL (ref 0.0–0.2)
Basos: 1 %
EOS (ABSOLUTE): 0.5 10*3/uL — ABNORMAL HIGH (ref 0.0–0.4)
Eos: 4 %
Hematocrit: 35 % (ref 34.0–46.6)
Hemoglobin: 11.9 g/dL (ref 11.1–15.9)
Immature Grans (Abs): 0.1 10*3/uL (ref 0.0–0.1)
Immature Granulocytes: 1 %
Lymphocytes Absolute: 2 10*3/uL (ref 0.7–3.1)
Lymphs: 15 %
MCH: 32.6 pg (ref 26.6–33.0)
MCHC: 34 g/dL (ref 31.5–35.7)
MCV: 96 fL (ref 79–97)
Monocytes Absolute: 0.9 10*3/uL (ref 0.1–0.9)
Monocytes: 7 %
Neutrophils Absolute: 9.5 10*3/uL — ABNORMAL HIGH (ref 1.4–7.0)
Neutrophils: 72 %
Platelets: 448 10*3/uL (ref 150–450)
RBC: 3.65 x10E6/uL — ABNORMAL LOW (ref 3.77–5.28)
RDW: 11.9 % (ref 11.7–15.4)
WBC: 13 10*3/uL — ABNORMAL HIGH (ref 3.4–10.8)

## 2021-06-13 ENCOUNTER — Ambulatory Visit (INDEPENDENT_AMBULATORY_CARE_PROVIDER_SITE_OTHER): Payer: PPO | Admitting: Physician Assistant

## 2021-06-13 ENCOUNTER — Other Ambulatory Visit: Payer: Self-pay

## 2021-06-13 ENCOUNTER — Encounter: Payer: Self-pay | Admitting: Physician Assistant

## 2021-06-13 VITALS — BP 112/78 | HR 103 | Ht 60.0 in | Wt 211.6 lb

## 2021-06-13 DIAGNOSIS — J449 Chronic obstructive pulmonary disease, unspecified: Secondary | ICD-10-CM

## 2021-06-13 DIAGNOSIS — T8209XS Other mechanical complication of heart valve prosthesis, sequela: Secondary | ICD-10-CM

## 2021-06-13 DIAGNOSIS — K089 Disorder of teeth and supporting structures, unspecified: Secondary | ICD-10-CM | POA: Diagnosis not present

## 2021-06-13 DIAGNOSIS — I350 Nonrheumatic aortic (valve) stenosis: Secondary | ICD-10-CM

## 2021-06-13 DIAGNOSIS — I330 Acute and subacute infective endocarditis: Secondary | ICD-10-CM

## 2021-06-13 NOTE — Progress Notes (Signed)
[facility] [place]                                     Cardiology Office Note:    Date:  06/13/2021   ID:  Cathy Moody, DOB 1950/12/22, MRN [id number]  PCP:  Denita Lung, MD  [facility] Cardiologist:  Dorris Carnes, MD / Dr. Burt Knack & Cyndia Bent (TAVR) [place] Electrophysiologist:  None   Referring MD: Denita Lung, MD   Follow up severe AS, TAVR valve endocarditis  History of Present Illness:    Cathy Moody is a 70 y.o. female with a hx of AS s/p TAVR in 9485, chronic systolic heart failure, NICM, tobacco abuse, COPD, and morbid obesity who was recently admitted and diagnosed with prosthetic valve dysfunction with critical AS as well as prosthetic valve endocarditis. Today she presents to clinic for follow up.   Cathy Moody established care with [facility] during a hospitalization 2014 found to have severe aortic valve stenosis and LVEF 25%. Heart cath showed no obstructive disease at that time. She was felt to be at extremely high risk for open surgical AVR and even higher risk for Bentall due to low EF, severe COPD with PCO2 60, pulmonary HTN, & morbid obesity. She did have a 4.5 cm ascending aorta that was left alone because her long term prognosis for survival was felt to be low. She underwent successful TAVR with a 29 mm Edwards Sapien THV via the transapical approach on 08/09/13 by Drs Burt Knack and Cyndia Bent. Post op day 1 showed LVEF 55-60%, s/p TAVR with mean gradient 8 mmHg. Hospital course was complicated by pleural effusion requiring thoracentesis x 2. She also developed a rash on plavix. She was switched to effient. She was lost to follow up in 2015 with no repeat echos.   She was admitted from 10/4-10/12/22 for streptococcus mitis/oralis bacteremia and found to have aortic valve infective endocarditis with critical AS. 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 Follow up TEE showed restricted motion of aortic leaflets with mobile densities consistent with vegetations.  One mobile density was quite bright in character, calcified questioning the chronicity of the infection. She was treated with IV Penicillin G which was later switched to Rocephin due to a rash. This is administered through a PICC line and to be discontinued after 6 weeks, ~11/18.  Today the patient presents to clinic for follow up. Here alone. She lives alone and still drives. She had chronic dyspnea that is unchanged. Presented to hospital with fevers and chills. Has significant fatigue. No CP. No LE edema, orthopnea or PND. No dizziness or syncope. No blood in stool or urine. No palpitations. Feels best when out with a shopping cart.    Past Medical History:  Diagnosis Date   Acute CHF ([place])    Archie Endo 07/29/2013   Alcohol abuse    /notes 07/29/2013   Asthma    Hypotension    Morbid obesity ([place])    S/P TAVR (transcatheter aortic valve replacement)     Past Surgical History:  Procedure Laterality Date   INTRAOPERATIVE TRANSESOPHAGEAL ECHOCARDIOGRAM N/A 08/09/2013   Procedure: INTRAOPERATIVE TRANSESOPHAGEAL ECHOCARDIOGRAM;  Surgeon: Gaye Pollack, MD;  Location: [place];  Service: Open Heart Surgery;  Laterality: N/A;  LEFT AND RIGHT HEART CATHETERIZATION WITH CORONARY ANGIOGRAM N/A 08/01/2013   Procedure: LEFT AND RIGHT HEART CATHETERIZATION WITH CORONARY ANGIOGRAM;  Surgeon: Sinclair Grooms, MD;  Location: [facility] CATH LAB;  Service: Cardiovascular;  Laterality: N/A;   TEE WITHOUT CARDIOVERSION N/A 05/31/2021   Procedure: TRANSESOPHAGEAL ECHOCARDIOGRAM (TEE);  Surgeon: Berniece Salines, DO;  Location: [facility] ENDOSCOPY;  Service: Cardiovascular;  Laterality: N/A;   TRANSCATHETER AORTIC VALVE REPLACEMENT, TRANSAPICAL Left 08/09/2013   Procedure: TRANSCATHETER AORTIC VALVE REPLACEMENT, TRANSAPICAL;  Surgeon: Gaye Pollack, MD;  Location: [facility] OR;  Service: Open Heart  Surgery;  Laterality: Left;    Current Medications: Current Meds  Medication Sig   albuterol (VENTOLIN HFA) 108 (90 Base) MCG/ACT inhaler Inhale 2 puffs into the lungs every 6 (six) hours as needed for wheezing or shortness of breath.   ALPRAZolam (XANAX) 0.25 MG tablet Take 1 tablet (0.25 mg total) by mouth 2 (two) times daily as needed for anxiety.   aspirin EC 81 MG EC tablet Take 1 tablet (81 mg total) by mouth daily.   cefTRIAXone (ROCEPHIN) IVPB Inject 2 g into the vein daily. Indication:  Endocarditis  First Dose: No Last Day of Therapy:  07/13/2021 Labs - Once weekly:  CBC/D and BMP, Labs - Every other week:  ESR and CRP Method of administration: IV Push Method of administration may be changed at the discretion of home infusion pharmacist based upon assessment of the patient and/or caregiver's ability to self-administer the medication ordered.   fluticasone furoate-vilanterol (BREO ELLIPTA) 100-25 MCG/ACT AEPB Inhale 1 puff into the lungs daily.   levothyroxine (SYNTHROID) 112 MCG tablet Take 1 tablet (112 mcg total) by mouth daily before breakfast.     Allergies:   Penicillin g and Plavix [clopidogrel bisulfate]   Social History   Socioeconomic History   Marital status: Divorced    Spouse name: Not on file   Number of children: Not on file   Years of education: Not on file   Highest education level: Not on file  Occupational History   Not on file  Tobacco Use   Smoking status: Former    Types: Cigarettes    Quit date: 07/29/2013    Years since quitting: 7.8   Smokeless tobacco: Never  Vaping Use   Vaping Use: Never used  Substance and Sexual Activity   Alcohol use: Yes    Comment: Pt is weekend drinker   Drug use: No   Sexual activity: Not Currently  Other Topics Concern   Not on file  Social History Narrative   Not on file   Social Determinants of Health   Financial Resource Strain: Not on file  Food Insecurity: Not on file  Transportation Needs: Not on  file  Physical Activity: Not on file  Stress: Not on file  Social Connections: Not on file     Family History: The patient's family history includes Diabetes in her paternal grandmother; Liver disease in her son.  ROS:   Please see the history of present illness.    All other systems reviewed and are negative.  EKGs/Labs/Other Studies Reviewed:    The following studies were reviewed today:  Echo 05/29/21 IMPRESSIONS   1. Left ventricular ejection fraction, by estimation, is 60 to 65%. The  left ventricle has normal function. The left ventricle has no regional  wall motion abnormalities. There is moderate left ventricular hypertrophy.  Left ventricular diastolic  parameters are consistent with Grade I diastolic dysfunction (impaired  relaxation). Elevated left ventricular  end-diastolic pressure. The E/e' is  43.   2. Right ventricular systolic function is low normal. The right  ventricular size is normal. There is normal pulmonary artery systolic  pressure. The estimated right ventricular systolic pressure is 58.5 mmHg.   3. Left atrial size was moderately dilated.   4. Right atrial size was mildly dilated.   5. The mitral valve is abnormal. Trivial mitral valve regurgitation.  Moderate mitral annular calcification.   6. Prostetic valve EOA is 0.46 cm2 - the AT is prolonged at 123 msec, jet  contour is rounded, DI is 0.15 -features consistent with severe prosthetic  valve stenosis. The aortic valve has been repaired/replaced. Aortic valve  regurgitation is not  visualized. Critical aortic stenosis. There is a 29 mm Sapien prosthetic  (TAVR) valve present in the aortic position. Procedure Date: 08/09/13.  Echo findings are consistent with stenosis and probable vegetation of the  aortic prosthesis. Aortic valve  area, by VTI measures 0.46 cm. Aortic valve mean gradient measures 93.2  mmHg. Aortic valve Vmax measures 5.82 m/s.   7. The inferior vena cava is normal in size with  greater than 50%  respiratory variability, suggesting right atrial pressure of 3 mmHg.   Comparison(s): Changes from prior study are noted. 08/09/2014: LVEF  55-60%, s/p TAVR with mean gradient 8 mmHg.   Conclusion(s)/Recommendation(s): Findings concerning for aortic valve  vegetation of the bioprothesis, would recommend a Transesophageal  Echocardiogram for clarification. Due to the above findings, cardiology  consult is recommended. Critical findings  reported to Dr. Loleta Books and acknowledged at 2:30 pm.   ________________________  TEE 05/31/21 IMPRESSIONS   1. Left ventricular ejection fraction, by estimation, is 60 to 65%. The  left ventricle has normal function. The left ventricle has no regional  wall motion abnormalities.   2. Right ventricular systolic function is normal. The right ventricular  size is normal.   3. The mitral valve is normal in structure. Mild to moderate mitral valve  regurgitation. No evidence of mitral stenosis.   4. There are two seperate highly mobile/oscillating masses appreciated on  the aortic bioprosthesis highly suspected to be a vegetation. Aortic valve  regurgitation is mild.   5. Severe aortic valve stenosis. There is a Edwards bioprosthetic valve  present in the aortic position. Aortic valve area, by VTI measures 0.43  cm. Aortic valve mean gradient measures 64.0 mmHg. Aortic valve Vmax  measures 4.95 m/s.   6. The inferior vena cava is normal in size with greater than 50%  respiratory variability, suggesting right atrial pressure of 3 mmHg.   7. No left atrial/left atrial appendage thrombus was detected.   Conclusion(s)/Recommendation(s): Findings are concerning for  vegetation/infective endocarditis as detailed above. May need Cardiac CTA  for better visualization of the aortic annulus.   EKG:  EKG is NOT ordered today.    Recent Labs: 05/29/2021: TSH 10.760 06/05/2021: Magnesium 2.0 06/11/2021: ALT 12; BUN 9; Creatinine, Ser 0.74;  Hemoglobin 11.9; Platelets 448; Potassium 4.9; Sodium 140  Recent Lipid Panel    Component Value Date/Time   CHOL 228 (H) 08/10/2017 1556   TRIG 71 08/10/2017 1556   HDL 70 08/10/2017 1556   CHOLHDL 3.3 08/10/2017 1556   VLDL 25 02/28/2016 1449   LDLCALC 141 (H) 08/10/2017 1556     Risk Assessment/Calculations:       Physical Exam:    VS:  BP 112/78   Pulse (!) 103   Ht 5' (1.524 m)   Wt 211 lb 9.6  oz (96 kg)   SpO2 96%   BMI 41.33 kg/m     Wt Readings from Last 3 Encounters:  06/13/21 211 lb 9.6 oz (96 kg)  06/11/21 214 lb 9.6 oz (97.3 kg)  05/31/21 217 lb 6 oz (98.6 kg)     GEN:  Well nourished, well developed in no acute distress, obese HEENT: Normal NECK: No JVD LYMPHATICS: No lymphadenopathy CARDIAC: RRR, 3/6 SEM. No rubs, gallops RESPIRATORY:  Clear to auscultation without rales, wheezing or rhonchi  ABDOMEN: Soft, non-tender, non-distended MUSCULOSKELETAL:  No edema; No deformity  SKIN: Warm and dry NEUROLOGIC:  Alert and oriented x 3 PSYCHIATRIC:  Normal affect   ASSESSMENT:    1. Prosthetic valve dysfunction, sequela   2. Infective endocarditis of aortic valve   3. Critical aortic valve stenosis   4. Chronic obstructive pulmonary disease, unspecified COPD type ([place])   5. Poor dentition    PLAN:    In order of problems listed above:  Bioprosthetic/TAVR valve endocarditis: has picc line and on IV Rocephin. Has ID follow up 11/3. Blood cultures grew strep mitis/oralis. I looked at her teeth which are in poor condition. I will send her to Dr. Benson Norway for dental consultation and possible extraction.   Critical AS: has follow up with Dr. Burt Knack 11/28 and repeat echo 1/8. Per review of hospital notes, Dr cooper recommended "follow-up of  gradients with repeat echocardiogram in 3 months.  If gradient is still high, consider anticoagulation for possible thrombus, but this is not recommended right now."  COPD: severe. Continue home inhalers.     Poor  dentition: as above, referral sent to Dr. Benson Norway.  Medication Adjustments/Labs and Tests Ordered: Current medicines are reviewed at length with the patient today.  Concerns regarding medicines are outlined above.  Orders Placed This Encounter  Procedures   Ambulatory referral to Dentistry    No orders of the defined types were placed in this encounter.   Patient Instructions  Medication Instructions:  Your physician recommends that you continue on your current medications as directed. Please refer to the Current Medication list given to you today.  *If you need a refill on your cardiac medications before your next appointment, please call your pharmacy*   Lab Work: None ordered   If you have labs (blood work) drawn today and your tests are completely normal, you will receive your results only by: [place] (if you have MyChart) OR A paper copy in the mail If you have any lab test that is abnormal or we need to change your treatment, we will call you to review the results.   Testing/Procedures: None ordered    Follow-Up: Follow up as scheduled    Other Instructions Your physician has referred you to see Dr. Sandi Mariscal for dental care   Signed, Angelena Form, PA-C  06/13/2021 4:15 PM    [place]

## 2021-06-13 NOTE — Patient Instructions (Addendum)
Medication Instructions:  Your physician recommends that you continue on your current medications as directed. Please refer to the Current Medication list given to you today.  *If you need a refill on your cardiac medications before your next appointment, please call your pharmacy*   Lab Work: None ordered   If you have labs (blood work) drawn today and your tests are completely normal, you will receive your results only by: MyChart Message (if you have MyChart) OR A paper copy in the mail If you have any lab test that is abnormal or we need to change your treatment, we will call you to review the results.   Testing/Procedures: None ordered    Follow-Up: Follow up as scheduled    Other Instructions Your physician has referred you to see Dr. Alda Berthold for dental care

## 2021-06-15 DIAGNOSIS — I330 Acute and subacute infective endocarditis: Secondary | ICD-10-CM | POA: Diagnosis not present

## 2021-06-15 DIAGNOSIS — R6520 Severe sepsis without septic shock: Secondary | ICD-10-CM | POA: Diagnosis not present

## 2021-06-15 DIAGNOSIS — J9601 Acute respiratory failure with hypoxia: Secondary | ICD-10-CM | POA: Diagnosis not present

## 2021-06-19 DIAGNOSIS — Z87891 Personal history of nicotine dependence: Secondary | ICD-10-CM | POA: Diagnosis not present

## 2021-06-19 DIAGNOSIS — R54 Age-related physical debility: Secondary | ICD-10-CM | POA: Diagnosis not present

## 2021-06-19 DIAGNOSIS — R7881 Bacteremia: Secondary | ICD-10-CM | POA: Diagnosis not present

## 2021-06-19 DIAGNOSIS — J441 Chronic obstructive pulmonary disease with (acute) exacerbation: Secondary | ICD-10-CM | POA: Diagnosis not present

## 2021-06-19 DIAGNOSIS — E039 Hypothyroidism, unspecified: Secondary | ICD-10-CM | POA: Diagnosis not present

## 2021-06-22 DIAGNOSIS — R6520 Severe sepsis without septic shock: Secondary | ICD-10-CM | POA: Diagnosis not present

## 2021-06-22 DIAGNOSIS — J9601 Acute respiratory failure with hypoxia: Secondary | ICD-10-CM | POA: Diagnosis not present

## 2021-06-22 DIAGNOSIS — I330 Acute and subacute infective endocarditis: Secondary | ICD-10-CM | POA: Diagnosis not present

## 2021-06-25 ENCOUNTER — Ambulatory Visit: Payer: PPO | Admitting: Physician Assistant

## 2021-06-26 DIAGNOSIS — J441 Chronic obstructive pulmonary disease with (acute) exacerbation: Secondary | ICD-10-CM | POA: Diagnosis not present

## 2021-06-26 DIAGNOSIS — R54 Age-related physical debility: Secondary | ICD-10-CM | POA: Diagnosis not present

## 2021-06-26 DIAGNOSIS — Z452 Encounter for adjustment and management of vascular access device: Secondary | ICD-10-CM | POA: Diagnosis not present

## 2021-06-26 DIAGNOSIS — R7881 Bacteremia: Secondary | ICD-10-CM | POA: Diagnosis not present

## 2021-06-26 DIAGNOSIS — E039 Hypothyroidism, unspecified: Secondary | ICD-10-CM | POA: Diagnosis not present

## 2021-06-26 DIAGNOSIS — B955 Unspecified streptococcus as the cause of diseases classified elsewhere: Secondary | ICD-10-CM | POA: Diagnosis not present

## 2021-06-26 DIAGNOSIS — Z5181 Encounter for therapeutic drug level monitoring: Secondary | ICD-10-CM | POA: Diagnosis not present

## 2021-06-26 DIAGNOSIS — Z87891 Personal history of nicotine dependence: Secondary | ICD-10-CM | POA: Diagnosis not present

## 2021-06-27 ENCOUNTER — Encounter: Payer: Self-pay | Admitting: Internal Medicine

## 2021-06-27 ENCOUNTER — Ambulatory Visit (INDEPENDENT_AMBULATORY_CARE_PROVIDER_SITE_OTHER): Payer: PPO | Admitting: Internal Medicine

## 2021-06-27 ENCOUNTER — Other Ambulatory Visit: Payer: Self-pay

## 2021-06-27 VITALS — BP 186/111 | HR 94 | Temp 97.9°F | Resp 16 | Ht 60.0 in | Wt 206.0 lb

## 2021-06-27 DIAGNOSIS — I330 Acute and subacute infective endocarditis: Secondary | ICD-10-CM | POA: Diagnosis not present

## 2021-06-27 DIAGNOSIS — B955 Unspecified streptococcus as the cause of diseases classified elsewhere: Secondary | ICD-10-CM | POA: Diagnosis not present

## 2021-06-27 DIAGNOSIS — R7881 Bacteremia: Secondary | ICD-10-CM

## 2021-06-27 DIAGNOSIS — K029 Dental caries, unspecified: Secondary | ICD-10-CM | POA: Diagnosis not present

## 2021-06-27 NOTE — Progress Notes (Signed)
[facility]  CHIEF COMPLAINT:    Follow up for prosthetic valve endocarditis.  SUBJECTIVE:    Cathy Moody is a 70 y.o. female with PMHx as below who presents to the clinic for PVE.   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  She was treated with PCN G and transitioned to ceftriaxone at discharge via PICC line due to ease of dosing.  She has been managing her IV antibiotics at home.  She has not been having fevers/chills.  She does have chronic dyspnea with exertion. She also reports fatigue and cough.  She complains of diarrhea as well on antibiotics.  She will complete 6 weeks of IV antibiotics on ~07/12/21.  She did not have dental imaging during her hospitalization but has been noted to have poor dentition.  Her cardiologist saw her 10/20 and has referred her to dentistry.    Please see A&P for the details of today's visit and status of the patient's medical problems.   Patient's Medications  New Prescriptions   No medications on file  Previous Medications   ALBUTEROL (VENTOLIN HFA) 108 (90 BASE) MCG/ACT INHALER    Inhale 2 puffs into the lungs every 6 (six) hours as needed for wheezing or shortness of breath.   ALPRAZOLAM (XANAX) 0.25 MG TABLET    Take 1 tablet (0.25 mg total) by mouth 2 (two) times daily as needed for anxiety.   ASPIRIN EC 81 MG EC TABLET    Take 1 tablet (81 mg total) by mouth daily.   CALAMINE LOTION    Apply topically 2 (two) times daily as needed for itching (twice daily prn).   CEFTRIAXONE (ROCEPHIN) IVPB    Inject 2 g into the vein daily. Indication:  Endocarditis  First Dose: No Last Day of Therapy:  07/13/2021 Labs - Once weekly:  CBC/D and BMP, Labs - Every other week:  ESR and CRP Method of administration: IV Push Method of administration may be changed at the discretion of home infusion pharmacist based upon  assessment of the patient and/or caregiver's ability to self-administer the medication ordered.   FLUTICASONE FUROATE-VILANTEROL (BREO ELLIPTA) 100-25 MCG/ACT AEPB    Inhale 1 puff into the lungs daily.   LEVOTHYROXINE (SYNTHROID) 112 MCG TABLET    Take 1 tablet (112 mcg total) by mouth daily before breakfast.   TRIAMCINOLONE CREAM (KENALOG) 0.5 %    Apply topically 2 (two) times daily. Apply over affected area twice daily  Modified Medications   No medications on file  Discontinued Medications   No medications on file      Past Medical History:  Diagnosis Date   Acute CHF ([place])    Archie Endo 07/29/2013   Alcohol abuse    /notes 07/29/2013   Asthma    Hypotension    Morbid obesity ([place])    S/P TAVR (transcatheter aortic valve replacement)     Social History   Tobacco Use   Smoking status: Former    Types: Cigarettes    Quit date: 07/29/2013    Years since quitting: 7.9   Smokeless tobacco: Never  Vaping Use   Vaping Use: Never used  Substance Use Topics   Alcohol use: Yes    Comment: Pt is weekend drinker   Drug use: No    Family History  Problem Relation Age of Onset   Liver disease Son  Diabetes Paternal Grandmother     Allergies  Allergen Reactions   Penicillin G Rash   Plavix [Clopidogrel Bisulfate] Rash    Review of Systems  All other systems reviewed and are negative. Except as noted above.  OBJECTIVE:    Vitals:   06/27/21 1416  BP: (!) 186/111  Pulse: 94  Resp: 16  Temp: 97.9 F (36.6 C)  TempSrc: Oral  SpO2: 96%  Weight: 206 lb (93.4 kg)  Height: 5' (1.524 m)   Body mass index is 40.23 kg/m.  Physical Exam Constitutional:      General: She is not in acute distress.    Appearance: Normal appearance.  HENT:     Head: Normocephalic and atraumatic.     Mouth/Throat:     Comments: Dentition is poor with what looks like several dental caries.  Eyes:     Extraocular Movements: Extraocular movements intact.     Conjunctiva/sclera:  Conjunctivae normal.  Cardiovascular:     Rate and Rhythm: Normal rate and regular rhythm.     Heart sounds: Murmur heard.  Pulmonary:     Effort: Pulmonary effort is normal. No respiratory distress.  Musculoskeletal:     Comments: Upper extremity PICC line in place.   Skin:    General: Skin is warm and dry.  Neurological:     General: No focal deficit present.     Mental Status: She is alert and oriented to person, place, and time.  Psychiatric:        Mood and Affect: Mood normal.        Behavior: Behavior normal.     Labs and Microbiology: CBC Latest Ref Rng & Units 06/11/2021 06/05/2021 06/04/2021  WBC 3.4 - 10.8 x10E3/uL 13.0(H) 12.0(H) 15.3(H)  Hemoglobin 11.1 - 15.9 g/dL 11.9 11.8(L) 12.1  Hematocrit 34.0 - 46.6 % 35.0 36.0 36.8  Platelets 150 - 450 x10E3/uL 448 300 319   CMP Latest Ref Rng & Units 06/11/2021 06/05/2021 06/04/2021  Glucose 70 - 99 mg/dL 133(H) 165(H) 158(H)  BUN 8 - 27 mg/dL _0 Creatinine 0.57 - 1.00 mg/dL 0.74 0.70 0.77  Sodium 134 - 144 mmol/L 140 139 137  Potassium 3.5 - 5.2 mmol/L 4.9 4.3 4.3  Chloride 96 - 106 mmol/L 101 105 102  CO2 20 - 29 mmol/L _1 Calcium 8.7 - 10.3 mg/dL 9.4 8.7(L) 8.6(L)  Total Protein 6.0 - 8.5 g/dL 7.1 - -  Total Bilirubin 0.0 - 1.2 mg/dL 0.3 - -  Alkaline Phos 44 - 121 IU/L 87 - -  AST 0 - 40 IU/L 13 - -  ALT 0 - 32 IU/L 12 - -        ASSESSMENT & PLAN:    Infective endocarditis of aortic valve She has prosthetic valve (TAVR) strep mitis endocarditis and is currently on a prolonged course of IV antibiotics with ceftriaxone.  Currently improved and will complete 6 weeks of therapy on 07/12/21.  Likely source related to her poor dentition and she has been referred to dentistry for consideration of extraction.  Will continue ceftriaxone via PICC line for now and follow up in 2 weeks.  Will also obtain maxillofacial imaging given her presentation and agree with dental evaluation.  Will have her follow up  in 2 weeks prior to completion of antibiotics with me.   Discussed the possibility of suppressive oral antibiotics following IV therapy and will visit this further at follow up.  Her diarrhea has been stable and likely  related to antibiotics but currently no concerning signs/symptoms of C diff.  She will notify us if any changes and would consider testing for C diff if worsens, there are fevers, or new abdominal pain.  She has follow up with cardiology planned in January 2023.   Orders Placed This Encounter  Procedures   CT MAXILLOFACIAL WO CONTRAST    Standing Status:   Future    Standing Expiration Date:   06/27/2022    Order Specific Question:   Preferred imaging location?    Answer:   [facility] 06/27/2021, 2:45 PM

## 2021-06-27 NOTE — Assessment & Plan Note (Signed)
She has prosthetic valve (TAVR) strep mitis endocarditis and is currently on a prolonged course of IV antibiotics with ceftriaxone.  Currently improved and will complete 6 weeks of therapy on 07/12/21.  Likely source related to her poor dentition and she has been referred to dentistry for consideration of extraction.  Will continue ceftriaxone via PICC line for now and follow up in 2 weeks.  Will also obtain maxillofacial imaging given her presentation and agree with dental evaluation.  Will have her follow up in 2 weeks prior to completion of antibiotics with me.   Discussed the possibility of suppressive oral antibiotics following IV therapy and will visit this further at follow up.  Her diarrhea has been stable and likely related to antibiotics but currently no concerning signs/symptoms of C diff.  She will notify us if any changes and would consider testing for C diff if worsens, there are fevers, or new abdominal pain.  She has follow up with cardiology planned in January 2023.

## 2021-06-27 NOTE — Patient Instructions (Signed)
Thank you for coming to see me today. It was a pleasure seeing you.  To Do: Continue Ceftriaxone via PICC line CT scan to look for dental infection Follow up in 2 weeks  If you have any questions or concerns, please do not hesitate to call the office at [phone].  Take Care,   Gwynn Burly

## 2021-06-28 ENCOUNTER — Other Ambulatory Visit: Payer: Self-pay | Admitting: Family Medicine

## 2021-06-29 DIAGNOSIS — J9601 Acute respiratory failure with hypoxia: Secondary | ICD-10-CM | POA: Diagnosis not present

## 2021-06-29 DIAGNOSIS — I330 Acute and subacute infective endocarditis: Secondary | ICD-10-CM | POA: Diagnosis not present

## 2021-06-29 DIAGNOSIS — R6520 Severe sepsis without septic shock: Secondary | ICD-10-CM | POA: Diagnosis not present

## 2021-07-03 ENCOUNTER — Telehealth: Payer: Self-pay | Admitting: Family Medicine

## 2021-07-03 DIAGNOSIS — Z452 Encounter for adjustment and management of vascular access device: Secondary | ICD-10-CM | POA: Diagnosis not present

## 2021-07-03 DIAGNOSIS — R7881 Bacteremia: Secondary | ICD-10-CM | POA: Diagnosis not present

## 2021-07-03 NOTE — Telephone Encounter (Signed)
Spoke with patient to schedule Medicare Annual Wellness Visit (AWV) either virtually or in office.  Patient wanted call back 2023  Last AWV 08/10/17 please schedule at anytime with health coach  This should be a 45 minute visit.

## 2021-07-06 DIAGNOSIS — J9601 Acute respiratory failure with hypoxia: Secondary | ICD-10-CM | POA: Diagnosis not present

## 2021-07-06 DIAGNOSIS — R6520 Severe sepsis without septic shock: Secondary | ICD-10-CM | POA: Diagnosis not present

## 2021-07-06 DIAGNOSIS — I330 Acute and subacute infective endocarditis: Secondary | ICD-10-CM | POA: Diagnosis not present

## 2021-07-08 ENCOUNTER — Ambulatory Visit (HOSPITAL_COMMUNITY)
Admission: RE | Admit: 2021-07-08 | Discharge: 2021-07-08 | Disposition: A | Discharge status: Home / Self Care | Payer: PPO | Source: Ambulatory Visit | Attending: Internal Medicine | Admitting: Internal Medicine

## 2021-07-08 ENCOUNTER — Other Ambulatory Visit: Payer: Self-pay

## 2021-07-08 ENCOUNTER — Encounter (HOSPITAL_COMMUNITY): Payer: Self-pay

## 2021-07-08 DIAGNOSIS — K029 Dental caries, unspecified: Secondary | ICD-10-CM | POA: Diagnosis not present

## 2021-07-08 DIAGNOSIS — B955 Unspecified streptococcus as the cause of diseases classified elsewhere: Secondary | ICD-10-CM | POA: Diagnosis not present

## 2021-07-08 DIAGNOSIS — R7881 Bacteremia: Secondary | ICD-10-CM | POA: Insufficient documentation

## 2021-07-08 DIAGNOSIS — R519 Headache, unspecified: Secondary | ICD-10-CM | POA: Diagnosis not present

## 2021-07-08 DIAGNOSIS — I330 Acute and subacute infective endocarditis: Secondary | ICD-10-CM | POA: Diagnosis not present

## 2021-07-10 DIAGNOSIS — J441 Chronic obstructive pulmonary disease with (acute) exacerbation: Secondary | ICD-10-CM | POA: Diagnosis not present

## 2021-07-10 DIAGNOSIS — Z452 Encounter for adjustment and management of vascular access device: Secondary | ICD-10-CM | POA: Diagnosis not present

## 2021-07-10 DIAGNOSIS — R54 Age-related physical debility: Secondary | ICD-10-CM | POA: Diagnosis not present

## 2021-07-10 DIAGNOSIS — Z87891 Personal history of nicotine dependence: Secondary | ICD-10-CM | POA: Diagnosis not present

## 2021-07-10 DIAGNOSIS — R7881 Bacteremia: Secondary | ICD-10-CM | POA: Diagnosis not present

## 2021-07-10 DIAGNOSIS — E039 Hypothyroidism, unspecified: Secondary | ICD-10-CM | POA: Diagnosis not present

## 2021-07-12 ENCOUNTER — Encounter: Payer: Self-pay | Admitting: Internal Medicine

## 2021-07-12 ENCOUNTER — Telehealth: Payer: Self-pay

## 2021-07-12 ENCOUNTER — Ambulatory Visit (INDEPENDENT_AMBULATORY_CARE_PROVIDER_SITE_OTHER): Payer: PPO | Admitting: Internal Medicine

## 2021-07-12 ENCOUNTER — Other Ambulatory Visit: Payer: Self-pay

## 2021-07-12 VITALS — BP 185/114 | HR 103 | Temp 97.5°F

## 2021-07-12 DIAGNOSIS — K029 Dental caries, unspecified: Secondary | ICD-10-CM

## 2021-07-12 DIAGNOSIS — I330 Acute and subacute infective endocarditis: Secondary | ICD-10-CM

## 2021-07-12 DIAGNOSIS — R7881 Bacteremia: Secondary | ICD-10-CM | POA: Diagnosis not present

## 2021-07-12 DIAGNOSIS — B955 Unspecified streptococcus as the cause of diseases classified elsewhere: Secondary | ICD-10-CM

## 2021-07-12 MED ORDER — AMOXICILLIN 500 MG PO CAPS: 500.0000 mg | ORAL_CAPSULE | Freq: Two times a day (BID) | ORAL | 2 refills | Status: AC

## 2021-07-12 NOTE — Telephone Encounter (Signed)
I spoke with Abbie with [facility] and let her know that patient's picc line was removed in office today. Abbie verbalized understanding. I have also let our pharmacy team know that picc has been removed.  Latacha Texeira T Pricilla Loveless

## 2021-07-12 NOTE — Assessment & Plan Note (Signed)
She has prosthetic valve (TAVR) strep mitis endocarditis and is currently on a prolonged course of Ceftriaxone which will conclude today after 6 weeks of treatment.  Will remove PICC line now.  Discussed suppressive oral antibiotic therapy in the setting of PVE that did not undergo any surgical management.  Recommended that we do suppression to reduce risk of relapse and she agrees.  Will prescribe amoxicillin 500mg  BID.  Of note, she has rash listed to PCN during recent admission but this was never confirmed and she reports having tolerated amoxicillin many times in the past without issues.  Follow up in 3 months.   Her CT maxillofacial was negative for infection and she will follow up with dentistry for her caries.

## 2021-07-12 NOTE — Progress Notes (Signed)
Per verbal order from Dr. Earlene Plater, 37 cm PICC removed from right basilic, tip intact. No sutures present. Chart reflects that 39 cm catheter was placed, 37 cm was removed. Tip was intact and RN notified provider.    Dressing was clean and dry. Insertion site cleaned with CHG. Petroleum dressing applied. Patient advised no heavy lifting with this arm and to leave dressing in place for 24 hours and not to shower affected arm for 24 hours. Advised patient to call office or seek emergency medical care if dressing becomes soaked with blood, swelling, or sharp pain presents. Advised patient to seek emergent care if develops neurological symptoms, chest pain, or shortness of breath. Instructed patient to notify office if they notice redness, warmth, or drainage at the site. Patient verbalized understanding and agreement. RN answered patient's questions. Patient tolerated procedure well and RN walked patient to check out. RN notified Kayla at [facility] of removal.    Sandie Ano, RN

## 2021-07-12 NOTE — Progress Notes (Deleted)
[facility]  CHIEF COMPLAINT:    Follow up for PVE  SUBJECTIVE:    Cathy Moody is a 70 y.o. female with PMHx as below who presents to the clinic for prosthetic valve endocarditis.   Cathy Moody presents today for routine follow up.  I last saw her 06/27/21 for hospital follow up.  Cathy Moody is currently on ceftriaxone 2 gm daily via PICC line.  Cathy Moody completes 6 weeks of therapy today.  Cathy Moody had a CT of her maxillofacial given poor dentition that was negative.  Cathy Moody is doing okay on IV therapy.  No fevers, chills.  Cathy Moody has chronic dyspnea and has experienced some diarrhea likely due to antibiotics but is not particularly worse right now.  Cathy Moody reports it seems to worsen with foods and Cathy Moody also has decreased taste.   Please see A&P for the details of today's visit and status of the patient's medical problems.   Patient's Medications  New Prescriptions   AMOXICILLIN (AMOXIL) 500 MG CAPSULE    Take 1 capsule (500 mg total) by mouth 2 (two) times daily.  Previous Medications   ALBUTEROL (VENTOLIN HFA) 108 (90 BASE) MCG/ACT INHALER    Inhale 2 puffs into the lungs every 6 (six) hours as needed for wheezing or shortness of breath.   ALPRAZOLAM (XANAX) 0.25 MG TABLET    Take 1 tablet (0.25 mg total) by mouth 2 (two) times daily as needed for anxiety.   ASPIRIN EC 81 MG EC TABLET    Take 1 tablet (81 mg total) by mouth daily.   CALAMINE LOTION    Apply topically 2 (two) times daily as needed for itching (twice daily prn).   FLUTICASONE FUROATE-VILANTEROL (BREO ELLIPTA) 100-25 MCG/ACT AEPB    Inhale 1 puff into the lungs daily.   LEVOTHYROXINE (SYNTHROID) 112 MCG TABLET    Take 1 tablet (112 mcg total) by mouth daily before breakfast.   TRIAMCINOLONE CREAM (KENALOG) 0.5 %    Apply topically 2 (two) times daily. Apply over affected area twice daily  Modified Medications   No medications on file  Discontinued Medications   CEFTRIAXONE (ROCEPHIN) 10 G INJECTION        CEFTRIAXONE (ROCEPHIN) IVPB    Inject 2 g into the vein daily. Indication:  Endocarditis  First Dose: No Last Day of Therapy:  07/13/2021 Labs - Once weekly:  CBC/D and BMP, Labs - Every other week:  ESR and CRP Method of administration: IV Push Method of administration may be changed at the discretion of home infusion pharmacist based upon assessment of the patient and/or caregiver's ability to self-administer the medication ordered.      Past Medical History:  Diagnosis Date   Acute CHF ([place])    Archie Endo 07/29/2013   Alcohol abuse    /notes 07/29/2013   Asthma    Hypotension    Morbid obesity ([place])    S/P TAVR (transcatheter aortic valve replacement)     Social History   Tobacco Use   Smoking status: Former    Types: Cigarettes    Quit date: 07/29/2013    Years since quitting: 7.9   Smokeless tobacco: Never  Vaping Use   Vaping Use: Never used  Substance Use Topics   Alcohol use: Yes    Comment: Pt is weekend drinker   Drug use: No    Family History  Problem Relation Age of Onset   Liver disease Son    Diabetes Paternal  Grandmother     Allergies  Allergen Reactions   Penicillin G Rash   Plavix [Clopidogrel Bisulfate] Rash    Review of Systems  Constitutional: Negative.   Respiratory:  Positive for shortness of breath.   Cardiovascular: Negative.   Gastrointestinal:  Positive for diarrhea.    OBJECTIVE:    Vitals:   07/12/21 0929  BP: (!) 185/114  Pulse: (!) 103  Temp: (!) 97.5 F (36.4 C)  TempSrc: Temporal   There is no height or weight on file to calculate BMI.  Physical Exam Constitutional:      General: Cathy Moody is not in acute distress.    Appearance: Normal appearance.  HENT:     Head: Normocephalic and atraumatic.  Pulmonary:     Effort: Pulmonary effort is normal. No respiratory distress.  Musculoskeletal:     Comments: Right UE PICC  Skin:    General: Skin is warm and dry.  Neurological:     General: No focal deficit present.      Mental Status: Cathy Moody is alert and oriented to person, place, and time.     Labs and Microbiology: CBC Latest Ref Rng & Units 06/11/2021 06/05/2021 06/04/2021  WBC 3.4 - 10.8 x10E3/uL 13.0(H) 12.0(H) 15.3(H)  Hemoglobin 11.1 - 15.9 g/dL 11.9 11.8(L) 12.1  Hematocrit 34.0 - 46.6 % 35.0 36.0 36.8  Platelets 150 - 450 x10E3/uL 448 300 319   CMP Latest Ref Rng & Units 06/11/2021 06/05/2021 06/04/2021  Glucose 70 - 99 mg/dL 133(H) 165(H) 158(H)  BUN 8 - 27 mg/dL 9 12 12   Creatinine 0.57 - 1.00 mg/dL 0.74 0.70 0.77  Sodium 134 - 144 mmol/L 140 139 137  Potassium 3.5 - 5.2 mmol/L 4.9 4.3 4.3  Chloride 96 - 106 mmol/L 101 105 102  CO2 20 - 29 mmol/L 24 29 26   Calcium 8.7 - 10.3 mg/dL 9.4 8.7(L) 8.6(L)  Total Protein 6.0 - 8.5 g/dL 7.1 - -  Total Bilirubin 0.0 - 1.2 mg/dL 0.3 - -  Alkaline Phos 44 - 121 IU/L 87 - -  AST 0 - 40 IU/L 13 - -  ALT 0 - 32 IU/L 12 - -     No results found for this or any previous visit (from the past 240 hour(s)).  Imaging: EXAM: CT MAXILLOFACIAL WITHOUT CONTRAST   TECHNIQUE: Multidetector CT imaging of the maxillofacial structures was performed. Multiplanar CT image reconstructions were also generated.   COMPARISON:  None.   FINDINGS: Osseous: No fracture or mandibular dislocation. No destructive process. No significant periapical lucencies associated with teeth.   Orbits: The globes and orbits are within normal limits.   Sinuses: The paranasal sinuses and mastoid air cells are clear.   Soft tissues: Negative.   Limited intracranial: Within normal limits.   IMPRESSION: Negative CT of the face.   ASSESSMENT & PLAN:    Infective endocarditis of aortic valve Cathy Moody has prosthetic valve (TAVR) strep mitis endocarditis and is currently on a prolonged course of Ceftriaxone which will conclude today after 6 weeks of treatment.  Will remove PICC line now.  Discussed suppressive oral antibiotic therapy in the setting of PVE that did not undergo any  surgical management.  Recommended that we do suppression to reduce risk of relapse and Cathy Moody agrees.  Will prescribe amoxicillin 500mg  BID.  Of note, Cathy Moody has rash listed to PCN during recent admission but this was never confirmed and Cathy Moody reports having tolerated amoxicillin many times in the past without issues.  Follow up in 3  months.   Her CT maxillofacial was negative for infection and Cathy Moody will follow up with dentistry for her caries.        [facility] 07/12/2021, 10:03 AM

## 2021-07-12 NOTE — Patient Instructions (Signed)
Thank you for coming to see me today. It was a pleasure seeing you.  To Do: PICC line removed today and stop Ceftriaxone Start taking amoxicillin 500mg  twice daily and I sent prescription Follow up in 3 months, but let know if any issues prior to that.  If you have any questions or concerns, please do not hesitate to call the office at [phone].  Take Care,   [phone], DO

## 2021-07-12 NOTE — Progress Notes (Signed)
[facility]   CHIEF COMPLAINT:     Follow up for PVE   SUBJECTIVE:     Cathy Moody is a 70 y.o. female with PMHx as below who presents to the clinic for prosthetic valve endocarditis.    She presents today for routine follow up.  I last saw her 06/27/21 for hospital follow up.  She is currently on ceftriaxone 2 gm daily via PICC line.  She completes 6 weeks of therapy today.  She had a CT of her maxillofacial given poor dentition that was negative.  She is doing okay on IV therapy.  No fevers, chills.  She has chronic dyspnea and has experienced some diarrhea likely due to antibiotics but is not particularly worse right now.  She reports it seems to worsen with foods and she also has decreased taste.    Please see A&P for the details of today's visit and status of the patient's medical problems.        Patient's Medications  New Prescriptions    AMOXICILLIN (AMOXIL) 500 MG CAPSULE    Take 1 capsule (500 mg total) by mouth 2 (two) times daily.  Previous Medications    ALBUTEROL (VENTOLIN HFA) 108 (90 BASE) MCG/ACT INHALER    Inhale 2 puffs into the lungs every 6 (six) hours as needed for wheezing or shortness of breath.    ALPRAZOLAM (XANAX) 0.25 MG TABLET    Take 1 tablet (0.25 mg total) by mouth 2 (two) times daily as needed for anxiety.    ASPIRIN EC 81 MG EC TABLET    Take 1 tablet (81 mg total) by mouth daily.    CALAMINE LOTION    Apply topically 2 (two) times daily as needed for itching (twice daily prn).    FLUTICASONE FUROATE-VILANTEROL (BREO ELLIPTA) 100-25 MCG/ACT AEPB    Inhale 1 puff into the lungs daily.    LEVOTHYROXINE (SYNTHROID) 112 MCG TABLET    Take 1 tablet (112 mcg total) by mouth daily before breakfast.    TRIAMCINOLONE CREAM (KENALOG) 0.5 %    Apply topically 2 (two) times daily. Apply over affected area twice daily  Modified Medications    No medications on file  Discontinued Medications    CEFTRIAXONE (ROCEPHIN) 10 G  INJECTION        CEFTRIAXONE (ROCEPHIN) IVPB    Inject 2 g into the vein daily. Indication:  Endocarditis  First Dose: No Last Day of Therapy:  07/13/2021 Labs - Once weekly:  CBC/D and BMP, Labs - Every other week:  ESR and CRP Method of administration: IV Push Method of administration may be changed at the discretion of home infusion pharmacist based upon assessment of the patient and/or caregiver's ability to self-administer the medication ordered.           Past Medical History:  Diagnosis Date   Acute CHF ([place])      Archie Endo 07/29/2013   Alcohol abuse      /notes 07/29/2013   Asthma     Hypotension     Morbid obesity ([place])     S/P TAVR (transcatheter aortic valve replacement)        Social History         Tobacco Use   Smoking status: Former      Types: Cigarettes      Quit date: 07/29/2013      Years since quitting: 7.9   Smokeless tobacco: Never  Vaping Use   Vaping Use: Never used  Substance Use Topics   Alcohol use: Yes      Comment: Pt is weekend drinker   Drug use: No           Family History  Problem Relation Age of Onset   Liver disease Son     Diabetes Paternal Grandmother            Allergies  Allergen Reactions   Penicillin G Rash   Plavix [Clopidogrel Bisulfate] Rash      Review of Systems  Constitutional: Negative.   Respiratory:  Positive for shortness of breath.   Cardiovascular: Negative.   Gastrointestinal:  Positive for diarrhea.      OBJECTIVE:        Vitals:    07/12/21 0929  BP: (!) 185/114  Pulse: (!) 103  Temp: (!) 97.5 F (36.4 C)  TempSrc: Temporal    There is no height or weight on file to calculate BMI.   Physical Exam Constitutional:      General: She is not in acute distress.    Appearance: Normal appearance.  HENT:     Head: Normocephalic and atraumatic.  Pulmonary:     Effort: Pulmonary effort is normal. No respiratory distress.  Musculoskeletal:     Comments: Right UE PICC  Skin:    General: Skin is  warm and dry.  Neurological:     General: No focal deficit present.     Mental Status: She is alert and oriented to person, place, and time.        Labs and Microbiology: CBC Latest Ref Rng & Units 06/11/2021 06/05/2021 06/04/2021  WBC 3.4 - 10.8 x10E3/uL 13.0(H) 12.0(H) 15.3(H)  Hemoglobin 11.1 - 15.9 g/dL 11.9 11.8(L) 12.1  Hematocrit 34.0 - 46.6 % 35.0 36.0 36.8  Platelets 150 - 450 x10E3/uL 448 300 319    CMP Latest Ref Rng & Units 06/11/2021 06/05/2021 06/04/2021  Glucose 70 - 99 mg/dL 133(H) 165(H) 158(H)  BUN 8 - 27 mg/dL _0 Creatinine 0.57 - 1.00 mg/dL 0.74 0.70 0.77  Sodium 134 - 144 mmol/L 140 139 137  Potassium 3.5 - 5.2 mmol/L 4.9 4.3 4.3  Chloride 96 - 106 mmol/L 101 105 102  CO2 20 - 29 mmol/L _1 Calcium 8.7 - 10.3 mg/dL 9.4 8.7(L) 8.6(L)  Total Protein 6.0 - 8.5 g/dL 7.1 - -  Total Bilirubin 0.0 - 1.2 mg/dL 0.3 - -  Alkaline Phos 44 - 121 IU/L 87 - -  AST 0 - 40 IU/L 13 - -  ALT 0 - 32 IU/L 12 - -      No results found for this or any previous visit (from the past 240 hour(s)).   Imaging: EXAM: CT MAXILLOFACIAL WITHOUT CONTRAST   TECHNIQUE: Multidetector CT imaging of the maxillofacial structures was performed. Multiplanar CT image reconstructions were also generated.   COMPARISON:  None.   FINDINGS: Osseous: No fracture or mandibular dislocation. No destructive process. No significant periapical lucencies associated with teeth.   Orbits: The globes and orbits are within normal limits.   Sinuses: The paranasal sinuses and mastoid air cells are clear.   Soft tissues: Negative.   Limited intracranial: Within normal limits.   IMPRESSION: Negative CT of the face.     ASSESSMENT & PLAN:     Infective endocarditis of aortic valve She has prosthetic valve (TAVR) strep mitis endocarditis and is currently on a prolonged course of Ceftriaxone which  will conclude today after 6 weeks of treatment.  Will remove PICC line now.  Discussed  suppressive oral antibiotic therapy in the setting of PVE that did not undergo any surgical management.  Recommended that we do suppression to reduce risk of relapse and she agrees.  Will prescribe amoxicillin 525m BID.  Of note, she has rash listed to PCN during recent admission but this was never confirmed and she reports having tolerated amoxicillin many times in the past without issues.  Follow up in 3 months.   Her CT maxillofacial was negative for infection and she will follow up with dentistry for her caries.

## 2021-07-21 ENCOUNTER — Other Ambulatory Visit: Payer: Self-pay | Admitting: Family Medicine

## 2021-07-21 DIAGNOSIS — J4540 Moderate persistent asthma, uncomplicated: Secondary | ICD-10-CM

## 2021-07-22 ENCOUNTER — Ambulatory Visit: Payer: PPO | Admitting: Cardiovascular Disease

## 2021-07-22 NOTE — Telephone Encounter (Signed)
[facility] is requesting to fill pt albuterol inhaler . Please advise K[facility] 

## 2021-08-13 ENCOUNTER — Other Ambulatory Visit: Payer: Self-pay | Admitting: Family Medicine

## 2021-08-13 DIAGNOSIS — J4540 Moderate persistent asthma, uncomplicated: Secondary | ICD-10-CM

## 2021-08-13 NOTE — Telephone Encounter (Signed)
[facility] is requesting to fill pt albuterol inhaler . Please advise K[facility] 

## 2021-08-30 ENCOUNTER — Telehealth (HOSPITAL_COMMUNITY): Payer: Self-pay

## 2021-09-02 ENCOUNTER — Other Ambulatory Visit: Payer: Self-pay

## 2021-09-02 ENCOUNTER — Ambulatory Visit (HOSPITAL_COMMUNITY): Payer: PPO | Attending: Internal Medicine

## 2021-09-02 DIAGNOSIS — Z952 Presence of prosthetic heart valve: Secondary | ICD-10-CM | POA: Diagnosis not present

## 2021-09-02 LAB — ECHOCARDIOGRAM COMPLETE
AR max vel: 0.59 cm2
AV Area VTI: 0.57 cm2
AV Area mean vel: 0.53 cm2
AV Mean grad: 36 mmHg
AV Peak grad: 67.2 mmHg
Ao pk vel: 4.1 m/s
Area-P 1/2: 4.58 cm2
MV M vel: 6.52 m/s
MV Peak grad: 170 mmHg
MV VTI: 1.35 cm2
S' Lateral: 3.1 cm

## 2021-09-04 ENCOUNTER — Encounter: Payer: Self-pay | Admitting: Cardiovascular Disease

## 2021-09-04 ENCOUNTER — Ambulatory Visit (INDEPENDENT_AMBULATORY_CARE_PROVIDER_SITE_OTHER): Payer: PPO | Admitting: Cardiovascular Disease

## 2021-09-04 ENCOUNTER — Other Ambulatory Visit: Payer: Self-pay | Admitting: Physician Assistant

## 2021-09-04 ENCOUNTER — Encounter: Payer: Self-pay | Admitting: Physician Assistant

## 2021-09-04 ENCOUNTER — Other Ambulatory Visit: Payer: Self-pay

## 2021-09-04 VITALS — BP 150/100 | HR 106 | Ht 60.0 in | Wt 187.0 lb

## 2021-09-04 DIAGNOSIS — T8209XS Other mechanical complication of heart valve prosthesis, sequela: Secondary | ICD-10-CM | POA: Diagnosis not present

## 2021-09-04 DIAGNOSIS — I330 Acute and subacute infective endocarditis: Secondary | ICD-10-CM

## 2021-09-04 LAB — BASIC METABOLIC PANEL
BUN/Creatinine Ratio: 10 — ABNORMAL LOW (ref 12–28)
BUN: 10 mg/dL (ref 8–27)
CO2: 25 mmol/L (ref 20–29)
Calcium: 9.8 mg/dL (ref 8.7–10.3)
Chloride: 98 mmol/L (ref 96–106)
Creatinine, Ser: 0.96 mg/dL (ref 0.57–1.00)
Glucose: 127 mg/dL — ABNORMAL HIGH (ref 70–99)
Potassium: 4.2 mmol/L (ref 3.5–5.2)
Sodium: 136 mmol/L (ref 134–144)
eGFR: 64 mL/min/{1.73_m2} (ref >59)

## 2021-09-04 MED ORDER — METOPROLOL TARTRATE 25 MG PO TABS: 50.0000 mg | ORAL_TABLET | ORAL | 0 refills | Status: AC

## 2021-09-04 NOTE — Patient Instructions (Addendum)
Medication Instructions:  Your physician recommends that you continue on your current medications as directed. Please refer to the Current Medication list given to you today.  *If you need a refill on your cardiac medications before your next appointment, please call your pharmacy*  Lab Work: Your physician recommends that you have lab work today BMET  If you have labs (blood work) drawn today and your tests are completely normal, you will receive your results only by: MyChart Message (if you have MyChart) OR A paper copy in the mail If you have any lab test that is abnormal or we need to change your treatment, we will call you to review the results.  Testing/Procedures: Cardiac CT scanning, (CAT scanning), is a noninvasive, special x-ray that produces cross-sectional images of the body using x-rays and a computer. CT scans help physicians diagnose and treat medical conditions. For some CT exams, a contrast material is used to enhance visibility in the area of the body being studied. CT scans provide greater clarity and reveal more details than regular x-ray exams.  Follow-Up: At [facility], you and your health needs are our priority.  As part of our continuing mission to provide you with exceptional heart care, we have created designated Provider Care Teams.  These Care Teams include your primary Cardiologist (physician) and Advanced Practice Providers (APPs -  Physician Assistants and Nurse Practitioners) who all work together to provide you with the care you need, when you need it.  We recommend signing up for the patient portal called "MyChart".  Sign up information is provided on this After Visit Summary.  MyChart is used to connect with patients for Virtual Visits (Telemedicine).  Patients are able to view lab/test results, encounter notes, upcoming appointments, etc.  Non-urgent messages can be sent to your provider as well.   To learn more about what you can do with MyChart, go to  [email or web address].    Your next appointment:   3 month(s)  The format for your next appointment:   In Person  Provider:   Dr. Tonny Bollman   Please check your Blood Pressures daily. Give our office a call if your Blood Pressure is continuously above 140/90.

## 2021-09-04 NOTE — Progress Notes (Signed)
Pre Surgical Assessment: 5 M Walk Test  18M=16.31ft  5 Meter Walk Test- trial 1: 4.55 seconds 5 Meter Walk Test- trial 2: 5.53 seconds 5 Meter Walk Test- trial 3: 4.60 seconds 5 Meter Walk Test Average: 4.89 seconds

## 2021-09-04 NOTE — Progress Notes (Addendum)
Cardiology Office Note:    Date:  09/04/2021   ID:  Cathy Moody, DOB 28-Aug-1950, MRN [id number]  PCP:  Denita Lung, MD   [place] Providers Cardiologist:  Dorris Carnes, MD     Referring MD: Denita Lung, MD   No chief complaint on file.   History of Present Illness:    Cathy Moody is a 71 y.o. female presenting for follow-up of prosthetic aortic valve endocarditis.  The patient has a complex medical history.  She was found to be critically ill and 2014 with critical aortic stenosis, severely reduced LV function with LVEF 25%, severe COPD, and pulmonary hypertension.  The patient was ultimately treated with TAVR via a transapical approach using a 29 mm Edwards SAPIEN valve.  LVEF quickly normalized and the patient did well with an uncomplicated hospital course.  The patient was subsequently lost to follow-up after 2015.  She then was hospitalized in October 2022 for Streptococcus mitis bacteremia and found to have aortic valve endocarditis with critical aortic stenosis present.  The patient had a mean transaortic gradient measured as high as 93 mmHg with calculated aortic valve area of 0.5 cm.  TEE demonstrated restricted mobility of her aortic valve leaflets with mobile echodensities consistent with infective vegetations.  The patient was treated with IV penicillin initially and then required a change to Rocephin because of drug rash.  She completed 6 weeks of therapy.  The patient is here alone today.  She has had a good recovery from her hospitalization last year.  She complains of fatigue and leg weakness with walking.  She is also had some nausea that she associates with the use of amoxicillin long-term.  She has no change in her shortness of breath.  She previously had orthopnea, PND, and leg swelling prior to undergoing TAVR in 2014.  None of the symptoms have returned.  She denies chest pain or pressure.  She has had no recent fevers or chills.  Past Medical History:   Diagnosis Date   Acute CHF ([place])    Archie Endo 07/29/2013   Alcohol abuse    /notes 07/29/2013   Asthma    Hypotension    Morbid obesity ([place])    S/P TAVR (transcatheter aortic valve replacement)     Past Surgical History:  Procedure Laterality Date   INTRAOPERATIVE TRANSESOPHAGEAL ECHOCARDIOGRAM N/A 08/09/2013   Procedure: INTRAOPERATIVE TRANSESOPHAGEAL ECHOCARDIOGRAM;  Surgeon: Gaye Pollack, MD;  Location: [facility];  Service: Open Heart Surgery;  Laterality: N/A;   LEFT AND RIGHT HEART CATHETERIZATION WITH CORONARY ANGIOGRAM N/A 08/01/2013   Procedure: LEFT AND RIGHT HEART CATHETERIZATION WITH CORONARY ANGIOGRAM;  Surgeon: Sinclair Grooms, MD;  Location: [facility] CATH LAB;  Service: Cardiovascular;  Laterality: N/A;   TEE WITHOUT CARDIOVERSION N/A 05/31/2021   Procedure: TRANSESOPHAGEAL ECHOCARDIOGRAM (TEE);  Surgeon: Berniece Salines, DO;  Location: [facility] END[facility];  Service: Cardiovascular;  Laterality: N/A;   TRANSCATHETER AORTIC VALVE REPLACEMENT, TRANSAPICAL Left 08/09/2013   Procedure: TRANSCATHETER AORTIC VALVE REPLACEMENT, TRANSAPICAL;  Surgeon: Gaye Pollack, MD;  Location: [facility];  Service: Open Heart Surgery;  Laterality: Left;    Current Medications: Current Meds  Medication Sig   albuterol (VENTOLIN HFA) 108 (90 Base) MCG/ACT inhaler TAKE 2 PUFFS BY MOUTH EVERY 6 HOURS AS NEEDED FOR WHEEZE OR SHORTNESS OF BREATH   ALPRAZolam (XANAX) 0.25 MG tablet Take 1 tablet (0.25 mg total) by mouth 2 (two) times daily as needed for anxiety.   amoxicillin (AMOXIL) 500 MG capsule  Take 1 capsule (500 mg total) by mouth 2 (two) times daily.   aspirin EC 81 MG EC tablet Take 1 tablet (81 mg total) by mouth daily.   fluticasone furoate-vilanterol (BREO ELLIPTA) 100-25 MCG/ACT AEPB Inhale 1 puff into the lungs daily.   levothyroxine (SYNTHROID) 112 MCG tablet Take 1 tablet (112 mcg total) by mouth daily before breakfast.     Allergies:   Penicillin g and Plavix [clopidogrel bisulfate]   Social History    Socioeconomic History   Marital status: Divorced    Spouse name: Not on file   Number of children: Not on file   Years of education: Not on file   Highest education level: Not on file  Occupational History   Not on file  Tobacco Use   Smoking status: Former    Types: Cigarettes    Quit date: 07/29/2013    Years since quitting: 8.1   Smokeless tobacco: Never  Vaping Use   Vaping Use: Never used  Substance and Sexual Activity   Alcohol use: Yes    Comment: Pt is weekend drinker   Drug use: No   Sexual activity: Not Currently  Other Topics Concern   Not on file  Social History Narrative   Not on file   Social Determinants of Health   Financial Resource Strain: Not on file  Food Insecurity: Not on file  Transportation Needs: Not on file  Physical Activity: Not on file  Stress: Not on file  Social Connections: Not on file     Family History: The patient's family history includes Diabetes in her paternal grandmother; Liver disease in her son.  ROS:   Please see the history of present illness.    All other systems reviewed and are negative.  EKGs/Labs/Other Studies Reviewed:    The following studies were reviewed today: Echo 05/29/21: 1. Left ventricular ejection fraction, by estimation, is 60 to 65%. The  left ventricle has normal function. The left ventricle has no regional  wall motion abnormalities. There is moderate left ventricular hypertrophy.  Left ventricular diastolic  parameters are consistent with Grade I diastolic dysfunction (impaired  relaxation). Elevated left ventricular end-diastolic pressure. The E/e' is  41.   2. Right ventricular systolic function is low normal. The right  ventricular size is normal. There is normal pulmonary artery systolic  pressure. The estimated right ventricular systolic pressure is [id number] mmHg.   3. Left atrial size was moderately dilated.   4. Right atrial size was mildly dilated.   5. The mitral valve is abnormal.  Trivial mitral valve regurgitation.  Moderate mitral annular calcification.   6. Prostetic valve EOA is 0.46 cm2 - the AT is prolonged at 123 msec, jet  contour is rounded, DI is 0.15 -features consistent with severe prosthetic  valve stenosis. The aortic valve has been repaired/replaced. Aortic valve  regurgitation is not  visualized. Critical aortic stenosis. There is a 29 mm Sapien prosthetic  (TAVR) valve present in the aortic position. Procedure Date: 08/09/13.  Echo findings are consistent with stenosis and probable vegetation of the  aortic prosthesis. Aortic valve  area, by VTI measures 0.46 cm. Aortic valve mean gradient measures 93.2  mmHg. Aortic valve Vmax measures 5.82 m/s.   7. The inferior vena cava is normal in size with greater than 50%  respiratory variability, suggesting right atrial pressure of 3 mmHg.   Comparison(s): Changes from prior study are noted. 08/09/2014: LVEF  55-60%, s/p TAVR with mean gradient 8 mmHg.  Conclusion(s)/Recommendation(s): Findings concerning for aortic valve  vegetation of the bioprothesis, would recommend a Transesophageal  Echocardiogram for clarification. Due to the above findings, cardiology  consult is recommended. Critical findings  reported to Dr. Loleta Books and acknowledged at 2:30 pm.   LEFT ATRIUM             Index       RIGHT ATRIUM           Index  LA diam:        4.30 cm 2.22 cm/m  RA Area:     21.70 cm  LA Vol (A2C):   72.5 ml 37.49 ml/m RA Volume:   67.60 ml  34.96 ml/m  LA Vol (A4C):   75.8 ml 39.20 ml/m  LA Biplane Vol: 74.4 ml 38.47 ml/m   AORTIC VALVE  AV Area (Vmax):    0.52 cm  AV Area (Vmean):   0.46 cm  AV Area (VTI):     0.46 cm  AV Vmax:           582.40 cm/s  AV Vmean:          463.200 cm/s  AV VTI:            1.480 m  AV Peak Grad:      135.7 mmHg  AV Mean Grad:      93.2 mmHg  LVOT Vmax:         95.60 cm/s  LVOT Vmean:        67.400 cm/s  LVOT VTI:          0.217 m  LVOT/AV VTI ratio: 0.15    TEE: 1. Left ventricular ejection fraction, by estimation, is 60 to 65%. The  left ventricle has normal function. The left ventricle has no regional  wall motion abnormalities.   2. Right ventricular systolic function is normal. The right ventricular  size is normal.   3. The mitral valve is normal in structure. Mild to moderate mitral valve  regurgitation. No evidence of mitral stenosis.   4. There are two seperate highly mobile/oscillating masses appreciated on  the aortic bioprosthesis highly suspected to be a vegetation. Aortic valve  regurgitation is mild.   5. Severe aortic valve stenosis. There is a Edwards bioprosthetic valve  present in the aortic position. Aortic valve area, by VTI measures 0.43  cm. Aortic valve mean gradient measures 64.0 mmHg. Aortic valve Vmax  measures 4.95 m/s.   6. The inferior vena cava is normal in size with greater than 50%  respiratory variability, suggesting right atrial pressure of 3 mmHg.   7. No left atrial/left atrial appendage thrombus was detected.   Conclusion(s)/Recommendation(s): Findings are concerning for  vegetation/infective endocarditis as detailed above. May need Cardiac CTA  for better visualization of the aortic annulus.   Repeat Echo 09/02/21: IMPRESSIONS     1. The aortic valve has been replaced with a 29 mm Sapien valve. Aortic  valve regurgitation is not visualized. Effective orifice area, by VTI  measures 0.57 cm. Aortic valve mean gradient measures 36.0 mmHg. Aortic  valve Vmax measures 4.10 m/s. Aortic  valve acceleration time measures 102 msec. Echodensites on aortic valve  are present. Suggestive of critical aortic stenosis.   2. Left ventricular ejection fraction, by estimation, is 60 to 65%. The  left ventricle has normal function. The left ventricle has no regional  wall motion abnormalities. There is mild concentric left ventricular  hypertrophy. Left ventricular diastolic  parameters are indeterminate.  3. Right ventricular systolic function is normal. The right ventricular  size is normal. Tricuspid regurgitation signal is inadequate for assessing  PA pressure.   4. Left atrial size was severely dilated.   5. The mitral valve is degenerative. Trivial mitral valve regurgitation.  No evidence of mitral stenosis. Moderate mitral annular calcification.   Comparison(s): No significant improvement in aortic stenosis or  echodensities/vegetations.   FINDINGS   Left Ventricle: Left ventricular ejection fraction, by estimation, is 60  to 65%. The left ventricle has normal function. The left ventricle has no  regional wall motion abnormalities. The left ventricular internal cavity  size was normal in size. There is   mild concentric left ventricular hypertrophy. Left ventricular diastolic  parameters are indeterminate.   Right Ventricle: The right ventricular size is normal. No increase in  right ventricular wall thickness. Right ventricular systolic function is  normal. Tricuspid regurgitation signal is inadequate for assessing PA  pressure.   Left Atrium: Left atrial size was severely dilated.   Right Atrium: Right atrial size was normal in size.   Pericardium: There is no evidence of pericardial effusion.   Mitral Valve: The mitral valve is degenerative in appearance. Moderate  mitral annular calcification. Trivial mitral valve regurgitation. No  evidence of mitral valve stenosis. MV peak gradient, 16.0 mmHg. The mean  mitral valve gradient is 8.3 mmHg.   Tricuspid Valve: The tricuspid valve is normal in structure. Tricuspid  valve regurgitation is not demonstrated. No evidence of tricuspid  stenosis.   Aortic Valve: The aortic valve has been repaired/replaced. Aortic valve  regurgitation is not visualized. Aortic valve mean gradient measures 36.0  mmHg. Aortic valve peak gradient measures 67.2 mmHg. Aortic valve area, by  VTI measures 0.57 cm.   Pulmonic Valve: The pulmonic  valve was not well visualized. Pulmonic valve  regurgitation is not visualized. No evidence of pulmonic stenosis.   Aorta: The aortic root is normal in size and structure.   IAS/Shunts: No atrial level shunt detected by color flow Doppler.   EKG:  EKG is not ordered today.    Recent Labs: 05/29/2021: TSH 10.760 06/05/2021: Magnesium 2.0 06/11/2021: ALT 12; BUN 9; Creatinine, Ser 0.74; Hemoglobin 11.9; Platelets 448; Potassium 4.9; Sodium 140  Recent Lipid Panel    Component Value Date/Time   CHOL 228 (H) 08/10/2017 1556   TRIG 71 08/10/2017 1556   HDL 70 08/10/2017 1556   CHOLHDL 3.3 08/10/2017 1556   VLDL 25 02/28/2016 1449   LDLCALC 141 (H) 08/10/2017 1556     Risk Assessment/Calculations:          Physical Exam:    VS:  BP (!) 150/100    Pulse (!) 106    Ht 5' (1.524 m)    Wt 187 lb (84.8 kg)    SpO2 94%    BMI 36.52 kg/m     Wt Readings from Last 3 Encounters:  09/04/21 187 lb (84.8 kg)  06/27/21 206 lb (93.4 kg)  06/13/21 211 lb 9.6 oz (96 kg)     GEN:  Well nourished, well developed in no acute distress HEENT: Normal NECK: No JVD; No carotid bruits LYMPHATICS: No lymphadenopathy CARDIAC: RRR, 2/6 systolic murmur at the right upper sternal border, no diastolic murmur. RESPIRATORY:  Clear to auscultation without rales, wheezing or rhonchi  ABDOMEN: Soft, non-tender, non-distended MUSCULOSKELETAL:  No edema; No deformity  SKIN: Warm and dry NEUROLOGIC:  Alert and oriented x 3 PSYCHIATRIC:  Normal affect   _____________________  STS  score Procedure: Isolated AVR Risk of Mortality: 2.959% Renal Failure: 2.609% Permanent Stroke: 0.872% Prolonged Ventilation: 9.147% DSW Infection: 0.370% Reoperation: 4.263% Morbidity or Mortality: 13.250% Short Length of Stay: 33.548% Long Length of Stay: 6.062%  ASSESSMENT:    1. Prosthetic valve dysfunction, sequela   2. Infective endocarditis of aortic valve    PLAN:    In order of problems listed  above:  The patient has severe prosthetic aortic stenosis with NYHA functional class II symptoms.  She is now about 3 months out from the diagnosis of streptococcal mitis bacteremia and infective endocarditis.  Her echo studies are reviewed dating back to her diagnosis of endocarditis May 29, 2021.  I personally reviewed her TEE images from May 31, 2021.  She had a follow-up echocardiogram this week.  Well her transvalvular gradients have improved dramatically with mean gradient dropping from 93 mmHg to 36 mmHg, she continues to have echo evidence of severe prosthetic valve stenosis.  Her current echo study demonstrates continued severe thickening, restriction, and stenosis of the prosthetic aortic valve leaflets.  Her dimensionless index and calculated valve area both remain very low and consistent with severe aortic stenosis.  Fortunately, she is experiencing only very mild symptoms and she has demonstrated significant improvement from her study 3 months ago.  Treatment options are likely quite limited at this point.  I am almost certain she would not be a candidate for high risk cardiac surgery based on her functional capacity and presence of severe chronic obstructive pulmonary disease.  I think a gated cardiac CTA to evaluate the TAVR bioprosthesis for leaflet degeneration, continued evidence of vegetation, and the possibility of subacute leaflet thrombosis, would be helpful and directing her care.  If there is evidence of subacute leaflet thrombosis, we could try her on a course of oral anticoagulation to see if her gradients further improve.  She has an upcoming dental appointment for consideration of extraction of her remaining teeth.  After her CTA is completed, we will review her case with our multidisciplinary heart valve team to discuss treatment options.  I suspect valve in valve TAVR will not be an option because of prohibitive risk of reinfection, but this is something we will discuss.  The  patient will be contacting Dr. Juleen China for infectious disease follow-up to further review whether she should remain on long-term suppressive oral antibiotics.          Medication Adjustments/Labs and Tests Ordered: Current medicines are reviewed at length with the patient today.  Concerns regarding medicines are outlined above.  No orders of the defined types were placed in this encounter.  No orders of the defined types were placed in this encounter.   Patient Instructions  Medication Instructions:  Your physician recommends that you continue on your current medications as directed. Please refer to the Current Medication list given to you today.  *If you need a refill on your cardiac medications before your next appointment, please call your pharmacy*  Lab Work: Your physician recommends that you return for lab work in: BMET  If you have labs (blood work) drawn today and your tests are completely normal, you will receive your results only by: MyChart Message (if you have MyChart) OR A paper copy in the mail If you have any lab test that is abnormal or we need to change your treatment, we will call you to review the results.  Testing/Procedures: Cardiac CT scanning, (CAT scanning), is a noninvasive, special x-ray that produces cross-sectional images of the  body using x-rays and a computer. CT scans help physicians diagnose and treat medical conditions. For some CT exams, a contrast material is used to enhance visibility in the area of the body being studied. CT scans provide greater clarity and reveal more details than regular x-ray exams.  Follow-Up: At [facility], you and your health needs are our priority.  As part of our continuing mission to provide you with exceptional heart care, we have created designated Provider Care Teams.  These Care Teams include your primary Cardiologist (physician) and Advanced Practice Providers (APPs -  Physician Assistants and Nurse Practitioners)  who all work together to provide you with the care you need, when you need it.  We recommend signing up for the patient portal called "MyChart".  Sign up information is provided on this After Visit Summary.  MyChart is used to connect with patients for Virtual Visits (Telemedicine).  Patients are able to view lab/test results, encounter notes, upcoming appointments, etc.  Non-urgent messages can be sent to your provider as well.   To learn more about what you can do with MyChart, go to [email or web address].    Your next appointment:   3 month(s)  The format for your next appointment:   In Person  Provider:   Dr. Sherren Mocha  Other Instructions   Your cardiac CT will be scheduled at one of the below locations:   [facility] [address] [place], [place] [address] [phone]  [place] [address] [place], [place] [address] [phone]  If scheduled at [facility], please arrive at the [facility] main entrance (entrance A) of [facility] 30 minutes prior to test start time. You can use the FREE valet parking offered at the main entrance (encouraged to control the heart rate for the test) Proceed to the [facility] Radiology Department (first floor) to check-in and test prep.  If scheduled at [facility], please arrive 15 mins early for check-in and test prep.  Please follow these instructions carefully (unless otherwise directed):  Hold all erectile dysfunction medications at least 3 days (72 hrs) prior to test.  On the Night Before the Test: Be sure to Drink plenty of water. Do not consume any caffeinated/decaffeinated beverages or chocolate 12 hours prior to your test. Do not take any antihistamines 12 hours prior to your test. On the Day of the Test: Drink plenty of water until 1 hour prior to the test. Do not eat any food 4 hours prior to the  test. You may take your regular medications prior to the test.  Take metoprolol (Lopressor) 100 mg two hours prior to test. HOLD Furosemide/Hydrochlorothiazide morning of the test. FEMALES- please wear underwire-free bra if available, avoid dresses & tight clothing After the Test: Drink plenty of water. After receiving IV contrast, you may experience a mild flushed feeling. This is normal. On occasion, you may experience a mild rash up to 24 hours after the test. This is not dangerous. If this occurs, you can take Benadryl 25 mg and increase your fluid intake. If you experience trouble breathing, this can be serious. If it is severe call 911 IMMEDIATELY. If it is mild, please call our office. If you take any of these medications: Glipizide/Metformin, Avandament, Glucavance, please do not take 48 hours after completing test unless otherwise instructed.  Please allow 2-4 weeks for scheduling of routine cardiac CTs. Some insurance companies require a pre-authorization which  may delay scheduling of this test.   For non-scheduling related questions, please contact the cardiac imaging nurse navigator should you have any questions/concerns: Marchia Bond, Cardiac Imaging Nurse Navigator Gordy Clement, Cardiac Imaging Nurse Navigator [facility] Direct Office Dial: [phone]   For scheduling needs, including cancellations and rescheduling, please call [place], [phone].    Signed, Sherren Mocha, MD  09/04/2021 10:51 AM    [facility] [facility]

## 2021-09-05 ENCOUNTER — Other Ambulatory Visit: Payer: Self-pay | Admitting: Family Medicine

## 2021-09-05 DIAGNOSIS — J4540 Moderate persistent asthma, uncomplicated: Secondary | ICD-10-CM

## 2021-09-05 NOTE — Telephone Encounter (Signed)
[facility] is requesting to fill pt albuterol inhaler . Please advise K[facility] 

## 2021-09-12 ENCOUNTER — Ambulatory Visit (HOSPITAL_COMMUNITY)
Admission: RE | Admit: 2021-09-12 | Discharge: 2021-09-12 | Disposition: A | Discharge status: Home / Self Care | Payer: PPO | Source: Ambulatory Visit | Attending: Physician Assistant | Admitting: Physician Assistant

## 2021-09-12 ENCOUNTER — Other Ambulatory Visit: Payer: Self-pay

## 2021-09-12 DIAGNOSIS — K8020 Calculus of gallbladder without cholecystitis without obstruction: Secondary | ICD-10-CM | POA: Diagnosis not present

## 2021-09-12 DIAGNOSIS — I7121 Aneurysm of the ascending aorta, without rupture: Secondary | ICD-10-CM | POA: Diagnosis not present

## 2021-09-12 DIAGNOSIS — M47816 Spondylosis without myelopathy or radiculopathy, lumbar region: Secondary | ICD-10-CM | POA: Diagnosis not present

## 2021-09-12 DIAGNOSIS — K5730 Diverticulosis of large intestine without perforation or abscess without bleeding: Secondary | ICD-10-CM | POA: Diagnosis not present

## 2021-09-12 DIAGNOSIS — T8209XS Other mechanical complication of heart valve prosthesis, sequela: Secondary | ICD-10-CM | POA: Insufficient documentation

## 2021-09-12 DIAGNOSIS — I350 Nonrheumatic aortic (valve) stenosis: Secondary | ICD-10-CM

## 2021-09-12 DIAGNOSIS — I517 Cardiomegaly: Secondary | ICD-10-CM | POA: Diagnosis not present

## 2021-09-12 MED ORDER — IOHEXOL 350 MG/ML SOLN
100.0000 mL | Freq: Once | INTRAVENOUS | Status: AC | PRN
  Administered 2021-09-12: 100 mL via INTRAVENOUS

## 2021-09-13 ENCOUNTER — Telehealth: Payer: Self-pay

## 2021-09-13 MED ORDER — METHYLPREDNISOLONE 4 MG PO TBPK: ORAL_TABLET | ORAL | 0 refills | Status: DC

## 2021-09-13 NOTE — Telephone Encounter (Signed)
Pt spoke with Orpha Bur S re: symptoms of itching since her CT yesterday suspected of allergy from the contrast dye.... not relieved with benadryl.   Per Dr. Excell Seltzer... will send in RX for Medrol dose pak.the patient to monitor and go ot Urgent Care if no relief and worsening symptoms. Marland Kitchen

## 2021-09-16 ENCOUNTER — Other Ambulatory Visit: Payer: Self-pay

## 2021-09-16 ENCOUNTER — Encounter (HOSPITAL_COMMUNITY): Payer: Self-pay | Admitting: Dentistry

## 2021-09-16 ENCOUNTER — Ambulatory Visit (INDEPENDENT_AMBULATORY_CARE_PROVIDER_SITE_OTHER): Payer: PPO | Admitting: Dentistry

## 2021-09-16 VITALS — BP 168/115 | HR 107 | Temp 98.4°F

## 2021-09-16 DIAGNOSIS — I330 Acute and subacute infective endocarditis: Secondary | ICD-10-CM

## 2021-09-16 DIAGNOSIS — K036 Deposits [accretions] on teeth: Secondary | ICD-10-CM

## 2021-09-16 DIAGNOSIS — K08109 Complete loss of teeth, unspecified cause, unspecified class: Secondary | ICD-10-CM | POA: Insufficient documentation

## 2021-09-16 DIAGNOSIS — Z952 Presence of prosthetic heart valve: Secondary | ICD-10-CM | POA: Diagnosis not present

## 2021-09-16 DIAGNOSIS — K0889 Other specified disorders of teeth and supporting structures: Secondary | ICD-10-CM

## 2021-09-16 DIAGNOSIS — Z01818 Encounter for other preprocedural examination: Secondary | ICD-10-CM

## 2021-09-16 DIAGNOSIS — F40232 Fear of other medical care: Secondary | ICD-10-CM

## 2021-09-16 DIAGNOSIS — Z0120 Encounter for dental examination and cleaning without abnormal findings: Secondary | ICD-10-CM | POA: Insufficient documentation

## 2021-09-16 DIAGNOSIS — K083 Retained dental root: Secondary | ICD-10-CM

## 2021-09-16 DIAGNOSIS — M2632 Excessive spacing of fully erupted teeth: Secondary | ICD-10-CM

## 2021-09-16 DIAGNOSIS — K045 Chronic apical periodontitis: Secondary | ICD-10-CM

## 2021-09-16 DIAGNOSIS — K030 Excessive attrition of teeth: Secondary | ICD-10-CM

## 2021-09-16 DIAGNOSIS — K056 Periodontal disease, unspecified: Secondary | ICD-10-CM

## 2021-09-16 DIAGNOSIS — K0850 Unsatisfactory restoration of tooth, unspecified: Secondary | ICD-10-CM

## 2021-09-16 DIAGNOSIS — K117 Disturbances of salivary secretion: Secondary | ICD-10-CM

## 2021-09-16 DIAGNOSIS — K029 Dental caries, unspecified: Secondary | ICD-10-CM

## 2021-09-16 NOTE — Progress Notes (Signed)
Department of Dental Medicine   Service Date:   09/16/2021  Patient Name:  Cathy Moody Date of Birth:   1951/04/14 Medical Record Number: [id number]  Referring Provider:           Tonny Bollman, M.D.   OUTPATIENT CONSULTATION PLAN/RECOMMENDATIONS   ASSESSMENT: There are no current signs of acute odontogenic infection including abscess, edema or erythema, or suspicious lesion requiring biopsy.   Severe bone loss, accretions, loose teeth, chronic periodontitis; severe decay, retained root tips.  RECOMMENDATIONS: Extractions of all indicated teeth to decrease the risk of perioperative and postoperative systemic infection and complications.  PLAN: Discuss case with medical team and coordinate treatment as needed.  Complete all dental procedures in the O.R. under general anesthesia due to the patient's severe dental phobia,  pending medical team's recommendations. Surgery date TBD  Discussed in detail all treatment options and recommendations with the patient and they are agreeable to the plan.    Thank you for consulting with [facility] and for the opportunity to participate in this patient's treatment.  Should you have any questions or concerns, please contact the [facility] at [phone].       09/16/2021 CONSULT NOTE:    COVID-19 SCREENING:  The patient denies symptoms concerning for COVID-19 infection including fever, chills, cough, or newly developed shortness of breath.   HISTORY OF PRESENT ILLNESS: Cathy Moody is a very pleasant 71 y.o. female with h/o asthma, COPD (severe), tobacco use (cigarettes- quit 07/2013), anxiety, hypothyroidism, obesity, congestive heart failure and aortic stenosis status post TAVR in 2014 who was recently diagnosed with AV vegetation and prosthetic valve dysfunction/infective endocarditis (diagnosed during recent admission in 05/2021) and is anticipating potential cardiac surgery (correction/redo of TAVR).   The patient  presents today for a medically necessary dental consultation to evaluate poor dentition in a patient with chronic IE of prosthetic valve and as a part of their pre-cardiac surgery work-up.   DENTAL HISTORY: The patient reports that she is very afraid of the dentist.  She can't remember the last time she went to see a dentist, but believes it was for a cleaning.  She says that she has a few loose teeth on the bottom in the front that do not hurt her or bother her much.  He currently denies any dental/orofacial pain or sensitivity. Patient is able to manage oral secretions.  Patient denies dysphagia, odynophagia, dysphonia.   CHIEF COMPLAINT:  Here for a preoperative dental exam and to evaluate poor dentition in the setting of prosthetic valve endocarditis.   Patient Active Problem List   Diagnosis Date Noted   Streptococcal bacteremia 06/04/2021   Rash and nonspecific skin eruption    Aortic valve endocarditis    Infective endocarditis of aortic valve 05/30/2021   Acute respiratory failure with hypoxia (HCC) 05/29/2021   Morbid obesity (HCC) 03/23/2018   Hypothyroidism 03/23/2018   S/P TAVR (transcatheter aortic valve replacement) 08/09/2013   Anxiety state 08/03/2013   Pulmonary hypertension, moderate to severe (HCC) 08/03/2013   COPD, severe (HCC) 07/29/2013   Tobacco abuse, in remission 07/29/2013   Asthma 02/14/2011   Allergic rhinitis 02/14/2011   Past Medical History:  Diagnosis Date   Acute CHF (HCC)    [name] 07/29/2013   Alcohol abuse    /notes 07/29/2013   Asthma    Hypotension    Morbid obesity (HCC)    S/P TAVR (transcatheter aortic valve replacement)    Past Surgical History:  Procedure Laterality Date  INTRAOPERATIVE TRANSESOPHAGEAL ECHOCARDIOGRAM N/A 08/09/2013   Procedure: INTRAOPERATIVE TRANSESOPHAGEAL ECHOCARDIOGRAM;  Surgeon: Alleen Borne, MD;  Location: [facility] OR;  Service: Open Heart Surgery;  Laterality: N/A;   LEFT AND RIGHT HEART CATHETERIZATION WITH  CORONARY ANGIOGRAM N/A 08/01/2013   Procedure: LEFT AND RIGHT HEART CATHETERIZATION WITH CORONARY ANGIOGRAM;  Surgeon: Lesleigh Noe, MD;  Location: [facility] CATH LAB;  Service: Cardiovascular;  Laterality: N/A;   TEE WITHOUT CARDIOVERSION N/A 05/31/2021   Procedure: TRANSESOPHAGEAL ECHOCARDIOGRAM (TEE);  Surgeon: Thomasene Ripple, DO;  Location: [facility] ENDOSCOPY;  Service: Cardiovascular;  Laterality: N/A;   TRANSCATHETER AORTIC VALVE REPLACEMENT, TRANSAPICAL Left 08/09/2013   Procedure: TRANSCATHETER AORTIC VALVE REPLACEMENT, TRANSAPICAL;  Surgeon: Alleen Borne, MD;  Location: [facility] OR;  Service: Open Heart Surgery;  Laterality: Left;   Allergies  Allergen Reactions   Penicillin G Rash   Plavix [Clopidogrel Bisulfate] Rash   Current Outpatient Medications  Medication Sig Dispense Refill   albuterol (VENTOLIN HFA) 108 (90 Base) MCG/ACT inhaler TAKE 2 PUFFS BY MOUTH EVERY 6 HOURS AS NEEDED FOR WHEEZE OR SHORTNESS OF BREATH 8.5 each 0   ALPRAZolam (XANAX) 0.25 MG tablet Take 1 tablet (0.25 mg total) by mouth 2 (two) times daily as needed for anxiety. 20 tablet 0   amoxicillin (AMOXIL) 500 MG capsule Take 1 capsule (500 mg total) by mouth 2 (two) times daily. 60 capsule 2   aspirin EC 81 MG EC tablet Take 1 tablet (81 mg total) by mouth daily.     calamine lotion Apply topically 2 (two) times daily as needed for itching (twice daily prn). (Patient not taking: Reported on 09/04/2021) 120 mL 0   fluticasone furoate-vilanterol (BREO ELLIPTA) 100-25 MCG/ACT AEPB Inhale 1 puff into the lungs daily. 1 each 11   levothyroxine (SYNTHROID) 112 MCG tablet Take 1 tablet (112 mcg total) by mouth daily before breakfast. 90 tablet 0   methylPREDNISolone (MEDROL DOSEPAK) 4 MG TBPK tablet Take as directed. 21 tablet 0   metoprolol tartrate (LOPRESSOR) 25 MG tablet Take 2 tablets (50 mg total) by mouth as directed. Please take 50mg  (two tabs) before your CT scan to lower HR. Take extra 25 mg HR remains >80 bpm 3 tablet 0    triamcinolone cream (KENALOG) 0.5 % Apply topically 2 (two) times daily. Apply over affected area twice daily (Patient not taking: Reported on 09/04/2021) 15 g 2   No current facility-administered medications for this visit.    LABS: Lab Results  Component Value Date   WBC 13.0 (H) 06/11/2021   HGB 11.9 06/11/2021   HCT 35.0 06/11/2021   MCV 96 06/11/2021   PLT 448 06/11/2021      Component Value Date/Time   NA 136 09/04/2021 1109   K 4.2 09/04/2021 1109   CL 98 09/04/2021 1109   CO2 25 09/04/2021 1109   GLUCOSE 127 (H) 09/04/2021 1109   GLUCOSE 165 (H) 06/05/2021 0440   BUN 10 09/04/2021 1109   CREATININE 0.96 09/04/2021 1109   CREATININE 0.84 08/10/2017 1556   CALCIUM 9.8 09/04/2021 1109   GFRNONAA >60 06/05/2021 0440   GFRAA >90 08/13/2013 0630   Lab Results  Component Value Date   INR 1.1 05/28/2021   INR 1.48 08/09/2013   INR 1.19 08/09/2013   No results found for: PTT  Social History   Socioeconomic History   Marital status: Divorced    Spouse name: Not on file   Number of children: Not on file   Years of education: Not on  file   Highest education level: Not on file  Occupational History   Not on file  Tobacco Use   Smoking status: Former    Types: Cigarettes    Quit date: 07/29/2013    Years since quitting: 8.1   Smokeless tobacco: Never  Vaping Use   Vaping Use: Never used  Substance and Sexual Activity   Alcohol use: Yes    Comment: Pt is weekend drinker   Drug use: No   Sexual activity: Not Currently  Other Topics Concern   Not on file  Social History Narrative   Not on file   Social Determinants of Health   Financial Resource Strain: Not on file  Food Insecurity: Not on file  Transportation Needs: Not on file  Physical Activity: Not on file  Stress: Not on file  Social Connections: Not on file  Intimate Partner Violence: Not on file   Family History  Problem Relation Age of Onset   Liver disease Son    Diabetes Paternal  Grandmother      REVIEW OF SYSTEMS:  Reviewed with the patient as per HPI. Psych: (++) Dental phobia   VITAL SIGNS: BP (!) 168/115 (BP Location: Right Arm, Patient Position: Sitting, Cuff Size: Normal)    Pulse (!) 107    Temp 98.4 F (36.9 C) (Oral)    PHYSICAL EXAM: General:  Well-developed, comfortable and in no apparent distress. Neurological:  Alert and oriented to person, place and  time. Extraoral:  Facial symmetry present without any edema or erythema.  No swelling or lymphadenopathy.  TMJ asymptomatic without clicks or crepitations.  Intraoral:  Soft tissues appear well-perfused and mucous membranes moist.  FOM and vestibules soft and not raised. Oral cavity without mass or lesion. No signs of infection, parulis, sinus tract, edema or erythema evident upon exam. (+) Mucous membranes:  Slightly dry; thick, viscous saliva   DENTAL EXAM: Hard tissue exam completed and charted.    Overall impression:  Poor remaining dentition.      Oral hygiene:  Poor    Periodontal:  Inflamed, erythematous gingival tissue.  Generalized heavy calculus accumulation. (+) Mobility:  Class III- #23, #24, #25 Caries:  #4, #5, #6, #7, #8, #9, #10, #12, #13, #19, #20, #21, #28, #29, #30, #31  Retained root tips:  #3 Defective restorations:  #4 and #30 existing fractured/broken amalgam restorations with recurrent decay Removable/fixed prosthodontics:  Patient denies wearing partial dentures.  Occlusion:  Unable to assess molar occlusion.   Non-functional teeth:  #18, #19, #30, #31 Supra-erupted teeth:  #18, #19, #30, #31 Other findings:   (+) Attrition/wear:  #6-#11 incisal (+) Diastema(s):  #7  #8, #8  #9, #9  #10   RADIOGRAPHIC EXAM:  PAN and Full Mouth Series exposed and interpreted.      Condyles seated bilaterally in fossas.  No evidence of abnormal pathology.  All visualized osseous structures appear WNL.  Teeth #'s 18, 19, 26, 27, 30 & 31 drifting mesial.  #18, #19, #30 & #31  appear supra-erupted.  Missing teeth #'s 1, 2, 14, 15, 16, 17, 25 & 32.      Generalized moderate-severe horizontal bone loss consistent with moderate to severe periodontitis.  Radiographic calculus evident. #30 has signs of bone loss w/ furcation involvement; #29 has vertical bone loss on mesial & distal w/ widening PDL    Existing restorations on #4, #18, #19 & #30.  Retained root tip #3 w/ periapical radiolucency.  Caries:  #5D, #244M, #881M&D, #81M, #44M&D, #  10D, #12D, #41M, #19D, #45M, #30D & #31D.   ASSESSMENT:  1.  Prosthetic valve endocarditis 2.  Status-post TAVR (2014) 3.  History of aortic stenosis 4.  Preoperative dental examination 5.  Missing teeth 6.  Caries 7.  Accretions on teeth 8.  Retained root tip 9.  Chronic apical periodontitis 10.  Periodontal disease 11.  Defective restorations 12.  Loose teeth 13.  Attrition/wear 14.  Diastema(s) 15.  Xerostomia 16.  Postoperative bleeding risk 17.  Dental Phobia   PLAN AND RECOMMENDATIONS: I discussed the risks, benefits, and complications of various scenarios with the patient in relationship to their medical and dental conditions, which included systemic infection such as endocarditis, bacteremia or other serious issues that could potentially occur either before, during or after their anticipated heart surgery if dental/oral concerns are not addressed.  I explained that if any chronic or acute dental/oral infection(s) are addressed and subsequently not maintained following medical optimization and recovery, their risk of the previously mentioned complications are just as high and could potentially occur postoperatively.  I explained all significant findings of the dental consultation with the patient including severe tartar or calculus build-up causing periodontal issues (bone loss, loose teeth, chronic infection of gums), teeth with cavities with one retained root (#3), and the recommended care including extractions of all grossly  decayed and/or chronically infected teeth with poor or hopeless periodontal prognosis in order to optimize them for heart surgery from a dental standpoint.  The patient verbalized understanding of all findings, discussion, and recommendations. We then discussed various treatment options to include no treatment, multiple extractions with alveoloplasty, pre-prosthetic surgery as indicated, periodontal therapy, dental restorations, root canal therapy, crown and bridge therapy, implant therapy, and replacement of missing teeth as indicated.  We discussed extractions of all maxillary teeth vs only non-restorable teeth or teeth that cannot be saved.  Discussed how complete upper denture fabrication would function better than a upper partial with the teeth she has remaining.  We also discussed the same scenario on the bottom (all extractions vs saving some teeth for partial denture clasps/abutments).  The patient verbalized understanding of all options, and currently wishes to proceed with extractions of all remaining maxillary teeth and at least teeth numbers 18, 19, 23, 24, 26, 29, 30 and 31 on the bottom with full mouth debridement/cleaning in the operating room under general anesthesia due to her severe dental phobia and medical conditions.  Surgery date TBD at this time until the patient discusses possible dates with her mother which is her ride as well as other appointments that she has scheduled coming up. Plan to discuss all findings and recommendations with medical team and coordinate future care as needed.   The patient will need to establish care at a dental office of her choice for routine dental care including replacement of missing teeth as needed, cleanings and exams.  All questions and concerns were invited and addressed.  The patient tolerated today's visit well and departed in stable condition.  I spent in excess of 120 minutes during the conduct of this consultation and >50% of this time involved  direct face-to-face encounter for counseling and/or coordination of the patient's care.      Sharman Cheek, D.M.D.

## 2021-09-16 NOTE — Patient Instructions (Signed)
[facility] DEPARTMENT OF DENTAL MEDICINE Dr. Jamesyn Moorefield B. Layla Kesling, DMD Phone: [phone] Fax: [phone]       It was a pleasure seeing you today!  Please refer to the information below regarding your dental visit with us.  Please do not hesitate to give us a call if any questions or concerns come up after you leave.    Thank you for letting us provide care for you.  If there is anything we can do for you, please let us know.    HEART VALVES AND MOUTH CARE  FACTS: If you have any infection in your mouth, it can infect your heart valve. If you heart valve is infected, you will be seriously ill. Infections in the mouth can be SILENT and do not always cause pain. Examples of infections in the mouth are gum disease, dental cavities and abscesses. Some possible signs of infection are:  Bad breath, bleeding gums, or teeth that are sensitive to sweets, hot, and/or cold. There are many other signs as well.     WHAT YOU HAVE TO DO: Brush your teeth after meals and at bedtime.  Spend at least 2 minutes brushing well, especially behind your back teeth and all around your teeth that stand alone.  Brush at the gumline also. Do not go to bed without brushing your teeth and flossing.  If your gums bleed when you brush or floss, do NOT stop brushing or flossing.  Bleeding can be a sign of inflammation or irritation from bacteria.  It usually means that your gums need more attention and better cleaning.  If your dentist or Dr. Joniyah Mallinger gave you a prescription mouthwash to use, make sure to use it as directed. If you run out of the medication, notify your pharmacy. If you were given any other medications or directions by your dentist, please follow them.  If you did not understand the directions or forget what you were told, please call.  We will be happy to refresh your memory. If you need antibiotics before dental procedures, make sure you take them one hour prior to  every dental visit as directed.  Get a dental check-up every 4-6 months in order to keep your mouth healthy, or to find and treat any new infection. You will most likely need your teeth cleaned or gums treated at the same time. If you are not able to come in for your scheduled appointment, call your dentist as soon as possible to reschedule. If you have a problem in between dental visits, call your dentist.    WE ARE A TEAM.  OUR GOAL IS: HEALTHY MOUTH, HEALTHY HEART    Questions?  Call our office during office hours at [phone].   

## 2021-09-17 ENCOUNTER — Encounter: Payer: Self-pay | Admitting: Physician Assistant

## 2021-09-17 ENCOUNTER — Other Ambulatory Visit: Payer: Self-pay | Admitting: Physician Assistant

## 2021-09-17 DIAGNOSIS — Z952 Presence of prosthetic heart valve: Secondary | ICD-10-CM

## 2021-09-17 DIAGNOSIS — I350 Nonrheumatic aortic (valve) stenosis: Secondary | ICD-10-CM

## 2021-09-17 DIAGNOSIS — I38 Endocarditis, valve unspecified: Secondary | ICD-10-CM

## 2021-09-19 ENCOUNTER — Other Ambulatory Visit: Payer: Self-pay | Admitting: Family Medicine

## 2021-09-19 DIAGNOSIS — E039 Hypothyroidism, unspecified: Secondary | ICD-10-CM

## 2021-09-24 ENCOUNTER — Telehealth: Payer: Self-pay | Admitting: Family Medicine

## 2021-09-24 NOTE — Telephone Encounter (Signed)
Left message for patient to call back and schedule Medicare Annual Wellness Visit (AWV) either virtually or in office. I left my number for patient to call [phone].  Last AWV 08/10/17  please schedule at anytime with health coach  This should be a 45 minute visit.

## 2021-10-12 ENCOUNTER — Other Ambulatory Visit: Payer: Self-pay | Admitting: Family Medicine

## 2021-10-12 DIAGNOSIS — E039 Hypothyroidism, unspecified: Secondary | ICD-10-CM

## 2021-10-14 NOTE — Telephone Encounter (Signed)
Lvm for pt to call back to schedule a med check appointment. KH ?

## 2021-10-22 ENCOUNTER — Encounter (HOSPITAL_COMMUNITY): Payer: Self-pay | Admitting: Dentistry

## 2021-10-22 ENCOUNTER — Telehealth: Payer: Self-pay | Admitting: Family Medicine

## 2021-10-22 NOTE — Telephone Encounter (Signed)
Left message for patient to call back and schedule Medicare Annual Wellness Visit (AWV) either virtually or in office. °I left my number for patient to call [phone]. ° °Last AWV 08/10/17 ° please schedule at anytime with health coach ° °This should be a 45 minute visit.   °

## 2021-10-22 NOTE — Progress Notes (Signed)
Spoke with pt for pre-op call. Pt has hx of TAVR in 2014. Denies HTN, Diabetes or other heart problems. In October, 2022 pt was admitted with a severe infection in her Aortic valve. Pt states she is feeling better, still weak at times.Denies any chest pain or shortness of breath.   Pt's surgery is scheduled as ambulatory so no Covid test is required prior to surgery.  Chart sent to Anesthesia PA

## 2021-10-23 ENCOUNTER — Encounter: Payer: Self-pay | Admitting: Internal Medicine

## 2021-10-23 ENCOUNTER — Encounter (HOSPITAL_COMMUNITY): Payer: Self-pay | Admitting: Vascular Surgery

## 2021-10-23 ENCOUNTER — Other Ambulatory Visit: Payer: Self-pay

## 2021-10-23 ENCOUNTER — Encounter (HOSPITAL_COMMUNITY): Payer: Self-pay | Admitting: Dentistry

## 2021-10-23 ENCOUNTER — Ambulatory Visit (INDEPENDENT_AMBULATORY_CARE_PROVIDER_SITE_OTHER): Payer: PPO | Admitting: Internal Medicine

## 2021-10-23 VITALS — BP 180/95 | HR 107 | Temp 98.3°F | Ht 60.0 in

## 2021-10-23 DIAGNOSIS — Z952 Presence of prosthetic heart valve: Secondary | ICD-10-CM | POA: Diagnosis not present

## 2021-10-23 DIAGNOSIS — K029 Dental caries, unspecified: Secondary | ICD-10-CM | POA: Diagnosis not present

## 2021-10-23 DIAGNOSIS — I330 Acute and subacute infective endocarditis: Secondary | ICD-10-CM | POA: Diagnosis not present

## 2021-10-23 MED ORDER — AMOXICILLIN 500 MG PO CAPS: 500.0000 mg | ORAL_CAPSULE | Freq: Two times a day (BID) | ORAL | 1 refills | Status: DC

## 2021-10-23 NOTE — Progress Notes (Addendum)
Anesthesia Chart Review: Maury Dus   Case: [id number] Date/Time: 10/24/21 0715   Procedure: DENTAL RESTORATION/EXTRACTIONS   Anesthesia type: General   Pre-op diagnosis: DENTAL CARIES, PERODONTITIS  DISEASE   [facility]: [facility] ROOM 08 / [facility]   Surgeons: Sharman Cheek, DMD       DISCUSSION: Patient is a 71 year old female scheduled for the above procedure.  Seen by ID Gwynn Burly, DO for follow-up and H&P. She had completed IV Rocephin and was on suppressive amoxicillin oral therapy. He recommended continued suppression to reduce risk of relapse.    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   Last cardiology visit was on 09/04/21 with Dr. Excell Seltzer. He noted 09/02/21 follow-up echo continued to demonstrated severe AV thickening, restriction, and stenosis of the prosthetic AV leaflets, although mean gradient down from 93 mmHg by 05/29/21 TTE to 36 mmHg. Fortunately, only with "very mild symptoms" then. He felt her treatment options were likely "quite limited." Her functional capacity and severe COPD could be prohibitive for high risk cardiac surgery, and in-valve TAVR may not be an option due to risk of reinfection. She was planning to see dental medicine for extraction of remaining teeth. She would also get a gated cardiac CTA in the future to evaluate the TAVR bioprosthesis to evaluate for "leaflet degeneration, continued evidence of vegetation, and the possibility of subacute leaflet thrombosis, would be helpful and directing her care." After this is completed her case and  treatment options will be discussed with the multidisciplinary heart valve team.  She is a same day work-up, so anesthesia team to evaluate on the day of surgery. UPDATE: Scheduling tells me that surgery will be rescheduled due to provider illness.   VS:  BP Readings from Last 3 Encounters:  10/23/21 (!) 180/95  09/16/21 (!) 168/115  09/12/21 (!) 156/77   Pulse Readings from Last 3 Encounters:  10/23/21 (!) 107  09/16/21 (!) 107  09/12/21 70     PROVIDERS: [name], MD is PCP  - Lance Muss, MD is primary cardiologist, last visit 10/27/13. More recently evaluated by Dietrich Pates, MD 05/2021 during endocarditis admission. - Tonny Bollman, MD is structural heart cardiologist - Gwynn Burly, DO is ID   LABS: For day of surgery as indicated. As of 09/04/21 results included:  Lab Results  Component Value Date   WBC 13.0 (H) 06/11/2021   HGB 11.9 06/11/2021   HCT 35.0 06/11/2021   PLT 448 06/11/2021   GLUCOSE 127 (H) 09/04/2021   ALT 12 06/11/2021   AST 13 06/11/2021   NA 136 09/04/2021   K 4.2 09/04/2021   CL 98 09/04/2021   CREATININE 0.96 09/04/2021   BUN 10 09/04/2021   CO2 25 09/04/2021   TSH 10.760 (H) 05/29/2021   INR 1.1 05/28/2021   HGBA1C 6.3 (H) 05/30/2021     PFTs > 8 years ago.   IMAGES: CTA chest/abd/pelvis 09/12/21: IMPRESSION: 1. Vascular findings and measurements pertinent to potential TAVR procedure, as detailed. Previous TAVR device in place. 2. Occluded left external iliac artery. Near occlusion of proximal right SFA and deep femoral arteries. Segmental occlusion of the SMA. 3. Moderate cardiomegaly. 4. Ascending  thoracic aorta 4.8 cm aneurysm. Ascending thoracic aortic aneurysm. Recommend semi-annual imaging followup by CTA or MRA and referral to cardiothoracic surgery if not already obtained. This recommendation follows 2010 ACCF/AHA/AATS/ACR/ASA/SCA/SCAI/SIR/STS/SVM Guidelines for the Diagnosis and Management of Patients  With Thoracic Aortic Disease. Circulation. 2010; 121: H150-V697. Aortic aneurysm NOS (ICD10-I71.9). 5. Cholelithiasis. 6. Minimal sigmoid diverticulosis. 7. Aortic Atherosclerosis (ICD10-I70.0) and Emphysema (ICD10-J43.9).   EKG: EKGs in the setting of acute sepsis/bacteremia and prosthetic AoV endocarditis: EKG 05/28/21 21:29:37: ST at 112 bpm.  Borderline intraventricular conduction delay.  Borderline repolarization abnormality.  Baseline wanderer, worse in V3.  EKG 05/28/2021 20:04:55: Afib at 133 bpm. Probable LVH with secondary repolarization abnormality.    CV: CT Coronary 09/12/21: IMPRESSION: 1. 29 mm Sapien 3 valve in good position with no obvious PVL 2. Thickened leaflets with calcium nidus particularly left cusp with possible residual vegetation. This is not classic appearance of HALT/HAM but rather post SBE 3.  Dilated ascending thoracic aorta 4.4 cm 4. Funneled appearance to stent structure due to bulky calcium behind the right cusp 5. LM above the superior stent border and RCA with VCC seems ok for valve in valve procedure 6. Planimetry AVA 2.2 cm2 and with improvement in gradients and lack of classic HALT/HAM do not think anticoagulation would be beneficial    Echo 09/02/21: IMPRESSIONS   1. The aortic valve has been replaced with a 29 mm Sapien valve. Aortic  valve regurgitation is not visualized. Effective orifice area, by VTI  measures 0.57 cm. Aortic valve mean gradient measures 36.0 mmHg. Aortic  valve Vmax measures 4.10 m/s. Aortic  valve acceleration time measures 102 msec. Echodensites on aortic valve  are present. Suggestive of critical aortic stenosis.   2. Left ventricular ejection fraction, by estimation, is 60 to 65%. The  left ventricle has normal function. The left ventricle has no regional  wall motion abnormalities. There is mild concentric left ventricular  hypertrophy. Left ventricular diastolic  parameters are indeterminate.   3. Right  ventricular systolic function is normal. The right ventricular  size is normal. Tricuspid regurgitation signal is inadequate for assessing  PA pressure.   4. Left atrial size was severely dilated.   5. The mitral valve is degenerative. Trivial mitral valve regurgitation.  No evidence of mitral stenosis. Moderate mitral annular calcification.  - Comparison(s): No significant improvement in aortic stenosis or  echodensities/vegetations.    TEE 05/31/21 (during admission for prosthetic AoV endocarditis): IMPRESSIONS: 1. Left ventricular ejection fraction, by estimation, is 60 to 65%. The  left ventricle has normal function. The left ventricle has no regional  wall motion abnormalities.   2. Right ventricular systolic function is normal. The right ventricular  size is normal.   3. The mitral valve is normal in structure. Mild to moderate mitral valve  regurgitation. No evidence of mitral stenosis.   4. There are two seperate highly mobile/oscillating masses appreciated on  the aortic bioprosthesis highly suspected to be a vegetation. Aortic valve  regurgitation is mild.   5. Severe aortic valve stenosis. There is a Edwards bioprosthetic valve  present in the aortic position. Aortic valve area, by VTI measures 0.43  cm. Aortic valve mean gradient measures 64.0 mmHg. Aortic valve Vmax  measures 4.95 m/s.   6. The inferior vena cava is normal in size with greater than 50%  respiratory variability, suggesting right atrial pressure of 3 mmHg.   7. No left atrial/left atrial appendage thrombus was detected.  - Conclusion(s)/Recommendation(s): Findings are concerning for  vegetation/infective endocarditis as detailed above. May need Cardiac CTA  for better visualization of the aortic annulus.    US Carotid 08/04/13: Summary:  - The vertebral arteries appear patent with antegrade flow.  - No evidence of 1-39 percent stenosis involving the right    internal carotid artery and the left  internal carotid    artery.    RHC/LHC 08/01/13 (PRE-TAVR 08/09/13): IMPRESSIONS:  1. Widely patent coronary arteries with no evidence of significant CAD 2. Moderate to severe pulmonary hypertension 3. Known severe left ventricular systolic dysfunction, EF less than 30% by echo 4. Critical aortic stenosis based upon echo findings    Past Medical History:  Diagnosis Date   Acute CHF (HCC)    [name] 07/29/2013   Alcohol abuse    /notes 07/29/2013   Aortic stenosis    Asthma    COPD (chronic obstructive pulmonary disease) (HCC)    Endocarditis of prosthetic aortic valve (HCC) 05/2021   Hypotension    Hypothyroidism    Morbid obesity (HCC)    S/P TAVR (transcatheter aortic valve replacement)    Thoracic ascending aortic aneurysm    4.8 asending TAA 09/12/21 CTA    Past Surgical History:  Procedure Laterality Date   INTRAOPERATIVE TRANSESOPHAGEAL ECHOCARDIOGRAM N/A 08/09/2013   Procedure: INTRAOPERATIVE TRANSESOPHAGEAL ECHOCARDIOGRAM;  Surgeon: Alleen Borne, MD;  Location: [facility] OR;  Service: Open Heart Surgery;  Laterality: N/A;   LEFT AND RIGHT HEART CATHETERIZATION WITH CORONARY ANGIOGRAM N/A 08/01/2013   Procedure: LEFT AND RIGHT HEART CATHETERIZATION WITH CORONARY ANGIOGRAM;  Surgeon: Lesleigh Noe, MD;  Location: [facility] CATH LAB;  Service: Cardiovascular;  Laterality: N/A;   TEE WITHOUT CARDIOVERSION N/A 05/31/2021   Procedure: TRANSESOPHAGEAL ECHOCARDIOGRAM (TEE);  Surgeon: Thomasene Ripple, DO;  Location: [facility] ENDOSCOPY;  Service: Cardiovascular;  Laterality: N/A;   TRANSCATHETER AORTIC VALVE REPLACEMENT, TRANSAPICAL Left 08/09/2013   Procedure: TRANSCATHETER AORTIC VALVE REPLACEMENT, TRANSAPICAL;  Surgeon: Alleen Borne, MD;  Location: [facility] OR;  Service: Open Heart Surgery;  Laterality: Left;    MEDICATIONS: No current facility-administered medications for this encounter.    albuterol (VENTOLIN HFA) 108 (90 Base) MCG/ACT inhaler   ALPRAZolam (XANAX) 0.25 MG tablet   aspirin EC 81  MG EC tablet   fluticasone furoate-vilanterol (BREO ELLIPTA) 100-25 MCG/ACT AEPB   ibuprofen (ADVIL) 200 MG tablet   levothyroxine (SYNTHROID) 112 MCG tablet   amoxicillin (AMOXIL) 500 MG capsule   metoprolol tartrate (LOPRESSOR) 25 MG tablet    Shonna Chock, PA-C Surgical Short Stay/Anesthesiology [facility] Phone [phone] [facility] Phone [phone] 10/23/2021 10:48 AM

## 2021-10-23 NOTE — Anesthesia Preprocedure Evaluation (Deleted)
Anesthesia Evaluation  ? ? ?Airway ? ? ? ? ? ? ? Dental ?  ?Pulmonary ?former smoker,  ?  ? ? ? ? ? ? ? Cardiovascular ? ? ? ?  ?Neuro/Psych ?  ? GI/Hepatic ?  ?Endo/Other  ? ? Renal/GU ?  ? ?  ?Musculoskeletal ? ? Abdominal ?  ?Peds ? Hematology ?  ?Anesthesia Other Findings ? ? Reproductive/Obstetrics ? ?  ? ? ? ? ? ? ? ? ? ? ? ? ? ?  ?  ? ? ? ? ? ? ? ? ?Anesthesia Physical ?Anesthesia Plan ? ?ASA:  ? ?Anesthesia Plan:   ? ?Post-op Pain Management:   ? ?Induction:  ? ?PONV Risk Score and Plan:  ? ?Airway Management Planned:  ? ?Additional Equipment:  ? ?Intra-op Plan:  ? ?Post-operative Plan:  ? ?Informed Consent:  ? ?Plan Discussed with:  ? ?Anesthesia Plan Comments: (See PAT note written 10/23/2021 by Shonna Chock, PA-C.  ?)  ? ? ? ? ? ? ?Anesthesia Quick Evaluation ? ?

## 2021-10-23 NOTE — Assessment & Plan Note (Signed)
She will have tooth extractions done tomorrow with dentistry.  ?

## 2021-10-23 NOTE — Progress Notes (Signed)
?  ? ? ? ? ?[facility] ? ?CHIEF COMPLAINT:   ? ?Follow up for prosthetic valve endocarditis ? ?SUBJECTIVE:   ? ?Cathy Moody is a 71 y.o. female with PMHx as below who presents to the clinic for prosthetic valve endocarditis.  ? ?Here today for routine follow up.  Last visit 07/12/21.   ? ?She completed IV therapy at that time in November and was started on amoxicillin 500mg  BID which she continues to take without significant issues.  She is scheduled for dental extractions tomorrow. She reports no fevers, chills.  No dental pain.  ? ?Please see A&P for the details of today's visit and status of the patient's medical problems.  ? ?Patient's Medications  ?New Prescriptions  ? No medications on file  ?Previous Medications  ? ALBUTEROL (VENTOLIN HFA) 108 (90 BASE) MCG/ACT INHALER    TAKE 2 PUFFS BY MOUTH EVERY 6 HOURS AS NEEDED FOR WHEEZE OR SHORTNESS OF BREATH  ? ALPRAZOLAM (XANAX) 0.25 MG TABLET    Take 1 tablet (0.25 mg total) by mouth 2 (two) times daily as needed for anxiety.  ? ASPIRIN EC 81 MG EC TABLET    Take 1 tablet (81 mg total) by mouth daily.  ? FLUTICASONE FUROATE-VILANTEROL (BREO ELLIPTA) 100-25 MCG/ACT AEPB    Inhale 1 puff into the lungs daily.  ? IBUPROFEN (ADVIL) 200 MG TABLET    Take 200 mg by mouth every 6 (six) hours as needed for moderate pain or headache.  ? LEVOTHYROXINE (SYNTHROID) 112 MCG TABLET    TAKE 1 TABLET BY MOUTH EVERY DAY BEFORE BREAKFAST  ? METOPROLOL TARTRATE (LOPRESSOR) 25 MG TABLET    Take 2 tablets (50 mg total) by mouth as directed. Please take 50mg  (two tabs) before your CT scan to lower HR. Take extra 25 mg HR remains >80 bpm  ?Modified Medications  ? Modified Medication Previous Medication  ? AMOXICILLIN (AMOXIL) 500 MG CAPSULE amoxicillin (AMOXIL) 500 MG capsule  ?    Take 1 capsule (500 mg total) by mouth 2 (two) times daily.    Take 500 mg by mouth 2 (two) times daily.  ?Discontinued Medications  ? CALCIUM CARBONATE (TUMS - DOSED IN MG  ELEMENTAL CALCIUM) 500 MG CHEWABLE TABLET    Chew 1 tablet by mouth daily as needed for indigestion or heartburn.  ? METHYLPREDNISOLONE (MEDROL DOSEPAK) 4 MG TBPK TABLET    Take as directed.  ?   ? ?Past Medical History:  ?Diagnosis Date  ? Acute CHF (HCC)   ? 07/29/2013  ? Alcohol abuse   ? [name] 07/29/2013  ? Asthma   ? Hypotension   ? Hypothyroidism   ? Morbid obesity (HCC)   ? S/P TAVR (transcatheter aortic valve replacement)   ? ? ?Social History  ? ?Tobacco Use  ? Smoking status: Former  ?  Types: Cigarettes  ?  Quit date: 07/29/2013  ?  Years since quitting: 8.2  ? Smokeless tobacco: Never  ?Vaping Use  ? Vaping Use: Never used  ?Substance Use Topics  ? Alcohol use: Yes  ?  Comment: occasional beer  ? Drug use: No  ? ? ?Family History  ?Problem Relation Age of Onset  ? Liver disease Son   ? Diabetes Paternal Grandmother   ? ? ?Allergies  ?Allergen Reactions  ? Iodinated Contrast Media Rash  ? Penicillin G Rash  ? Plavix [Clopidogrel Bisulfate] Rash  ? ? ?Review of Systems  ?All other systems reviewed and  are negative. ?Except as noted above. ? ?OBJECTIVE:   ? ?Vitals:  ? 10/23/21 0843  ?BP: (!) 180/95  ?Pulse: (!) 107  ?Temp: 98.3 ?F (36.8 ?C)  ?TempSrc: Oral  ?SpO2: 97%  ?Height: 5' (1.524 m)  ? ?Body mass index is 36.52 kg/m?. ? ?Physical Exam ?Constitutional:   ?   General: She is not in acute distress. ?   Appearance: Normal appearance.  ?HENT:  ?   Head: Normocephalic and atraumatic.  ?   Comments: She is wearing a mask.  ?Pulmonary:  ?   Effort: Pulmonary effort is normal. No respiratory distress.  ?Skin: ?   General: Skin is warm and dry.  ?   Findings: No rash.  ?Neurological:  ?   General: No focal deficit present.  ?   Mental Status: She is alert and oriented to person, place, and time.  ?Psychiatric:     ?   Mood and Affect: Mood normal.     ?   Behavior: Behavior normal.  ? ? ? ?Labs and Microbiology: ?CBC Latest Ref Rng & Units 06/11/2021 06/05/2021 06/04/2021  ?WBC 3.4 - 10.8 x10E3/uL  13.0(H) 12.0(H) 15.3(H)  ?Hemoglobin 11.1 - 15.9 g/dL 74.1 11.8(L) 12.1  ?Hematocrit 34.0 - 46.6 % 35.0 36.0 36.8  ?Platelets 150 - 450 x10E3/uL 448 300 319  ? ?CMP Latest Ref Rng & Units 09/04/2021 06/11/2021 06/05/2021  ?Glucose 70 - 99 mg/dL 638(G) 536(I) 680(H)  ?BUN 8 - 27 mg/dL 10 9 12   ?Creatinine 0.57 - 1.00 mg/dL 2.12 2.48  ?Sodium 134 - 144 mmol/L 136 140 139  ?Potassium 3.5 - 5.2 mmol/L 4.2 4.9 4.3  ?Chloride 96 - 106 mmol/L 98 101 105  ?CO2 20 - 29 mmol/L 25 24 29   ?Calcium 8.7 - 10.3 mg/dL 9.8 9.4 2.50)  ?Total Protein 6.0 - 8.5 g/dL - 7.1 -  ?Total Bilirubin 0.0 - 1.2 mg/dL - 0.3 -  ?Alkaline Phos 44 - 121 IU/L - 87 -  ?AST 0 - 40 IU/L - 13 -  ?ALT 0 - 32 IU/L - 12 -  ?  ? ? ?ASSESSMENT & PLAN:   ? ?Infective endocarditis of aortic valve ?Patient has history of prosthetic valve (TAVR) strep mitis endocarditis s/p 6 weeks of ceftriaxone that ended 07/12/21 and is currently on suppressive amoxicillin since that time.  Will plan to continue and refills on amoxicillin 500mg  BID sent today.  Recommend continued suppression to reduce risk of relapse and she agrees.  She is scheduled for surgery on her dental caries tomorrow.  She has follow up with cardiology in April as well as repeat Echo to discuss next steps.  Follow up 3 months. ? ?Caries ?She will have tooth extractions done tomorrow with dentistry.  ? ? ? ? ?07/14/21 ?[facility] ?[facility] ?10/23/2021, 9:12 AM ? ? ? ?

## 2021-10-23 NOTE — Patient Instructions (Signed)
Thank you for coming to see me today. It was a pleasure seeing you. ? ?To Do: ?Continue amoxicillin 500mg  twice daily ?Refills sent ?Follow up in 3 months ?Good luck with surgery tomorrow.  ? ?If you have any questions or concerns, please do not hesitate to call the office at [phone]. ? ?Take Care,  ? ?Jule Ser ? ?

## 2021-10-23 NOTE — Assessment & Plan Note (Signed)
Patient has history of prosthetic valve (TAVR) strep mitis endocarditis s/p 6 weeks of ceftriaxone that ended 07/12/21 and is currently on suppressive amoxicillin since that time.  Will plan to continue and refills on amoxicillin 500mg  BID sent today.  Recommend continued suppression to reduce risk of relapse and she agrees.  She is scheduled for surgery on her dental caries tomorrow.  She has follow up with cardiology in April as well as repeat Echo to discuss next steps.  Follow up 3 months. ?

## 2021-10-24 ENCOUNTER — Ambulatory Visit (HOSPITAL_COMMUNITY): Admission: RE | Admit: 2021-10-24 | Payer: PPO | Source: Home / Self Care | Admitting: Dentistry

## 2021-10-24 HISTORY — DX: Nonrheumatic aortic (valve) stenosis: I35.0

## 2021-10-24 HISTORY — DX: Aneurysm of the ascending aorta, without rupture: I71.21

## 2021-10-24 HISTORY — DX: Hypothyroidism, unspecified: E03.9

## 2021-10-24 HISTORY — DX: Chronic obstructive pulmonary disease, unspecified: J44.9

## 2021-10-24 SURGERY — DENTAL RESTORATION/EXTRACTIONS
Anesthesia: General

## 2021-11-13 ENCOUNTER — Telehealth: Payer: Self-pay | Admitting: Family Medicine

## 2021-11-13 NOTE — Telephone Encounter (Signed)
Left message for patient to call back and schedule Medicare Annual Wellness Visit (AWV) either virtually or in office. ?I left my number for patient to call [phone]. ? ?Last AWV ;08/10/17 ? please schedule at anytime with health coach ? ? ?

## 2021-11-26 ENCOUNTER — Ambulatory Visit: Payer: PPO | Admitting: Cardiovascular Disease

## 2021-11-26 ENCOUNTER — Encounter: Payer: Self-pay | Admitting: Cardiovascular Disease

## 2021-11-26 ENCOUNTER — Ambulatory Visit (HOSPITAL_COMMUNITY): Payer: PPO | Attending: Cardiology

## 2021-11-26 VITALS — BP 120/70 | HR 51 | Ht 60.0 in | Wt 191.4 lb

## 2021-11-26 DIAGNOSIS — J449 Chronic obstructive pulmonary disease, unspecified: Secondary | ICD-10-CM | POA: Diagnosis not present

## 2021-11-26 DIAGNOSIS — Z952 Presence of prosthetic heart valve: Secondary | ICD-10-CM

## 2021-11-26 DIAGNOSIS — K089 Disorder of teeth and supporting structures, unspecified: Secondary | ICD-10-CM

## 2021-11-26 DIAGNOSIS — I330 Acute and subacute infective endocarditis: Secondary | ICD-10-CM

## 2021-11-26 DIAGNOSIS — I350 Nonrheumatic aortic (valve) stenosis: Secondary | ICD-10-CM | POA: Insufficient documentation

## 2021-11-26 DIAGNOSIS — I38 Endocarditis, valve unspecified: Secondary | ICD-10-CM | POA: Diagnosis not present

## 2021-11-26 DIAGNOSIS — T8209XS Other mechanical complication of heart valve prosthesis, sequela: Secondary | ICD-10-CM | POA: Diagnosis not present

## 2021-11-26 LAB — ECHOCARDIOGRAM COMPLETE
AR max vel: 0.64 cm2
AV Area VTI: 0.59 cm2
AV Area mean vel: 0.6 cm2
AV Mean grad: 38 mmHg
AV Peak grad: 58.2 mmHg
Ao pk vel: 3.82 m/s
Area-P 1/2: 5.46 cm2
MV M vel: 6.75 m/s
MV Peak grad: 182.1 mmHg
P 1/2 time: 189 msec
S' Lateral: 4.5 cm

## 2021-11-26 NOTE — Patient Instructions (Signed)
Medication Instructions:  ?Your physician recommends that you continue on your current medications as directed. Please refer to the Current Medication list given to you today. ? ?*If you need a refill on your cardiac medications before your next appointment, please call your pharmacy* ? ? ?Lab Work: ?NONE ?If you have labs (blood work) drawn today and your tests are completely normal, you will receive your results only by: ?MyChart Message (if you have MyChart) OR ?A paper copy in the mail ?If you have any lab test that is abnormal or we need to change your treatment, we will call you to review the results. ? ? ?Testing/Procedures: ?NONE ? ? ?Follow-Up: ?At [facility], you and your health needs are our priority.  As part of our continuing mission to provide you with exceptional heart care, we have created designated Provider Care Teams.  These Care Teams include your primary Cardiologist (physician) and Advanced Practice Providers (APPs -  Physician Assistants and Nurse Practitioners) who all work together to provide you with the care you need, when you need it. ? ? ?Your next appointment:   ?3 month(s) ? ?The format for your next appointment:   ?In Person ? ?Provider:  ?Carlean Jews, NP ?Georgie Chard, NP ? ?  ?

## 2021-11-26 NOTE — Progress Notes (Signed)
?Cardiology Office Note:   ? ?Date:  11/26/2021  ? ?ID:  EUGENE ZEIDERS, DOB Jan 08, 1951, MRN [id number] ? ?PCP:  [name], MD ?  ?[facility] Providers ?Cardiologist:  Dietrich Pates, MD    ? ?Referring MD: [name], MD  ? ?Chief Complaint  ?Patient presents with  ? Aortic Stenosis  ? ?History of Present Illness:   ? ?Cathy Moody is a 71 y.o. female presenting for follow-up of complex aortic valve disease.  In 2014 she was diagnosed with critical aortic stenosis in the setting of congestive heart failure and severely reduced LV function with an LVEF of 25%. The patient was ultimately treated with TAVR via a transapical approach using a 29 mm Edwards SAPIEN valve.  LVEF quickly normalized and the patient did well with an uncomplicated hospital course.  The patient was subsequently lost to follow-up after 2015.  She then was hospitalized in October 2022 for Streptococcus mitis bacteremia and found to have aortic valve endocarditis with critical aortic stenosis present.  The patient had a mean transaortic gradient measured as high as 93 mmHg with calculated aortic valve area of 0.5 cm?.  TEE demonstrated restricted mobility of her aortic valve leaflets with mobile echodensities consistent with infective vegetations.  The patient was treated with IV penicillin initially and then required a change to Rocephin because of drug rash.  She completed 6 weeks of therapy.  She has been maintained on suppressive antibiotics with amoxicillin 500 mg twice daily, treated by Dr. Earlene Plater with infectious disease. ? ?The patient is here alone today.  States that she has felt somewhat "jittery" this morning.  Her shortness of breath with activity is unchanged over time.  She denies orthopnea or PND.  She has had no recent chest pain.  The patient is in a wheelchair today and has limited mobility, but is able to get around on her own normally.  She was scheduled for full dental extraction of her remaining teeth, but the surgery  had to be postponed.  She is now scheduled tentatively to be done near the end of May in the hospital.  She was offered an outpatient oral surgery consultation but she declined this. ? ?Past Medical History:  ?Diagnosis Date  ? Acute CHF (HCC)   ? [name] 07/29/2013  ? Alcohol abuse   ? [name] 07/29/2013  ? Aortic stenosis   ? Asthma   ? COPD (chronic obstructive pulmonary disease) (HCC)   ? Endocarditis of prosthetic aortic valve (HCC) 05/2021  ? Hypotension   ? Hypothyroidism   ? Morbid obesity (HCC)   ? S/P TAVR (transcatheter aortic valve replacement)   ? Thoracic ascending aortic aneurysm (HCC)   ? 4.8 asending TAA 09/12/21 CTA  ? ? ?Past Surgical History:  ?Procedure Laterality Date  ? INTRAOPERATIVE TRANSESOPHAGEAL ECHOCARDIOGRAM N/A 08/09/2013  ? Procedure: INTRAOPERATIVE TRANSESOPHAGEAL ECHOCARDIOGRAM;  Surgeon: Alleen Borne, MD;  Location: [facility] OR;  Service: Open Heart Surgery;  Laterality: N/A;  ? LEFT AND RIGHT HEART CATHETERIZATION WITH CORONARY ANGIOGRAM N/A 08/01/2013  ? Procedure: LEFT AND RIGHT HEART CATHETERIZATION WITH CORONARY ANGIOGRAM;  Surgeon: Lesleigh Noe, MD;  Location: [facility] CATH LAB;  Service: Cardiovascular;  Laterality: N/A;  ? TEE WITHOUT CARDIOVERSION N/A 05/31/2021  ? Procedure: TRANSESOPHAGEAL ECHOCARDIOGRAM (TEE);  Surgeon: Thomasene Ripple, DO;  Location: [facility] ENDOSCOPY;  Service: Cardiovascular;  Laterality: N/A;  ? TRANSCATHETER AORTIC VALVE REPLACEMENT, TRANSAPICAL Left 08/09/2013  ? Procedure: TRANSCATHETER AORTIC VALVE REPLACEMENT, TRANSAPICAL;  Surgeon: Payton Doughty  Laneta Simmers, MD;  Location: [facility] OR;  Service: Open Heart Surgery;  Laterality: Left;  ? ? ?Current Medications: ?Current Meds  ?Medication Sig  ? albuterol (VENTOLIN HFA) 108 (90 Base) MCG/ACT inhaler TAKE 2 PUFFS BY MOUTH EVERY 6 HOURS AS NEEDED FOR WHEEZE OR SHORTNESS OF BREATH  ? ALPRAZolam (XANAX) 0.25 MG tablet Take 1 tablet (0.25 mg total) by mouth 2 (two) times daily as needed for anxiety.  ? amoxicillin (AMOXIL) 500 MG  capsule Take 1 capsule (500 mg total) by mouth 2 (two) times daily.  ? aspirin EC 81 MG EC tablet Take 1 tablet (81 mg total) by mouth daily.  ? fluticasone furoate-vilanterol (BREO ELLIPTA) 100-25 MCG/ACT AEPB Inhale 1 puff into the lungs daily.  ? ibuprofen (ADVIL) 200 MG tablet Take 200 mg by mouth every 6 (six) hours as needed for moderate pain or headache.  ? levothyroxine (SYNTHROID) 112 MCG tablet TAKE 1 TABLET BY MOUTH EVERY DAY BEFORE BREAKFAST  ?  ? ?Allergies:   Iodinated contrast media, Penicillin g, and Plavix [clopidogrel bisulfate]  ? ?Social History  ? ?Socioeconomic History  ? Marital status: Divorced  ?  Spouse name: Not on file  ? Number of children: Not on file  ? Years of education: Not on file  ? Highest education level: Not on file  ?Occupational History  ? Not on file  ?Tobacco Use  ? Smoking status: Former  ?  Types: Cigarettes  ?  Quit date: 07/29/2013  ?  Years since quitting: 8.3  ? Smokeless tobacco: Never  ?Vaping Use  ? Vaping Use: Never used  ?Substance and Sexual Activity  ? Alcohol use: Yes  ?  Comment: occasional beer  ? Drug use: No  ? Sexual activity: Not Currently  ?Other Topics Concern  ? Not on file  ?Social History Narrative  ? Not on file  ? ?Social Determinants of Health  ? ?Financial Resource Strain: Not on file  ?Food Insecurity: Not on file  ?Transportation Needs: Not on file  ?Physical Activity: Not on file  ?Stress: Not on file  ?Social Connections: Not on file  ?  ? ?Family History: ?The patient's family history includes Diabetes in her paternal grandmother; Liver disease in her son. ? ?ROS:   ?Please see the history of present illness.    ?All other systems reviewed and are negative. ? ?EKGs/Labs/Other Studies Reviewed:   ? ?The following studies were reviewed today: ?Cardiac CTA 09/12/2021: ?IMPRESSION: ?1. 29 mm Sapien 3 valve in good position with no obvious PVL ?  ?2. Thickened leaflets with calcium nidus particularly left cusp with ?possible residual vegetation.  This is not classic appearance of ?HALT/HAM but rather post SBE ?  ?3.  Dilated ascending thoracic aorta 4.4 cm ?  ?4. Funneled appearance to stent structure due to bulky calcium ?behind the right cusp ?  ?5. LM above the superior stent border and RCA with VCC seems ok for ?valve in valve procedure ?  ?6. Planimetry AVA 2.2 cm2 and with improvement in gradients and lack ?of classic HALT/HAM do not think anticoagulation would be beneficial ? ? ? ?1. The aortic valve has been repaired/replaced. There is a 29 mm Sapien  ?prosthetic (TAVR) valve present in the aortic position. EROA 0.6cm2,  ?Aortic valve mean gradient measures 38.0 mmHg. Aortic valve Vmax measures  ?3.82 m/s. DI 0.2. LVOT VTI low at  ?11.4. Aortic valve acceleration time . Findings suggestive of severe  ?bioprosthetic aortic valve stenosis. There is mild aortic regurgitation.  ?  2. There is an echobright, mobile density visualized in the LVOT on clips  ?60 and 69 that appears attached to the LV side of the AoV prosthesis. This  ?appears new compared to prior TTE in 08/2021 likely related to valve  ?degeneration and recent episode of  ?bioprosthetic AoV endocarditis.  ? 3. Left ventricular ejection fraction, by estimation, is 50 to 55%. The  ?left ventricle has low normal function. The left ventricle has no regional  ?wall motion abnormalities. There is mild concentric left ventricular  ?hypertrophy. Left ventricular  ?diastolic parameters are indeterminate.  ? 4. Right ventricular systolic function is mildly reduced. The right  ?ventricular size is normal. There is mildly elevated pulmonary artery  ?systolic pressure. The estimated right ventricular systolic pressure is  ?43.4 mmHg.  ? 5. Left atrial size was severely dilated.  ? 6. The mitral valve is abnormal. Moderate mitral valve regurgitation.  ?Severe mitral annular calcification.  ? 7. The inferior vena cava is normal in size with greater than 50%  ?respiratory variability, suggesting right  atrial pressure of 3 mmHg.  ? ?Comparison(s): Compared to prior study on 08/2021, the LVEF appears  ?slightly lower 50-55% (previously reported as 60-65%). There continues to  ?be severe bioprosthetic AoV st

## 2021-11-29 NOTE — Addendum Note (Signed)
Addended by: Gaetano Net on: 11/29/2021 03:16 PM ? ? Modules accepted: Orders ? ?

## 2021-12-09 ENCOUNTER — Telehealth: Payer: Self-pay | Admitting: Family Medicine

## 2021-12-09 NOTE — Telephone Encounter (Signed)
Left message for patient to call back and schedule Medicare Annual Wellness Visit (AWV) either virtually or in office. ?I left my number for patient to call [phone]. ? ?Last AWV ;08/10/17 ? please schedule at anytime with health coach ? ? ?

## 2022-01-08 ENCOUNTER — Telehealth: Payer: Self-pay | Admitting: Family Medicine

## 2022-01-08 NOTE — Telephone Encounter (Signed)
Left message for patient to call back and schedule Medicare Annual Wellness Visit (AWV) either virtually or in office. ?I left my number for patient to call [phone]. ? ?Last AWV ;08/10/17 ? please schedule at anytime with health coach ? ? ?

## 2022-01-27 NOTE — Progress Notes (Unsigned)
[facility]  CHIEF COMPLAINT:    Follow up for prosthetic valve endocarditis  SUBJECTIVE:    Cathy Moody is a 71 y.o. female with PMHx as below who presents to the clinic for prosthetic valve endocarditis.   She is here today for routine follow up.  She was last seen on October 23, 2021.  During that interval she has continued on amoxicillin 500 mg BID for chronic suppression of Strep mitis bacteremia and PVE after completing 6 weeks of IV therapy in November 2022.  She saw her cardiologist (Dr Excell Seltzer) on November 26, 2021 and had a TTE at that time as well.  This study was notable for a mobile echodensity on the ventricular side of the aortic prosthesis that was not appreciated on her prior Echo.  There was no large vegetation present.   She is not considered to be a surgical candidate at this time.  Today, she reports ***.  She will see Dr Laneta Simmers on 03/26/22.  Please see A&P for the details of today's visit and status of the patient's medical problems.   Patient's Medications  New Prescriptions   No medications on file  Previous Medications   ALBUTEROL (VENTOLIN HFA) 108 (90 BASE) MCG/ACT INHALER    TAKE 2 PUFFS BY MOUTH EVERY 6 HOURS AS NEEDED FOR WHEEZE OR SHORTNESS OF BREATH   ALPRAZOLAM (XANAX) 0.25 MG TABLET    Take 1 tablet (0.25 mg total) by mouth 2 (two) times daily as needed for anxiety.   AMOXICILLIN (AMOXIL) 500 MG CAPSULE    Take 1 capsule (500 mg total) by mouth 2 (two) times daily.   ASPIRIN EC 81 MG EC TABLET    Take 1 tablet (81 mg total) by mouth daily.   FLUTICASONE FUROATE-VILANTEROL (BREO ELLIPTA) 100-25 MCG/ACT AEPB    Inhale 1 puff into the lungs daily.   IBUPROFEN (ADVIL) 200 MG TABLET    Take 200 mg by mouth every 6 (six) hours as needed for moderate pain or headache.   LEVOTHYROXINE (SYNTHROID) 112 MCG TABLET    TAKE 1 TABLET BY MOUTH EVERY DAY BEFORE BREAKFAST   METOPROLOL TARTRATE (LOPRESSOR) 25 MG TABLET    Take 2 tablets (50 mg  total) by mouth as directed. Please take 50mg  (two tabs) before your CT scan to lower HR. Take extra 25 mg HR remains >80 bpm  Modified Medications   No medications on file  Discontinued Medications   No medications on file      Past Medical History:  Diagnosis Date   Acute CHF (HCC)    07/29/2013   Alcohol abuse    /notes 07/29/2013   Aortic stenosis    Asthma    COPD (chronic obstructive pulmonary disease) (HCC)    Endocarditis of prosthetic aortic valve (HCC) 05/2021   Hypotension    Hypothyroidism    Morbid obesity (HCC)    S/P TAVR (transcatheter aortic valve replacement)    Thoracic ascending aortic aneurysm (HCC)    4.8 asending TAA 09/12/21 CTA    Social History   Tobacco Use   Smoking status: Former    Types: Cigarettes    Quit date: 07/29/2013    Years since quitting: 8.5   Smokeless tobacco: Never  Vaping Use   Vaping Use: Never used  Substance Use Topics   Alcohol use: Yes    Comment: occasional beer   Drug use: No    Family History  Problem  Relation Age of Onset   Liver disease Son    Diabetes Paternal Grandmother     Allergies  Allergen Reactions   Iodinated Contrast Media Rash   Penicillin G Rash   Plavix [Clopidogrel Bisulfate] Rash    ROS   OBJECTIVE:    There were no vitals filed for this visit. There is no height or weight on file to calculate BMI.  Physical Exam   Labs and Microbiology:    Latest Ref Rng & Units 06/11/2021    4:28 PM 06/05/2021    4:40 AM 06/04/2021    4:20 AM  CBC  WBC 3.4 - 10.8 x10E3/uL 13.0   12.0   15.3    Hemoglobin 11.1 - 15.9 g/dL 25.3   66.4   40.3    Hematocrit 34.0 - 46.6 % 35.0   36.0   36.8    Platelets 150 - 450 x10E3/uL 448   300   319        Latest Ref Rng & Units 09/04/2021   11:09 AM 06/11/2021    4:28 PM 06/05/2021    4:40 AM  CMP  Glucose 70 - 99 mg/dL 474   259   563    BUN 8 - 27 mg/dL 10   9   12     Creatinine 0.57 - 1.00 mg/dL   8.75   6.43    Sodium 134 - 144  mmol/L 136   140   139    Potassium 3.5 - 5.2 mmol/L 4.2   4.9   4.3    Chloride 96 - 106 mmol/L 98   101   105    CO2 20 - 29 mmol/L 25   24   29     Calcium 8.7 - 10.3 mg/dL 9.8   9.4   8.7    Total Protein 6.0 - 8.5 g/dL  7.1     Total Bilirubin 0.0 - 1.2 mg/dL  0.3     Alkaline Phos 44 - 121 IU/L  87     AST 0 - 40 IU/L  13     ALT 0 - 32 IU/L  12        No results found for this or any previous visit (from the past 240 hour(s)).  Imaging: ***   ASSESSMENT & PLAN:    No problem-specific Assessment & Plan notes found for this encounter.   No orders of the defined types were placed in this encounter.    There are no diagnoses linked to this encounter.  Patient has a history of prosthetic valve (TAVR) strep mitis endocarditis s/p 6 weeks of ceftriaxone (end date 07/12/21) and is currently on suppressive amoxicillin 500mg  BID.  She is tolerating this well so far and will continue for now.  She will follow up with cardiology and CVTS but seems unlikely to be a surgical candidate in the future.  Plan to continue suppressive antibiotics likely indefinitely as long as she is tolerating.  Follow up in 3 months.   Will obtain labs today while on long term antibiotics.    for Infectious Disease [facility] 01/27/2022, 2:40 PM

## 2022-01-28 ENCOUNTER — Encounter: Payer: Self-pay | Admitting: Internal Medicine

## 2022-01-28 ENCOUNTER — Other Ambulatory Visit: Payer: Self-pay

## 2022-01-28 ENCOUNTER — Ambulatory Visit (INDEPENDENT_AMBULATORY_CARE_PROVIDER_SITE_OTHER): Payer: PPO | Admitting: Internal Medicine

## 2022-01-28 VITALS — BP 128/78 | HR 58 | Resp 16 | Ht 60.0 in | Wt 191.0 lb

## 2022-01-28 DIAGNOSIS — R7881 Bacteremia: Secondary | ICD-10-CM

## 2022-01-28 DIAGNOSIS — J449 Chronic obstructive pulmonary disease, unspecified: Secondary | ICD-10-CM | POA: Diagnosis not present

## 2022-01-28 DIAGNOSIS — B955 Unspecified streptococcus as the cause of diseases classified elsewhere: Secondary | ICD-10-CM

## 2022-01-28 DIAGNOSIS — J4540 Moderate persistent asthma, uncomplicated: Secondary | ICD-10-CM | POA: Diagnosis not present

## 2022-01-28 DIAGNOSIS — R21 Rash and other nonspecific skin eruption: Secondary | ICD-10-CM | POA: Diagnosis not present

## 2022-01-28 DIAGNOSIS — I330 Acute and subacute infective endocarditis: Secondary | ICD-10-CM

## 2022-01-28 MED ORDER — ALBUTEROL SULFATE HFA 108 (90 BASE) MCG/ACT IN AERS: INHALATION_SPRAY | RESPIRATORY_TRACT | 0 refills | Status: AC

## 2022-01-28 MED ORDER — AMOXICILLIN 500 MG PO CAPS: 500.0000 mg | ORAL_CAPSULE | Freq: Two times a day (BID) | ORAL | 1 refills | Status: AC

## 2022-01-28 NOTE — Assessment & Plan Note (Signed)
She has a small area where she bumped her leg.  Recommend continued wound care with neosporin and covering the area.  There is no signs of infection right now.

## 2022-01-28 NOTE — Assessment & Plan Note (Signed)
Patient has a history of prosthetic valve (TAVR) strep mitis endocarditis s/p 6 weeks of ceftriaxone (end date 07/12/21) and is currently on suppressive amoxicillin 500mg  BID.  She is tolerating this well so far and will continue for now.  She will follow up with cardiology and CVTS but seems unlikely to be a surgical candidate in the future.  Plan to continue suppressive antibiotics likely indefinitely as long as she is tolerating.  Follow up in 3 months.  Check labs today and refills on amoxicillin sent as well.

## 2022-01-28 NOTE — Patient Instructions (Signed)
Thank you for coming to see me today. It was a pleasure seeing you.  To Do: Continue amoxicillin twice daily I refilled your albuterol Labs today Follow up in 3 months  If you have any questions or concerns, please do not hesitate to call the office at [phone].  Take Care,   Gwynn Burly

## 2022-01-28 NOTE — Assessment & Plan Note (Signed)
I refilled her as needed albuterol today.

## 2022-01-29 LAB — BASIC METABOLIC PANEL
BUN/Creatinine Ratio: 15 (calc) (ref 6–22)
BUN: 16 mg/dL (ref 7–25)
CO2: 27 mmol/L (ref 20–32)
Calcium: 9.4 mg/dL (ref 8.6–10.4)
Chloride: 104 mmol/L (ref 98–110)
Creat: 1.08 mg/dL — ABNORMAL HIGH (ref 0.60–1.00)
Glucose, Bld: 158 mg/dL — ABNORMAL HIGH (ref 65–99)
Potassium: 5.2 mmol/L (ref 3.5–5.3)
Sodium: 139 mmol/L (ref 135–146)

## 2022-01-29 LAB — CBC
HCT: 37.7 % (ref 35.0–45.0)
Hemoglobin: 12.6 g/dL (ref 11.7–15.5)
MCH: 31.6 pg (ref 27.0–33.0)
MCHC: 33.4 g/dL (ref 32.0–36.0)
MCV: 94.5 fL (ref 80.0–100.0)
MPV: 9.8 fL (ref 7.5–12.5)
Platelets: 325 10*3/uL (ref 140–400)
RBC: 3.99 10*6/uL (ref 3.80–5.10)
RDW: 12.6 % (ref 11.0–15.0)
WBC: 9.1 10*3/uL (ref 3.8–10.8)

## 2022-02-04 ENCOUNTER — Telehealth: Payer: Self-pay | Admitting: Family Medicine

## 2022-02-04 NOTE — Telephone Encounter (Signed)
Left message for patient to call back and schedule Medicare Annual Wellness Visit (AWV) either virtually or in office. ?I left my number for patient to call [phone]. ? ?Last AWV ;08/10/17 ? please schedule at anytime with health coach ? ? ?

## 2022-02-24 ENCOUNTER — Inpatient Hospital Stay (HOSPITAL_COMMUNITY): Payer: PPO

## 2022-02-24 ENCOUNTER — Encounter (HOSPITAL_COMMUNITY): Admission: EM | Disposition: E | Payer: Self-pay | Source: Home / Self Care | Attending: Internal Medicine

## 2022-02-24 ENCOUNTER — Inpatient Hospital Stay (HOSPITAL_COMMUNITY)
Admission: EM | Admit: 2022-02-24 | Discharge: 2022-03-25 | DRG: 314 | Disposition: E | Payer: PPO | Attending: Internal Medicine | Admitting: Internal Medicine

## 2022-02-24 ENCOUNTER — Encounter (HOSPITAL_COMMUNITY): Payer: Self-pay | Admitting: Cardiology

## 2022-02-24 ENCOUNTER — Emergency Department (HOSPITAL_COMMUNITY): Payer: PPO

## 2022-02-24 ENCOUNTER — Other Ambulatory Visit: Payer: Self-pay

## 2022-02-24 DIAGNOSIS — G9349 Other encephalopathy: Secondary | ICD-10-CM | POA: Diagnosis not present

## 2022-02-24 DIAGNOSIS — I4891 Unspecified atrial fibrillation: Secondary | ICD-10-CM | POA: Diagnosis not present

## 2022-02-24 DIAGNOSIS — I472 Ventricular tachycardia, unspecified: Secondary | ICD-10-CM | POA: Diagnosis not present

## 2022-02-24 DIAGNOSIS — I1310 Hypertensive heart and chronic kidney disease without heart failure, with stage 1 through stage 4 chronic kidney disease, or unspecified chronic kidney disease: Secondary | ICD-10-CM | POA: Diagnosis present

## 2022-02-24 DIAGNOSIS — E875 Hyperkalemia: Secondary | ICD-10-CM | POA: Diagnosis present

## 2022-02-24 DIAGNOSIS — Z91041 Radiographic dye allergy status: Secondary | ICD-10-CM

## 2022-02-24 DIAGNOSIS — I447 Left bundle-branch block, unspecified: Secondary | ICD-10-CM | POA: Diagnosis present

## 2022-02-24 DIAGNOSIS — I462 Cardiac arrest due to underlying cardiac condition: Secondary | ICD-10-CM | POA: Diagnosis present

## 2022-02-24 DIAGNOSIS — R570 Cardiogenic shock: Secondary | ICD-10-CM | POA: Diagnosis present

## 2022-02-24 DIAGNOSIS — Z7951 Long term (current) use of inhaled steroids: Secondary | ICD-10-CM

## 2022-02-24 DIAGNOSIS — I4901 Ventricular fibrillation: Secondary | ICD-10-CM | POA: Diagnosis not present

## 2022-02-24 DIAGNOSIS — Z79899 Other long term (current) drug therapy: Secondary | ICD-10-CM

## 2022-02-24 DIAGNOSIS — Z452 Encounter for adjustment and management of vascular access device: Secondary | ICD-10-CM | POA: Diagnosis not present

## 2022-02-24 DIAGNOSIS — Z7982 Long term (current) use of aspirin: Secondary | ICD-10-CM

## 2022-02-24 DIAGNOSIS — I358 Other nonrheumatic aortic valve disorders: Secondary | ICD-10-CM

## 2022-02-24 DIAGNOSIS — I7121 Aneurysm of the ascending aorta, without rupture: Secondary | ICD-10-CM | POA: Diagnosis not present

## 2022-02-24 DIAGNOSIS — R34 Anuria and oliguria: Secondary | ICD-10-CM | POA: Diagnosis not present

## 2022-02-24 DIAGNOSIS — N189 Chronic kidney disease, unspecified: Secondary | ICD-10-CM | POA: Diagnosis not present

## 2022-02-24 DIAGNOSIS — I509 Heart failure, unspecified: Principal | ICD-10-CM

## 2022-02-24 DIAGNOSIS — E039 Hypothyroidism, unspecified: Secondary | ICD-10-CM | POA: Diagnosis not present

## 2022-02-24 DIAGNOSIS — Z20822 Contact with and (suspected) exposure to covid-19: Secondary | ICD-10-CM | POA: Diagnosis present

## 2022-02-24 DIAGNOSIS — I272 Pulmonary hypertension, unspecified: Secondary | ICD-10-CM | POA: Diagnosis present

## 2022-02-24 DIAGNOSIS — I083 Combined rheumatic disorders of mitral, aortic and tricuspid valves: Secondary | ICD-10-CM | POA: Diagnosis present

## 2022-02-24 DIAGNOSIS — Z954 Presence of other heart-valve replacement: Secondary | ICD-10-CM

## 2022-02-24 DIAGNOSIS — I5021 Acute systolic (congestive) heart failure: Secondary | ICD-10-CM | POA: Diagnosis not present

## 2022-02-24 DIAGNOSIS — Z515 Encounter for palliative care: Secondary | ICD-10-CM

## 2022-02-24 DIAGNOSIS — R0602 Shortness of breath: Secondary | ICD-10-CM | POA: Diagnosis not present

## 2022-02-24 DIAGNOSIS — I499 Cardiac arrhythmia, unspecified: Secondary | ICD-10-CM | POA: Diagnosis not present

## 2022-02-24 DIAGNOSIS — I213 ST elevation (STEMI) myocardial infarction of unspecified site: Secondary | ICD-10-CM | POA: Diagnosis not present

## 2022-02-24 DIAGNOSIS — N179 Acute kidney failure, unspecified: Secondary | ICD-10-CM | POA: Diagnosis present

## 2022-02-24 DIAGNOSIS — Z79890 Hormone replacement therapy (postmenopausal): Secondary | ICD-10-CM

## 2022-02-24 DIAGNOSIS — Z952 Presence of prosthetic heart valve: Secondary | ICD-10-CM | POA: Diagnosis not present

## 2022-02-24 DIAGNOSIS — E669 Obesity, unspecified: Secondary | ICD-10-CM | POA: Diagnosis present

## 2022-02-24 DIAGNOSIS — Y831 Surgical operation with implant of artificial internal device as the cause of abnormal reaction of the patient, or of later complication, without mention of misadventure at the time of the procedure: Secondary | ICD-10-CM | POA: Diagnosis present

## 2022-02-24 DIAGNOSIS — T826XXA Infection and inflammatory reaction due to cardiac valve prosthesis, initial encounter: Secondary | ICD-10-CM | POA: Diagnosis not present

## 2022-02-24 DIAGNOSIS — J9601 Acute respiratory failure with hypoxia: Secondary | ICD-10-CM

## 2022-02-24 DIAGNOSIS — J449 Chronic obstructive pulmonary disease, unspecified: Secondary | ICD-10-CM | POA: Diagnosis not present

## 2022-02-24 DIAGNOSIS — Z888 Allergy status to other drugs, medicaments and biological substances status: Secondary | ICD-10-CM

## 2022-02-24 DIAGNOSIS — Z91199 Patient's noncompliance with other medical treatment and regimen due to unspecified reason: Secondary | ICD-10-CM

## 2022-02-24 DIAGNOSIS — J80 Acute respiratory distress syndrome: Secondary | ICD-10-CM | POA: Diagnosis not present

## 2022-02-24 DIAGNOSIS — E872 Acidosis, unspecified: Secondary | ICD-10-CM | POA: Diagnosis present

## 2022-02-24 DIAGNOSIS — Z87891 Personal history of nicotine dependence: Secondary | ICD-10-CM

## 2022-02-24 DIAGNOSIS — J811 Chronic pulmonary edema: Secondary | ICD-10-CM | POA: Diagnosis not present

## 2022-02-24 DIAGNOSIS — Z66 Do not resuscitate: Secondary | ICD-10-CM | POA: Diagnosis not present

## 2022-02-24 DIAGNOSIS — Z6837 Body mass index (BMI) 37.0-37.9, adult: Secondary | ICD-10-CM

## 2022-02-24 DIAGNOSIS — Z880 Allergy status to penicillin: Secondary | ICD-10-CM

## 2022-02-24 DIAGNOSIS — R404 Transient alteration of awareness: Secondary | ICD-10-CM | POA: Diagnosis not present

## 2022-02-24 DIAGNOSIS — I5023 Acute on chronic systolic (congestive) heart failure: Secondary | ICD-10-CM | POA: Diagnosis not present

## 2022-02-24 DIAGNOSIS — J9690 Respiratory failure, unspecified, unspecified whether with hypoxia or hypercapnia: Secondary | ICD-10-CM | POA: Diagnosis present

## 2022-02-24 DIAGNOSIS — Z792 Long term (current) use of antibiotics: Secondary | ICD-10-CM

## 2022-02-24 DIAGNOSIS — I110 Hypertensive heart disease with heart failure: Secondary | ICD-10-CM | POA: Diagnosis not present

## 2022-02-24 LAB — COMPREHENSIVE METABOLIC PANEL
ALT: 450 U/L — ABNORMAL HIGH (ref 0–44)
AST: 557 U/L — ABNORMAL HIGH (ref 15–41)
Albumin: 2.9 g/dL — ABNORMAL LOW (ref 3.5–5.0)
Alkaline Phosphatase: 73 U/L (ref 38–126)
Anion gap: 20 — ABNORMAL HIGH (ref 5–15)
BUN: 34 mg/dL — ABNORMAL HIGH (ref 8–23)
CO2: 15 mmol/L — ABNORMAL LOW (ref 22–32)
Calcium: 8.4 mg/dL — ABNORMAL LOW (ref 8.9–10.3)
Chloride: 102 mmol/L (ref 98–111)
Creatinine, Ser: 2.38 mg/dL — ABNORMAL HIGH (ref 0.44–1.00)
GFR, Estimated: 21 mL/min — ABNORMAL LOW (ref >60)
Glucose, Bld: 530 mg/dL (ref 70–99)
Potassium: 6.1 mmol/L — ABNORMAL HIGH (ref 3.5–5.1)
Sodium: 137 mmol/L (ref 135–145)
Total Bilirubin: 2.3 mg/dL — ABNORMAL HIGH (ref 0.3–1.2)
Total Protein: 5.2 g/dL — ABNORMAL LOW (ref 6.5–8.1)

## 2022-02-24 LAB — I-STAT VENOUS BLOOD GAS, ED
Acid-base deficit: 17 mmol/L — ABNORMAL HIGH (ref 0.0–2.0)
Bicarbonate: 10.2 mmol/L — ABNORMAL LOW (ref 20.0–28.0)
Calcium, Ion: 0.75 mmol/L — CL (ref 1.15–1.40)
HCT: 51 % — ABNORMAL HIGH (ref 36.0–46.0)
Hemoglobin: 17.3 g/dL — ABNORMAL HIGH (ref 12.0–15.0)
O2 Saturation: 59 %
Potassium: 6.7 mmol/L (ref 3.5–5.1)
Sodium: 138 mmol/L (ref 135–145)
TCO2: 11 mmol/L — ABNORMAL LOW (ref 22–32)
pCO2, Ven: 27.5 mmHg — ABNORMAL LOW (ref 44–60)
pH, Ven: 7.177 — CL (ref 7.25–7.43)
pO2, Ven: 38 mmHg (ref 32–45)

## 2022-02-24 LAB — I-STAT CHEM 8, ED
BUN: 44 mg/dL — ABNORMAL HIGH (ref 8–23)
Calcium, Ion: 0.69 mmol/L — CL (ref 1.15–1.40)
Chloride: 120 mmol/L — ABNORMAL HIGH (ref 98–111)
Creatinine, Ser: 2.1 mg/dL — ABNORMAL HIGH (ref 0.44–1.00)
Glucose, Bld: 113 mg/dL — ABNORMAL HIGH (ref 70–99)
HCT: 50 % — ABNORMAL HIGH (ref 36.0–46.0)
Hemoglobin: 17 g/dL — ABNORMAL HIGH (ref 12.0–15.0)
Potassium: 6.8 mmol/L (ref 3.5–5.1)
Sodium: 138 mmol/L (ref 135–145)
TCO2: 8 mmol/L — ABNORMAL LOW (ref 22–32)

## 2022-02-24 LAB — BASIC METABOLIC PANEL
Anion gap: 18 — ABNORMAL HIGH (ref 5–15)
BUN: 36 mg/dL — ABNORMAL HIGH (ref 8–23)
CO2: 20 mmol/L — ABNORMAL LOW (ref 22–32)
Calcium: 9.1 mg/dL (ref 8.9–10.3)
Chloride: 107 mmol/L (ref 98–111)
Creatinine, Ser: 2.36 mg/dL — ABNORMAL HIGH (ref 0.44–1.00)
GFR, Estimated: 22 mL/min — ABNORMAL LOW (ref >60)
Glucose, Bld: 196 mg/dL — ABNORMAL HIGH (ref 70–99)
Potassium: 4.5 mmol/L (ref 3.5–5.1)
Sodium: 145 mmol/L (ref 135–145)

## 2022-02-24 LAB — CBC WITH DIFFERENTIAL/PLATELET
Abs Immature Granulocytes: 0.25 10*3/uL — ABNORMAL HIGH (ref 0.00–0.07)
Basophils Absolute: 0 10*3/uL (ref 0.0–0.1)
Basophils Relative: 0 %
Eosinophils Absolute: 0.1 10*3/uL (ref 0.0–0.5)
Eosinophils Relative: 0 %
HCT: 44.5 % (ref 36.0–46.0)
Hemoglobin: 13.9 g/dL (ref 12.0–15.0)
Immature Granulocytes: 2 %
Lymphocytes Relative: 3 %
Lymphs Abs: 0.4 10*3/uL — ABNORMAL LOW (ref 0.7–4.0)
MCH: 33.1 pg (ref 26.0–34.0)
MCHC: 31.2 g/dL (ref 30.0–36.0)
MCV: 106 fL — ABNORMAL HIGH (ref 80.0–100.0)
Monocytes Absolute: 0.2 10*3/uL (ref 0.1–1.0)
Monocytes Relative: 2 %
Neutro Abs: 12.4 10*3/uL — ABNORMAL HIGH (ref 1.7–7.7)
Neutrophils Relative %: 93 %
Platelets: 160 10*3/uL (ref 150–400)
RBC: 4.2 MIL/uL (ref 3.87–5.11)
RDW: 15.6 % — ABNORMAL HIGH (ref 11.5–15.5)
WBC: 13.3 10*3/uL — ABNORMAL HIGH (ref 4.0–10.5)
nRBC: 0.6 % — ABNORMAL HIGH (ref 0.0–0.2)

## 2022-02-24 LAB — POCT I-STAT 7, (LYTES, BLD GAS, ICA,H+H)
Acid-base deficit: 8 mmol/L — ABNORMAL HIGH (ref 0.0–2.0)
Bicarbonate: 20.9 mmol/L (ref 20.0–28.0)
Calcium, Ion: 1.12 mmol/L — ABNORMAL LOW (ref 1.15–1.40)
HCT: 41 % (ref 36.0–46.0)
Hemoglobin: 13.9 g/dL (ref 12.0–15.0)
O2 Saturation: 100 %
Patient temperature: 99.6
Potassium: 4.5 mmol/L (ref 3.5–5.1)
Sodium: 144 mmol/L (ref 135–145)
TCO2: 22 mmol/L (ref 22–32)
pCO2 arterial: 54.7 mmHg — ABNORMAL HIGH (ref 32–48)
pH, Arterial: 7.192 — CL (ref 7.35–7.45)
pO2, Arterial: 254 mmHg — ABNORMAL HIGH (ref 83–108)

## 2022-02-24 LAB — ECHOCARDIOGRAM LIMITED
AR max vel: 0.42 cm2
AV Area VTI: 0.44 cm2
AV Area mean vel: 0.44 cm2
AV Mean grad: 41 mmHg
AV Peak grad: 81.4 mmHg
Ao pk vel: 4.51 m/s
Height: 60 in
MV M vel: 5.36 m/s
MV Peak grad: 114.9 mmHg
Radius: 0.9 cm
Weight: 3054.69 oz

## 2022-02-24 LAB — TYPE AND SCREEN
ABO/RH(D): O POS
Antibody Screen: NEGATIVE

## 2022-02-24 LAB — COOXEMETRY PANEL
Carboxyhemoglobin: 1.8 % — ABNORMAL HIGH (ref 0.5–1.5)
Carboxyhemoglobin: 3.7 % — ABNORMAL HIGH (ref 0.5–1.5)
Methemoglobin: 1 % (ref 0.0–1.5)
Methemoglobin: <0.7 % (ref 0.0–1.5)
O2 Saturation: 66.6 %
O2 Saturation: 94.3 %
Total hemoglobin: 14.3 g/dL (ref 12.0–16.0)
Total hemoglobin: 14.8 g/dL (ref 12.0–16.0)

## 2022-02-24 LAB — LACTIC ACID, PLASMA
Lactic Acid, Venous: 8.2 mmol/L (ref 0.5–1.9)
Lactic Acid, Venous: 6.2 mmol/L (ref 0.5–1.9)

## 2022-02-24 LAB — GLUCOSE, CAPILLARY
Glucose-Capillary: 138 mg/dL — ABNORMAL HIGH (ref 70–99)
Glucose-Capillary: 159 mg/dL — ABNORMAL HIGH (ref 70–99)
Glucose-Capillary: 134 mg/dL — ABNORMAL HIGH (ref 70–99)

## 2022-02-24 LAB — PROCALCITONIN: Procalcitonin: 0.11 ng/mL

## 2022-02-24 LAB — MRSA NEXT GEN BY PCR, NASAL: MRSA by PCR Next Gen: NOT DETECTED

## 2022-02-24 LAB — BRAIN NATRIURETIC PEPTIDE: B Natriuretic Peptide: >4500 pg/mL — ABNORMAL HIGH (ref 0.0–100.0)

## 2022-02-24 LAB — HEMOGLOBIN A1C
Hgb A1c MFr Bld: 6.3 % — ABNORMAL HIGH (ref 4.8–5.6)
Mean Plasma Glucose: 134.11 mg/dL

## 2022-02-24 LAB — TROPONIN I (HIGH SENSITIVITY)
Troponin I (High Sensitivity): 275 ng/L (ref <18)
Troponin I (High Sensitivity): 401 ng/L (ref <18)
Troponin I (High Sensitivity): 1045 ng/L (ref <18)

## 2022-02-24 LAB — PROTIME-INR
INR: 2 — ABNORMAL HIGH (ref 0.8–1.2)
Prothrombin Time: 22.6 seconds — ABNORMAL HIGH (ref 11.4–15.2)

## 2022-02-24 LAB — TSH: TSH: 6.648 u[IU]/mL — ABNORMAL HIGH (ref 0.350–4.500)

## 2022-02-24 LAB — APTT: aPTT: 33 seconds (ref 24–36)

## 2022-02-24 SURGERY — LEFT HEART CATH AND CORONARY ANGIOGRAPHY
Anesthesia: LOCAL

## 2022-02-24 MED ORDER — REVEFENACIN 175 MCG/3ML IN SOLN
175.0000 ug | Freq: Every day | RESPIRATORY_TRACT | Status: DC
  Filled 2022-02-24 (×2): qty 3

## 2022-02-24 MED ORDER — AMIODARONE HCL IN DEXTROSE 360-4.14 MG/200ML-% IV SOLN
60.0000 mg/h | INTRAVENOUS | Status: AC
  Administered 2022-02-24 (×2): 60 mg/h via INTRAVENOUS
  Filled 2022-02-24: qty 200

## 2022-02-24 MED ORDER — IPRATROPIUM BROMIDE 0.02 % IN SOLN: 0.5000 mg | Freq: Once | RESPIRATORY_TRACT | Status: DC

## 2022-02-24 MED ORDER — AMIODARONE LOAD VIA INFUSION
150.0000 mg | Freq: Once | INTRAVENOUS | Status: AC
  Administered 2022-02-24: 150 mg via INTRAVENOUS

## 2022-02-24 MED ORDER — HEPARIN SODIUM (PORCINE) 5000 UNIT/ML IJ SOLN
5000.0000 [IU] | Freq: Three times a day (TID) | INTRAMUSCULAR | Status: DC
  Administered 2022-02-24: 5000 [IU] via SUBCUTANEOUS
  Filled 2022-02-24: qty 1

## 2022-02-24 MED ORDER — ETOMIDATE 2 MG/ML IV SOLN
INTRAVENOUS | Status: DC | PRN
  Administered 2022-02-24: 20 mg via INTRAVENOUS

## 2022-02-24 MED ORDER — FENTANYL 2500MCG IN NS 250ML (10MCG/ML) PREMIX INFUSION
25.0000 ug/h | INTRAVENOUS | Status: DC
  Administered 2022-02-25: 25 ug/h via INTRAVENOUS
  Filled 2022-02-24 (×2): qty 250

## 2022-02-24 MED ORDER — NOREPINEPHRINE 16 MG/250ML-% IV SOLN
5.0000 ug/min | INTRAVENOUS | Status: DC
  Administered 2022-02-24: 24 ug/min via INTRAVENOUS
  Filled 2022-02-24: qty 250

## 2022-02-24 MED ORDER — SODIUM POLYSTYRENE SULFONATE 15 GM/60ML PO SUSP
30.0000 g | Freq: Once | ORAL | Status: AC
  Administered 2022-02-24: 30 g
  Filled 2022-02-24: qty 120

## 2022-02-24 MED ORDER — SODIUM ZIRCONIUM CYCLOSILICATE 10 G PO PACK: 10.0000 g | PACK | Freq: Every day | ORAL | Status: DC

## 2022-02-24 MED ORDER — LEVOTHYROXINE SODIUM 112 MCG PO TABS
112.0000 ug | ORAL_TABLET | Freq: Every day | ORAL | Status: DC
Start: 2022-02-25 — End: 2022-02-25
  Filled 2022-02-24: qty 1

## 2022-02-24 MED ORDER — CALCIUM GLUCONATE 10 % IV SOLN
1.0000 g | Freq: Once | INTRAVENOUS | Status: AC
  Administered 2022-02-24: 1 g via INTRAVENOUS

## 2022-02-24 MED ORDER — FENTANYL CITRATE (PF) 100 MCG/2ML IJ SOLN
25.0000 ug | INTRAMUSCULAR | Status: DC | PRN
  Administered 2022-02-24 – 2022-02-25 (×2): 25 ug via INTRAVENOUS
  Filled 2022-02-24 (×2): qty 2

## 2022-02-24 MED ORDER — PIPERACILLIN-TAZOBACTAM 3.375 G IVPB
3.3750 g | Freq: Three times a day (TID) | INTRAVENOUS | Status: DC
  Filled 2022-02-24 (×2): qty 50

## 2022-02-24 MED ORDER — SODIUM BICARBONATE 8.4 % IV SOLN
INTRAVENOUS | Status: DC
  Filled 2022-02-24: qty 1000

## 2022-02-24 MED ORDER — VANCOMYCIN HCL 750 MG/150ML IV SOLN
750.0000 mg | INTRAVENOUS | Status: DC
Start: 2022-02-26 — End: 2022-02-25

## 2022-02-24 MED ORDER — ORAL CARE MOUTH RINSE
15.0000 mL | OROMUCOSAL | Status: DC
  Administered 2022-02-24 – 2022-02-25 (×3): 15 mL via OROMUCOSAL

## 2022-02-24 MED ORDER — DEXTROSE 50 % IV SOLN
25.0000 g | Freq: Once | INTRAVENOUS | Status: AC
  Administered 2022-02-24: 25 g via INTRAVENOUS

## 2022-02-24 MED ORDER — CHLORHEXIDINE GLUCONATE CLOTH 2 % EX PADS
6.0000 | MEDICATED_PAD | Freq: Every day | CUTANEOUS | Status: DC
  Administered 2022-02-24: 6 via TOPICAL

## 2022-02-24 MED ORDER — PANTOPRAZOLE 2 MG/ML SUSPENSION
40.0000 mg | Freq: Every day | ORAL | Status: DC
  Administered 2022-02-24: 40 mg
  Filled 2022-02-24 (×2): qty 20

## 2022-02-24 MED ORDER — PANTOPRAZOLE SODIUM 40 MG IV SOLR
40.0000 mg | Freq: Every day | INTRAVENOUS | Status: DC
  Administered 2022-02-24: 40 mg via INTRAVENOUS
  Filled 2022-02-24: qty 10

## 2022-02-24 MED ORDER — AMIODARONE HCL IN DEXTROSE 360-4.14 MG/200ML-% IV SOLN: 30.0000 mg/h | INTRAVENOUS | Status: DC

## 2022-02-24 MED ORDER — POLYETHYLENE GLYCOL 3350 17 G PO PACK: 17.0000 g | PACK | Freq: Every day | ORAL | Status: DC | PRN

## 2022-02-24 MED ORDER — SODIUM CHLORIDE 0.9 % IV SOLN: INTRAVENOUS | Status: DC | PRN

## 2022-02-24 MED ORDER — SODIUM BICARBONATE 8.4 % IV SOLN
100.0000 meq | Freq: Once | INTRAVENOUS | Status: AC
  Administered 2022-02-24: 100 meq via INTRAVENOUS
  Filled 2022-02-24: qty 50

## 2022-02-24 MED ORDER — PIPERACILLIN-TAZOBACTAM 3.375 G IVPB 30 MIN
3.3750 g | Freq: Once | INTRAVENOUS | Status: AC
  Administered 2022-02-24: 3.375 g via INTRAVENOUS

## 2022-02-24 MED ORDER — SODIUM CHLORIDE 0.9 % IV SOLN
4.0000 g | Freq: Once | INTRAVENOUS | Status: AC
  Administered 2022-02-24: 4 g via INTRAVENOUS
  Filled 2022-02-24 (×3): qty 40

## 2022-02-24 MED ORDER — METHYLPREDNISOLONE SODIUM SUCC 125 MG IJ SOLR
125.0000 mg | Freq: Once | INTRAMUSCULAR | Status: DC
  Filled 2022-02-24: qty 2

## 2022-02-24 MED ORDER — DEXMEDETOMIDINE HCL IN NACL 400 MCG/100ML IV SOLN
0.0000 ug/kg/h | INTRAVENOUS | Status: DC
  Filled 2022-02-24: qty 100

## 2022-02-24 MED ORDER — FENTANYL CITRATE (PF) 100 MCG/2ML IJ SOLN
25.0000 ug | INTRAMUSCULAR | Status: DC | PRN
  Filled 2022-02-24: qty 2

## 2022-02-24 MED ORDER — SODIUM CHLORIDE 0.9 % IV SOLN: 250.0000 mL | INTRAVENOUS | Status: DC

## 2022-02-24 MED ORDER — VANCOMYCIN HCL 1500 MG/300ML IV SOLN
1500.0000 mg | Freq: Once | INTRAVENOUS | Status: AC
  Administered 2022-02-24: 1500 mg via INTRAVENOUS
  Filled 2022-02-24: qty 300

## 2022-02-24 MED ORDER — ALBUTEROL SULFATE (2.5 MG/3ML) 0.083% IN NEBU: 5.0000 mg | INHALATION_SOLUTION | Freq: Once | RESPIRATORY_TRACT | Status: DC

## 2022-02-24 MED ORDER — NOREPINEPHRINE 4 MG/250ML-% IV SOLN
5.0000 ug/min | INTRAVENOUS | Status: DC
  Administered 2022-02-24: 5 ug/min via INTRAVENOUS

## 2022-02-24 MED ORDER — FENTANYL CITRATE PF 50 MCG/ML IJ SOSY: 25.0000 ug | PREFILLED_SYRINGE | INTRAMUSCULAR | Status: DC | PRN

## 2022-02-24 MED ORDER — FENTANYL BOLUS VIA INFUSION: 25.0000 ug | INTRAVENOUS | Status: DC | PRN

## 2022-02-24 MED ORDER — INSULIN ASPART 100 UNIT/ML IV SOLN
10.0000 [IU] | Freq: Once | INTRAVENOUS | Status: AC
Start: 2022-02-24 — End: 2022-02-24
  Administered 2022-02-24: 10 [IU] via INTRAVENOUS

## 2022-02-24 MED ORDER — FENTANYL CITRATE (PF) 100 MCG/2ML IJ SOLN
INTRAMUSCULAR | Status: AC
  Filled 2022-02-24: qty 2

## 2022-02-24 MED ORDER — ARFORMOTEROL TARTRATE 15 MCG/2ML IN NEBU
15.0000 ug | INHALATION_SOLUTION | Freq: Two times a day (BID) | RESPIRATORY_TRACT | Status: DC
  Administered 2022-02-24: 15 ug via RESPIRATORY_TRACT
  Filled 2022-02-24 (×2): qty 2

## 2022-02-24 MED ORDER — VASOPRESSIN 20 UNITS/100 ML INFUSION FOR SHOCK
0.0000 [IU]/min | INTRAVENOUS | Status: DC
  Administered 2022-02-24: 0.03 [IU]/min via INTRAVENOUS
  Filled 2022-02-24: qty 100

## 2022-02-24 MED ORDER — SODIUM ZIRCONIUM CYCLOSILICATE 10 G PO PACK
10.0000 g | PACK | Freq: Every day | ORAL | Status: DC
  Filled 2022-02-24: qty 1

## 2022-02-24 MED ORDER — DEXTROSE 50 % IV SOLN
1.0000 | Freq: Once | INTRAVENOUS | Status: AC
Start: 2022-02-24 — End: 2022-02-24
  Administered 2022-02-24: 50 mL via INTRAVENOUS
  Filled 2022-02-24: qty 50

## 2022-02-24 MED ORDER — SODIUM BICARBONATE 8.4 % IV SOLN
100.0000 meq | Freq: Once | INTRAVENOUS | Status: AC
  Administered 2022-02-24: 100 meq via INTRAVENOUS
  Filled 2022-02-24: qty 100

## 2022-02-24 MED ORDER — INSULIN ASPART 100 UNIT/ML IJ SOLN
0.0000 [IU] | INTRAMUSCULAR | Status: DC
  Administered 2022-02-24: 2 [IU] via SUBCUTANEOUS
  Administered 2022-02-24: 3 [IU] via SUBCUTANEOUS

## 2022-02-24 MED ORDER — DOCUSATE SODIUM 100 MG PO CAPS
100.0000 mg | ORAL_CAPSULE | Freq: Two times a day (BID) | ORAL | Status: DC | PRN
Start: 2022-02-24 — End: 2022-02-24

## 2022-02-24 MED ORDER — STERILE WATER FOR INJECTION IV SOLN
INTRAVENOUS | Status: DC
  Filled 2022-02-24 (×2): qty 1000

## 2022-02-24 MED ORDER — ROCURONIUM BROMIDE 50 MG/5ML IV SOLN
INTRAVENOUS | Status: DC | PRN
  Administered 2022-02-24: 100 mg via INTRAVENOUS

## 2022-02-24 MED ORDER — DOCUSATE SODIUM 50 MG/5ML PO LIQD
100.0000 mg | Freq: Two times a day (BID) | ORAL | Status: DC | PRN
Start: 2022-02-24 — End: 2022-02-25

## 2022-02-24 MED ORDER — ONDANSETRON HCL 4 MG/2ML IJ SOLN: 4.0000 mg | Freq: Four times a day (QID) | INTRAMUSCULAR | Status: DC | PRN

## 2022-02-24 MED ORDER — NOREPINEPHRINE 4 MG/250ML-% IV SOLN
0.0000 ug/min | INTRAVENOUS | Status: DC
  Administered 2022-02-24: 5 ug/min via INTRAVENOUS

## 2022-02-24 MED ORDER — ORAL CARE MOUTH RINSE: 15.0000 mL | OROMUCOSAL | Status: DC | PRN

## 2022-02-24 NOTE — Procedures (Signed)
Arterial Catheter Insertion Procedure Note  Cathy Moody  [id number]  Apr 02, 1951  Date:03/18/2022  Time:10:00 PM    Provider Performing: Duayne Cal    Procedure: Insertion of Arterial Line (15615) with US guidance (37943)   Indication(s) Blood pressure monitoring and/or need for frequent ABGs  Consent Risks of the procedure as well as the alternatives and risks of each were explained to the patient and/or caregiver.  Consent for the procedure was obtained and is signed in the bedside chart  Anesthesia None   Time Out Verified patient identification, verified procedure, site/side was marked, verified correct patient position, special equipment/implants available, medications/allergies/relevant history reviewed, required imaging and test results available.   Sterile Technique Maximal sterile technique including full sterile barrier drape, hand hygiene, sterile gown, sterile gloves, mask, hair covering, sterile ultrasound probe cover (if used).   Procedure Description Area of catheter insertion was cleaned with chlorhexidine and draped in sterile fashion. With real-time ultrasound guidance an arterial catheter was placed into the right radial artery.  Appropriate arterial tracings confirmed on monitor.     Complications/Tolerance None; patient tolerated the procedure well.   EBL Minimal   Specimen(s) None   Cathy Moody, AGACNP-BC [facility]  See Amion for personal pager PCCM on call pager [phone] until 7pm. Please call Elink 7p-7a. [phone]  03/01/2022 10:00 PM

## 2022-02-24 NOTE — Consult Note (Addendum)
Cardiology Consultation:   Patient ID: AHRIA SLAPPEY MRN: [id number]; DOB: 03/01/1951  Admit date: 03/02/2022 Date of Consult: 03/05/2022  PCP:  [name], MD   [facility] Providers Cardiologist:  Dietrich Pates, MD        Patient Profile:   Cathy Moody is a 71 y.o. female with a hx of AS with CHF dating to 2014, EF 25%, TAVR was completed 2014, 2022 with bacteremia with AV endocarditis, with critical AS, follow up TTE with mobile echodensity not seen on prior TTE who is being seen 02/22/2022 for the evaluation of  CHF, V Tach and cardiac arrest at the request of Dr.Steinl.  other hx COPD, hypothyroidism, obesity, ascending TAA on CTA.   History of Present Illness:   Cathy Moody with above hx including critical AS, CM with EF 25% with TAVR in 07/2013, trans apical approach using a 29 mm [facility] SAPIEN valve  2022 dx streptococcus mitis bacteremia with AV endocarditis with critical AS.  Her EF did improved.   Patient had a mean transaortic gradient measured as high as 93 mmHg with calculated aortic valve area of 0.5 cm.  TEE demonstrated restricted mobility of her aortic valve leaflets with mobile echodensities consistent with infective vegetations.  She was treated with IV PCN initially then rocephin because of rash.  She completed 6 weeks of therapy and maintained suppressive antibiotics with amoxicillin 500 BID. Last OV with Dr. Excell Seltzer 11/3021 and had TTE   Echo 11/26/21 with Findings suggestive of severe  bioprosthetic aortic valve stenosis. There is mild aortic regurgitation.   There is an echobright, mobile density visualized in the LVOT on clips 60 and 69 that appears attached to the LV side of the AoV prosthesis.  This appeared new compared to 08/2021.  EF 50-55%,  mild LVH. There is mildly elevated pulmonary artery  systolic pressure. The estimated right ventricular systolic pressure is 43.4 mmHg. LA severely dilated mod MVR, severe mitral annular calcification.   She was seen back by  ID on 01/28/22 -she will continue suppressive antibiotics.       [place] was at beach with lower ext edema but she no longer has lasix.  Today EMS called for lower ext edema and confusion.  On arrival she was SOB -placed on monitor and had V tach at 140 then shocked X 1, to ST    EKG appears atrial fib with LBBB, Dr. Allyson Sabal for intervention cardiology did not believe a STEMI EKG.    In ER she lost pulse and had brief CPR.  Now intubated.   Na 138 K+ 6.8 Cr 2.10 with BUN 44 (in June Cr 1.08 and jan 0.96)   Hgb 17  PCXR   IMPRESSION: 1. Cardiomegaly with mild central pulmonary vascular congestion suggesting mild CHF/volume overload. 2. No evidence of pneumonia or overt alveolar pulmonary edema. 3. Endotracheal tube appears well positioned with tip just above the level of the carina   Past Medical History:  Diagnosis Date   Acute CHF (HCC)    [name] 07/29/2013   Alcohol abuse    /notes 07/29/2013   Aortic stenosis    Asthma    COPD (chronic obstructive pulmonary disease) (HCC)    Endocarditis of prosthetic aortic valve (HCC) 05/2021   Hypotension    Hypothyroidism    Morbid obesity (HCC)    S/P TAVR (transcatheter aortic valve replacement)    Thoracic ascending aortic aneurysm (HCC)    4.8 asending TAA 09/12/21 CTA  Past Surgical History:  Procedure Laterality Date   INTRAOPERATIVE TRANSESOPHAGEAL ECHOCARDIOGRAM N/A 08/09/2013   Procedure: INTRAOPERATIVE TRANSESOPHAGEAL ECHOCARDIOGRAM;  Surgeon: Gaye Pollack, MD;  Location: [facility] OR;  Service: Open Heart Surgery;  Laterality: N/A;   LEFT AND RIGHT HEART CATHETERIZATION WITH CORONARY ANGIOGRAM N/A 08/01/2013   Procedure: LEFT AND RIGHT HEART CATHETERIZATION WITH CORONARY ANGIOGRAM;  Surgeon: Sinclair Grooms, MD;  Location: [facility] CATH LAB;  Service: Cardiovascular;  Laterality: N/A;   TEE WITHOUT CARDIOVERSION N/A 05/31/2021   Procedure: TRANSESOPHAGEAL ECHOCARDIOGRAM (TEE);  Surgeon: Berniece Salines, DO;  Location: [facility] ENDOSCOPY;   Service: Cardiovascular;  Laterality: N/A;   TRANSCATHETER AORTIC VALVE REPLACEMENT, TRANSAPICAL Left 08/09/2013   Procedure: TRANSCATHETER AORTIC VALVE REPLACEMENT, TRANSAPICAL;  Surgeon: Gaye Pollack, MD;  Location: [facility] OR;  Service: Open Heart Surgery;  Laterality: Left;     Home Medications:  Prior to Admission medications   Medication Sig Start Date End Date Taking? Authorizing Provider  albuterol (VENTOLIN HFA) 108 (90 Base) MCG/ACT inhaler TAKE 2 PUFFS BY MOUTH EVERY 6 HOURS AS NEEDED FOR WHEEZE OR SHORTNESS OF BREATH 01/28/22   Mignon Pine, DO  ALPRAZolam Duanne Moron) 0.25 MG tablet Take 1 tablet (0.25 mg total) by mouth 2 (two) times daily as needed for anxiety. 06/11/21   Denita Lung, MD  amoxicillin (AMOXIL) 500 MG capsule Take 1 capsule (500 mg total) by mouth 2 (two) times daily. 01/28/22 07/27/22  Mignon Pine, DO  aspirin EC 81 MG EC tablet Take 1 tablet (81 mg total) by mouth daily. 08/13/13   Barrett, Erin R, PA-C  fluticasone furoate-vilanterol (BREO ELLIPTA) 100-25 MCG/ACT AEPB Inhale 1 puff into the lungs daily. 06/11/21   Denita Lung, MD  ibuprofen (ADVIL) 200 MG tablet Take 200 mg by mouth every 6 (six) hours as needed for moderate pain or headache.    [provider]  levothyroxine (SYNTHROID) 112 MCG tablet TAKE 1 TABLET BY MOUTH EVERY DAY BEFORE BREAKFAST 10/14/21   Denita Lung, MD  metoprolol tartrate (LOPRESSOR) 25 MG tablet Take 2 tablets (50 mg total) by mouth as directed. Please take 50mg  (two tabs) before your CT scan to lower HR. Take extra 25 mg HR remains >80 bpm 09/04/21 09/04/22  Eileen Stanford, PA-C    Inpatient Medications: Scheduled Meds:  amiodarone  150 mg Intravenous Once   Continuous Infusions:  amiodarone 60 mg/hr (03/10/2022 1648)   Followed by   amiodarone     PRN Meds: etomidate, rocuronium  Allergies:    Allergies  Allergen Reactions   Iodinated Contrast Media Rash   Penicillin G Rash   Plavix [Clopidogrel  Bisulfate] Rash    Social History:   Social History   Socioeconomic History   Marital status: Divorced    Spouse name: Not on file   Number of children: Not on file   Years of education: Not on file   Highest education level: Not on file  Occupational History   Not on file  Tobacco Use   Smoking status: Former    Types: Cigarettes    Quit date: 07/29/2013    Years since quitting: 8.5   Smokeless tobacco: Never  Vaping Use   Vaping Use: Never used  Substance and Sexual Activity   Alcohol use: Yes    Comment: occasional beer   Drug use: No   Sexual activity: Not Currently  Other Topics Concern   Not on file  Social History Narrative   Not on file  Social Determinants of Health   Financial Resource Strain: Not on file  Food Insecurity: Not on file  Transportation Needs: Not on file  Physical Activity: Not on file  Stress: Not on file  Social Connections: Not on file  Intimate Partner Violence: Not on file    Family History:    Family History  Problem Relation Age of Onset   Liver disease Son    Diabetes Paternal Grandmother      ROS:  Please see the history of present illness.  General:no colds or fevers, no weight changes Skin:no rashes or ulcers HEENT:no blurred vision, no congestion CV:see HPI PUL:see HPI GI:no diarrhea constipation or melena, no indigestion GU:no hematuria, no dysuria MS:no joint pain, no claudication ++ edema Neuro:no syncope, no lightheadedness + confusion. Endo:no diabetes, no thyroid disease  All other ROS reviewed and negative.     Physical Exam/Data:   Vitals:   03/10/2022 1640 02/27/2022 1641  Pulse: (!) 37 (!) 27  Resp: (!) 29 (!) 22  SpO2: 95% (!) 85%   No intake or output data in the 24 hours ending 03/20/2022 1649    01/28/2022    9:56 AM 11/26/2021    9:43 AM 09/04/2021   10:07 AM  Last 3 Weights  Weight (lbs) 191 lb 191 lb 6.4 oz 187 lb  Weight (kg) 86.637 kg 86.818 kg 84.823 kg     There is no height or weight on  file to calculate BMI.   Exam per Dr. Sallyanne Kuster    EKG:  The EKG was personally reviewed and demonstrates:  atrial fib vs atrial tach with LBBB  Telemetry:  Telemetry was personally reviewed and demonstrates:  tachycardia  Relevant CV Studies: Echo 11/26/21  IMPRESSIONS     1. The aortic valve has been repaired/replaced. There is a 29 mm Sapien  prosthetic (TAVR) valve present in the aortic position. EROA 0.6cm2,  Aortic valve mean gradient measures 38.0 mmHg. Aortic valve Vmax measures  3.82 m/s. DI 0.2. LVOT VTI low at  11.4. Aortic valve acceleration time 42msec. Findings suggestive of severe  bioprosthetic aortic valve stenosis. There is mild aortic regurgitation.   2. There is an echobright, mobile density visualized in the LVOT on clips  60 and 69 that appears attached to the LV side of the AoV prosthesis. This  appears new compared to prior TTE in 08/2021 likely related to valve  degeneration and recent episode of  bioprosthetic AoV endocarditis.   3. Left ventricular ejection fraction, by estimation, is 50 to 55%. The  left ventricle has low normal function. The left ventricle has no regional  wall motion abnormalities. There is mild concentric left ventricular  hypertrophy. Left ventricular  diastolic parameters are indeterminate.   4. Right ventricular systolic function is mildly reduced. The right  ventricular size is normal. There is mildly elevated pulmonary artery  systolic pressure. The estimated right ventricular systolic pressure is  [id number] mmHg.   5. Left atrial size was severely dilated.   6. The mitral valve is abnormal. Moderate mitral valve regurgitation.  Severe mitral annular calcification.   7. The inferior vena cava is normal in size with greater than 50%  respiratory variability, suggesting right atrial pressure of 3 mmHg.   Comparison(s): Compared to prior study on 08/2021, the LVEF appears  slightly lower 50-55% (previously reported as 60-65%). There  continues to  be severe bioprosthetic AoV stenosis with mean gradient 81mmHg (previously  70mmHg) with similar EROA of 0.6cm2.  There is now  mild AR. MR appears moderate on current study (previously  reported as trivial).   FINDINGS   Left Ventricle: Left ventricular ejection fraction, by estimation, is 50  to 55%. The left ventricle has low normal function. The left ventricle has  no regional wall motion abnormalities. The left ventricular internal  cavity size was normal in size.  There is mild concentric left ventricular hypertrophy. Left ventricular  diastolic parameters are indeterminate.   Right Ventricle: The right ventricular size is normal. No increase in  right ventricular wall thickness. Right ventricular systolic function is  mildly reduced. There is mildly elevated pulmonary artery systolic  pressure. The tricuspid regurgitant velocity   is 3.18 m/s, and with an assumed right atrial pressure of 3 mmHg, the  estimated right ventricular systolic pressure is 43.4 mmHg.   Left Atrium: Left atrial size was severely dilated.   Right Atrium: Right atrial size was normal in size.   Pericardium: There is no evidence of pericardial effusion.   Mitral Valve: The mitral valve is abnormal. There is moderate thickening  of the mitral valve leaflet(s). There is moderate calcification of the  mitral valve leaflet(s). Severe mitral annular calcification. Moderate  mitral valve regurgitation.   Tricuspid Valve: The tricuspid valve is normal in structure. Tricuspid  valve regurgitation is mild.   Aortic Valve: DI 0.18. The aortic valve has been repaired/replaced. Aortic  regurgitation PHT measures 189 msec. Aortic valve mean gradient measures  38.0 mmHg. Aortic valve peak gradient measures 58.2 mmHg. Aortic valve  area, by VTI measures 0.59 cm.  There is a 29 mm Sapien prosthetic, stented (TAVR) valve present in the  aortic position.   Pulmonic Valve: The pulmonic valve was not  well visualized. Pulmonic valve  regurgitation is trivial.   Aorta: The aortic root was not well visualized.   Venous: The inferior vena cava is normal in size with greater than 50%  respiratory variability, suggesting right atrial pressure of 3 mmHg.   IAS/Shunts: The atrial septum is grossly normal.    CT cardiac  FINDINGS: Aortic Valve: 29 mm Sapien 3 valve in aortic position Unable to assess calcium score due to stent struts. The stent frame itself is funneled shape being larger at the annulus and smaller in the sinuses Part of   This is because dense calcium behind the right cusp The leaflets are thickened with small nidus of calcium noted at the tips. The thickening does not look classic for HALT/HAM and may be residua from SBE   The left cusp is particularly thickened and may have some small vestiges of vegetation remaining The planimetry AVA is 2.2 cm2   The LM originates above the stent struts The RCA originates at the superior margin   VTC RCA 6.8   VTC LM 5.71   Aorta: Dilated ascending thoracic aorta 4.4 cm with normal arch vessels and moderate calcific atherosclerosis   Sino-tubular Junction:   Aortic Arch: 2.3 cm   Descending Thoracic Aorta: 2.4 cm   IMPRESSION: 1. 29 mm Sapien 3 valve in good position with no obvious PVL   2. Thickened leaflets with calcium nidus particularly left cusp with possible residual vegetation. This is not classic appearance of HALT/HAM but rather post SBE   3.  Dilated ascending thoracic aorta 4.4 cm   4. Funneled appearance to stent structure due to bulky calcium behind the right cusp   5. LM above the superior stent border and RCA with VCC seems ok for valve in valve procedure  6. Planimetry AVA 2.2 cm2 and with improvement in gradients and lack of classic HALT/HAM do not think anticoagulation would be beneficial   Jenkins Rouge   Electronically Signed: By: Jenkins Rouge M.D. On: 09/12/2021 13:28 Laboratory  Data:  High Sensitivity Troponin:  No results for input(s): "TROPONINIHS" in the last 720 hours.   ChemistryNo results for input(s): "NA", "K", "CL", "CO2", "GLUCOSE", "BUN", "CREATININE", "CALCIUM", "MG", "GFRNONAA", "GFRAA", "ANIONGAP" in the last 168 hours.  No results for input(s): "PROT", "ALBUMIN", "AST", "ALT", "ALKPHOS", "BILITOT" in the last 168 hours. Lipids No results for input(s): "CHOL", "TRIG", "HDL", "LABVLDL", "LDLCALC", "CHOLHDL" in the last 168 hours.  HematologyNo results for input(s): "WBC", "RBC", "HGB", "HCT", "MCV", "MCH", "MCHC", "RDW", "PLT" in the last 168 hours. Thyroid No results for input(s): "TSH", "FREET4" in the last 168 hours.  BNPNo results for input(s): "BNP", "PROBNP" in the last 168 hours.  DDimer No results for input(s): "DDIMER" in the last 168 hours.   Radiology/Studies:  No results found.   Assessment and Plan:   Cardiac arrest with VT and respiratory arrest. Now intubated.  Per CCM AKI with elevated Cr and hyperkalemia per CCM.  Severe AS with hx of endocarditis and on suppressive ABX.  Has not been candidate for redo surgery Severe COPD per CCM  Poor dentition has not been able to have teeth pulled yet. Hx of ascending thoracic aneurysm.4.8 cm 08/2021    Risk Assessment/Risk Scores:        New York Heart Association (NYHA) Functional Class NYHA Class IV        For questions or updates, please contact [facility] Please consult [email or web address] for contact info under    Signed, Cecilie Kicks, NP  03/11/2022 4:49 PM   I have seen and examined the patient along with Cecilie Kicks, NP .  I have reviewed the chart, notes and new data.  I agree with PA/NP's note.  Key new complaints: She is intubated and sedated, critically ill.  On intravenous pressors.  History obtained from chart. Key examination changes:   General: Morbidly obese.  Generalized anasarca.  Intubated, sedated, unresponsive Head: no evidence of trauma, PERRL, EOMI, no  exophtalmos or lid lag, no myxedema, no xanthelasma; normal ears, nose and oropharynx Neck: Unable to estimate jugular venous pulsations ; low volume, delayed carotid pulses Chest: Bilateral rhonchi without audible wheezes or rales Cardiovascular: Regular rate and rhythm with frequent ectopic beats, systolic murmurs are heard in the aortic focus, left lower sternal border and apex, no diastolic murmurs heard Abdomen: Appears distended/obese, no bowel sounds Extremities: Generalized edema Neurological: Unresponsive Psych: Unable to assess  Key new findings / data: Echocardiogram preliminary review while being performed in the ED.  LVEF is now severely depressed at 20-25% with global hypokinesis.  Despite this, the aortic valve gradients are still markedly elevated with a peak velocity over 4 m/s consistent with critical aortic stenosis.  The aortic jet is mid peaking.  There is mild aortic insufficiency.  There is also severe central mitral insufficiency and severe tricuspid insufficiency.  The right ventricle is also dilated and severely depressed.  There is no pericardial effusion. ECG shows supraventricular tachycardia, probably sinus tachycardia with first-degree AV block, cannot exclude ectopic atrial tachycardia.  During her echocardiogram there is clear evidence of sinus rhythm with frequent PACs.  PLAN: Unfortunately, I do not think there is anything else we can do to help this patient. She has critical aortic stenosis due to chronic, still active endocarditis in a  bioprosthetic TAVR valve, despite previous treatment with intravenous antibiotics and chronic suppressive antibiotics.   She now has cardiogenic shock, acute renal failure with hyperkalemia and had VT/VF arrest. She is not a candidate for surgical or percutaneous interventions on the aortic valve. Recommend comfort care. Briefly discussed by phone with her primary cardiologist.  He agrees there are no heroic measures that would  help her.  Thurmon Fair, MD, [facility] [phone] 03/18/2022, 6:36 PM

## 2022-02-24 NOTE — Procedures (Signed)
Central Venous Catheter Insertion Procedure Note  Cathy Moody  [id number]  29-Dec-1950  Date:03/21/2022  Time:6:33 PM   Provider Performing:[facility] Neuroth Judie Petit Pecola Leisure   Procedure: Insertion of Non-tunneled Central Venous [phone]) with US guidance (56256)   Indication(s) Medication administration and Difficult access  Consent Risks of the procedure as well as the alternatives and risks of each were explained to the patient and/or caregiver.  Consent for the procedure was obtained and is signed in the bedside chart  Anesthesia Topical only with 1% lidocaine   Timeout Verified patient identification, verified procedure, site/side was marked, verified correct patient position, special equipment/implants available, medications/allergies/relevant history reviewed, required imaging and test results available.  Sterile Technique Maximal sterile technique including full sterile barrier drape, hand hygiene, sterile gown, sterile gloves, mask, hair covering, sterile ultrasound probe cover (if used).  Procedure Description Area of catheter insertion was cleaned with chlorhexidine and draped in sterile fashion.  With real-time ultrasound guidance a central venous catheter was placed into the left internal jugular vein. Nonpulsatile blood flow and easy flushing noted in all ports. The catheter was sutured in place and sterile dressing applied.  Complications/Tolerance None; patient tolerated the procedure well. Chest X-ray is ordered to verify placement for internal jugular or subclavian cannulation.   Chest x-ray is not ordered for femoral cannulation.  EBL Minimal  Specimen(s) None  Tim Lair, [place] [facility] 03/08/2022 6:33 PM  Please see [email or web address] for pager details.  From 7A-7P if no response, please call [phone] After hours, please call ELink [phone]

## 2022-02-24 NOTE — Progress Notes (Signed)
eLink Physician-Brief Progress Note Patient Name: Cathy Moody DOB: 07-29-1951 MRN: [id number]   Date of Service  2022-03-10  HPI/Events of Note  71/F with hx of asthma/COPD, CHF, AF, thoracic AAA, brought in via EMS due to dyspnea and edema x 1 week.  Pt reportedly missed doses of lasix. Pt had runs of VT noted with cardioversion x 2 into sinus tachycardia. Pt intubated and sedated.  Pt started on pressors for hypotension.   BP 89/79, HR 86, O2 sats 90% Pt is intubated and sedated.   Rest of data reviewed.  H&H 13.9/44.5, platelets 160 K 6.7, crea 2.10 (baseline 1.08) INR 2.0 CXR L IJ in place, no pneumothorax.  Cardiomegaly without overt edema.  Possible trace right pleural effusion.  Lactic acid 8.2 Troponin 275 7.177/27.5  eICU Interventions  VT s/p cardioversion Acute hypoxic respiratory failure Acute CHF AKI Hyperkalemia Lactic acidosis Elevated troponin, likely from demand ischemia Hx of COPD Hx of TAVR endocarditis Hx of thoracic AAA   Continue empiric broad spectrum antibiotics.  Continue levophed to maintain MAP >65.  Medical management of K.  Repeat K tonight.  Continue to trend troponin, lactate, BMP.  Reinsert arterial line.  Continue HCO3 gtt.  Heparin for DVT prophylaxis.  Protonix for GI prophylaxis.       Intervention Category Evaluation Type: New Patient Evaluation  Larinda Buttery 03/10/22, 7:22 PM

## 2022-02-24 NOTE — Progress Notes (Signed)
E-link RN notified of critical ABG results.

## 2022-02-24 NOTE — Progress Notes (Signed)
Date and time results received: Mar 03, 2022 7:54 PM  (use smartphrase ".now" to insert current time)  Test: Glucose & Troponin Critical Value: Glucose 530; Troponin 401  Name of Provider Notified: E-link  Orders Received? Or Actions Taken?: Awaiting orders

## 2022-02-24 NOTE — Procedures (Signed)
Arterial Catheter Insertion Procedure Note  DETTA MELLIN  [id number]  27-Nov-1950  Date:03-16-2022  Time:6:45 PM    Provider Performing: Lorin Glass    Procedure: Insertion of Arterial Line (46803) with US guidance ([id number])   Indication(s) Blood pressure monitoring and/or need for frequent ABGs  Consent Unable to obtain consent due to emergent nature of procedure.  Anesthesia None   Time Out Verified patient identification, verified procedure, site/side was marked, verified correct patient position, special equipment/implants available, medications/allergies/relevant history reviewed, required imaging and test results available.   Sterile Technique Maximal sterile technique including full sterile barrier drape, hand hygiene, sterile gown, sterile gloves, mask, hair covering, sterile ultrasound probe cover (if used).   Procedure Description Area of catheter insertion was cleaned with chlorhexidine and draped in sterile fashion. With real-time ultrasound guidance an arterial catheter was placed into the right radial artery.  Appropriate arterial tracings confirmed on monitor.     Complications/Tolerance None; patient tolerated the procedure well.   EBL Minimal   Specimen(s) None

## 2022-02-24 NOTE — Consult Note (Signed)
    STEMI CONSULT  Pt called EMS for swelling she has not had lasix for some time.  She has hx of TAVR.  EMS arrived and pt was confused and then had VT was shocked X 2 to ST - she has had assistance with breathing on way in.  Here in in ER had CPR briefly.  Dr. Allyson Sabal reviewed EKG a fib vs atrial tach with LBBB, hx a fib  Not STEMI candidate at this time.  No Acute sT changes. We will see for cards consult.

## 2022-02-24 NOTE — Progress Notes (Addendum)
eLink Physician-Brief Progress Note Patient Name: Cathy Moody DOB: 06-27-1951 MRN: [id number]   Date of Service  2022/03/01  HPI/Events of Note  Notified of abnormal labs: K 6.1, glucose 530, troponin 401.   eICU Interventions  Give kayexalate via tube.  Start on insulin sliding scale.  Change HCO3 gtt diluent to sterile water.  Continue to trend troponin.       Intervention Category Intermediate Interventions: Electrolyte abnormality - evaluation and management;Hyperglycemia - evaluation and treatment  Larinda Buttery 2022/03/01, 8:02 PM  9:04 PM Lokelma was not yet given.  Kayexalate given as per oder.   Plan> Lokelma will be discontinued.

## 2022-02-24 NOTE — Progress Notes (Signed)
  Echocardiogram 2D Echocardiogram has been performed.  Shona Simpson 03/05/2022, 6:48 PM

## 2022-02-24 NOTE — Progress Notes (Signed)
Pharmacy Antibiotic Note  Cathy Moody is a 71 y.o. female admitted on 03/07/2022 presenting in respiratory distress.  Pharmacy has been consulted for vancomycin and zosyn dosing.  Plan: Vancomycin 1500 mg IV x 1, then 750 mg IV q 48h (eAUC 449, SCr 2.1) Zosyn 3.375 g IV q 8 hours (extended 4h infusion) Monitor renal function, Cx and clinical progression to narrow Vancomycin levels as needed  Height: 5' (152.4 cm) Weight: 86.6 kg (190 lb 14.7 oz) IBW/kg (Calculated) : 45.5  No data recorded.  Recent Labs  Lab 02/26/2022 1738  CREATININE 2.10*    Estimated Creatinine Clearance: 24.4 mL/min (A) (by C-G formula based on SCr of 2.1 mg/dL (H)).    Allergies  Allergen Reactions   Iodinated Contrast Media Rash   Penicillin G Rash   Plavix [Clopidogrel Bisulfate] Rash    Daylene Posey, PharmD Clinical Pharmacist ED Pharmacist Phone # [phone] 03/06/2022 6:07 PM

## 2022-02-24 NOTE — Progress Notes (Signed)
eLink Physician-Brief Progress Note Patient Name: Cathy Moody DOB: March 04, 1951 MRN: [id number]   Date of Service  03/03/2022  HPI/Events of Note  7.192/54.7/254 on TV 360, rate 28, PEEP 5, 50%.   Pt BP 94/68 on levophed at 24 mcg/min.   eICU Interventions  Add vasopressin.  Increase minute ventilation, tidal volume to 400.  Titrate FiO2 down to 40%.      Intervention Category Intermediate Interventions: Diagnostic test evaluation  Larinda Buttery 03/13/2022, 11:32 PM

## 2022-02-24 NOTE — ED Notes (Signed)
Have attempted BP with manual and doppler, MD aware and pressors started. RT remains at bedside and tube pulled back following cxr

## 2022-02-24 NOTE — Progress Notes (Signed)
ETT pulled back from 23cm at the lip to 21cm per MD verbal order. This is per post intubation CXR.

## 2022-02-24 NOTE — ED Triage Notes (Addendum)
Patient with swelling and sob with edema x 1 week after beach trip. Patient arrived being bagged and mottling noted to abdomen. Has had runs of v-tach and cardioverted x 1 at 100J.  Patient with increased confusion. Has been on chronic antibiotics since December for oral infection. Patient never complained of cp.STEMI activated pta and cardiology here and currently no plans to cath. RT at bedside for airway and plans to intubate. Patient restless and moving all extremities.

## 2022-02-24 NOTE — Progress Notes (Signed)
Patient transported from ED to room 2M02 without complications.

## 2022-02-24 NOTE — ED Provider Notes (Signed)
[facility] EMERGENCY DEPARTMENT Provider Note   CSN: [id number] Arrival date & time: 03/16/2022  1631     History {Add pertinent medical, surgical, social history, OB history to HPI:1} No chief complaint on file.   Cathy Moody is a 71 y.o. female.  HPI        Home Medications Prior to Admission medications   Medication Sig Start Date End Date Taking? Authorizing Provider  albuterol (VENTOLIN HFA) 108 (90 Base) MCG/ACT inhaler TAKE 2 PUFFS BY MOUTH EVERY 6 HOURS AS NEEDED FOR WHEEZE OR SHORTNESS OF BREATH 01/28/22   Kathlynn Grate, DO  ALPRAZolam Prudy Feeler) 0.25 MG tablet Take 1 tablet (0.25 mg total) by mouth 2 (two) times daily as needed for anxiety. 06/11/21   [name], MD  amoxicillin (AMOXIL) 500 MG capsule Take 1 capsule (500 mg total) by mouth 2 (two) times daily. 01/28/22 07/27/22  Kathlynn Grate, DO  aspirin EC 81 MG EC tablet Take 1 tablet (81 mg total) by mouth daily. 08/13/13   Barrett, Erin R, PA-C  fluticasone furoate-vilanterol (BREO ELLIPTA) 100-25 MCG/ACT AEPB Inhale 1 puff into the lungs daily. 06/11/21   [name], MD  ibuprofen (ADVIL) 200 MG tablet Take 200 mg by mouth every 6 (six) hours as needed for moderate pain or headache.    [provider]  levothyroxine (SYNTHROID) 112 MCG tablet TAKE 1 TABLET BY MOUTH EVERY DAY BEFORE BREAKFAST 10/14/21   [name], MD  metoprolol tartrate (LOPRESSOR) 25 MG tablet Take 2 tablets (50 mg total) by mouth as directed. Please take 50mg  (two tabs) before your CT scan to lower HR. Take extra 25 mg HR remains >80 bpm 09/04/21 09/04/22  11/03/22, PA-C      Allergies    Iodinated contrast media, Penicillin g, and Plavix [clopidogrel bisulfate]    Review of Systems   Review of Systems  Physical Exam Updated Vital Signs Pulse (!) 27   Resp (!) 22   SpO2 (!) 85%  Physical Exam  ED Results / Procedures / Treatments   Labs (all labs ordered are listed, but only  abnormal results are displayed) Labs Reviewed  SARS CORONAVIRUS 2 BY RT PCR  COMPREHENSIVE METABOLIC PANEL  CBC  BRAIN NATRIURETIC PEPTIDE  I-STAT CHEM 8, ED  I-STAT ARTERIAL BLOOD GAS, ED  TYPE AND SCREEN  TROPONIN I (HIGH SENSITIVITY)    EKG None  Radiology No results found.  Procedures Procedure Name: Intubation Date/Time: 02/27/2022 7:08 PM  Performed by: 04/27/2022, MDPre-anesthesia Checklist: Patient identified, Timeout performed, Suction available and Patient being monitored Oxygen Delivery Method: Simple face mask Preoxygenation: Pre-oxygenation with 100% oxygen Induction Type: Rapid sequence Ventilation: Mask ventilation without difficulty Laryngoscope Size: Glidescope and 3 Grade View: Grade II Tube size: 8.0 mm Number of attempts: 1 Airway Equipment and Method: Video-laryngoscopy Placement Confirmation: ETT inserted through vocal cords under direct vision, Positive ETCO2, CO2 detector and Breath sounds checked- equal and bilateral Secured at: 23 cm Tube secured with: ETT holder      {Document cardiac monitor, telemetry assessment procedure when appropriate:1}  Medications Ordered in ED Medications  amiodarone (NEXTERONE) 1.8 mg/mL load via infusion 150 mg (150 mg Intravenous Bolus from Bag 02/27/2022 1640)    Followed by  amiodarone (NEXTERONE PREMIX) 360-4.14 MG/200ML-% (1.8 mg/mL) IV infusion (60 mg/hr Intravenous New Bag/Given 03/16/2022 1648)    Followed by  amiodarone (NEXTERONE PREMIX) 360-4.14 MG/200ML-% (1.8 mg/mL) IV infusion (has no administration in time range)  etomidate (AMIDATE) injection (20 mg Intravenous Given 03/15/2022 1647)  rocuronium (ZEMURON) injection (100 mg Intravenous Given 03/13/2022 1648)  methylPREDNISolone sodium succinate (SOLU-MEDROL) 125 mg/2 mL injection 125 mg (has no administration in time range)  albuterol (PROVENTIL) (2.5 MG/3ML) 0.083% nebulizer solution 5 mg (has no administration in time range)  ipratropium (ATROVENT)  nebulizer solution 0.5 mg (has no administration in time range)  fentaNYL (SUBLIMAZE) 100 MCG/2ML injection (has no administration in time range)    ED Course/ Medical Decision Making/ A&P                           Medical Decision Making Amount and/or Complexity of Data Reviewed Labs: ordered. Radiology: ordered.  Risk Prescription drug management. Decision regarding hospitalization.   ***  {Document critical care time when appropriate:1} {Document review of labs and clinical decision tools ie heart score, Chads2Vasc2 etc:1}  {Document your independent review of radiology images, and any outside records:1} {Document your discussion with family members, caretakers, and with consultants:1} {Document social determinants of health affecting pt's care:1} {Document your decision making why or why not admission, treatments were needed:1} Final Clinical Impression(s) / ED Diagnoses Final diagnoses:  None    Rx / DC Orders ED Discharge Orders     None

## 2022-02-24 NOTE — H&P (Addendum)
NAME:  Cathy Moody, MRN:  [id number], DOB:  1951/07/24, LOS: 0 ADMISSION DATE:  03/18/2022 CONSULTATION DATE:  03/18/2022 REFERRING MD:  Denton Lank, CHIEF COMPLAINT:  SOB, STEMI   History of Present Illness:  71 year old woman who presented to [facility] 7/3 via EMS with edema and SOB x 1 week after a beach trip, missed Lasix doses. Runs of VT noted with cardioversion x 2 into ST (100J). Brief CPR in ED. PMHx significant for asthma/COPD, CHF, AF, AS (s/p TAVR), c/f AV endocarditis, thoracic AAA, EtOH abuse.  Patient is intubated and sedated; history is obtained from chart review. Patient recently noted to have increased confusion. On long-term antibiotics since 07/2021 for oral infection.  On arrival to ED, patient was being bagged via BVM with mottling noted to abdomen. Intubated. Noted to be moving all 4 extremities prior to intubation. Initial concern for STEMI; Cardiology consulted and reviewed EKG with AF/atrial tach and LBBB, no acute ST changes. Pressors initiated for hypotension.   Pertinent Medical History:   Past Medical History:  Diagnosis Date   Acute CHF (HCC)    [name] 07/29/2013   Alcohol abuse    /notes 07/29/2013   Aortic stenosis    Asthma    COPD (chronic obstructive pulmonary disease) (HCC)    Endocarditis of prosthetic aortic valve (HCC) 05/2021   Hypotension    Hypothyroidism    Morbid obesity (HCC)    S/P TAVR (transcatheter aortic valve replacement)    Thoracic ascending aortic aneurysm (HCC)    4.8 asending TAA 09/12/21 CTA   Significant Hospital Events: Including procedures, antibiotic start and stop dates in addition to other pertinent events   7/3 -   Interim History / Subjective:  PCCM consulted for ICU admission  Objective:  Pulse (!) 27, resp. rate (!) 22, SpO2 (!) 85 %.       No intake or output data in the 24 hours ending 03/01/2022 1654 There were no vitals filed for this visit.  Physical Examination: Mottled unresponsive woman on vent Ext  lukewarm Diffuse anasarca Abdomen soft Heart sounds distant EF down on bedside echo  Istat labs with AKI and hyperkalemia CXR edema  Resolved Hospital Problem List:    Assessment & Plan:  Vtach IHCA preceded by respiratory symptoms in context of gradually worsening CHF symptoms.  Most c/w decompensated HF.  Sepsis in differential as she is spiking temp.  No STEMI per cards review. Acute hypoxemic respiratory failure Acute kidney failure with hyperkalemia and acidemia Post arrest encephalopathy Hx COPD/asthma/alcohol abuse/TAVR/hypothyroid Hx TAVR endocarditis Hx Thoracic aneurysm conservatively managed  - Cultures, COVID - Vanc/zosyn - Vent support, VAP prevention bundle - Levophed for MAP 65 - Check coox and consider inotropes - If nonclearing lactate consider pan-scan - Bicarb drip - F/u admit labs, temporizing measures for K (ca, insulin, lokelma).  If confirmed and persistent on labs need to consider HD - Trend lactate, trops, check echo - Amio, replete electrolytes PRN - Utox, ethanol level - Brovana/yupelri, not hearing wheezing on my exam holding further steroids - Guarded prognosis  Best Practice: (right click and "Reselect all SmartList Selections" daily)   Diet/type: NPO DVT prophylaxis: prophylactic heparin  GI prophylaxis: PPI Lines: yes and it is still needed Foley:  Yes, and it is still needed Code Status:  full code Last date of multidisciplinary goals of care discussion [pending]  Labs:  CBC: No results for input(s): "WBC", "NEUTROABS", "HGB", "HCT", "MCV", "PLT" in the last 168 hours.  Basic Metabolic Panel: No  results for input(s): "NA", "K", "CL", "CO2", "GLUCOSE", "BUN", "CREATININE", "CALCIUM", "MG", "PHOS" in the last 168 hours. GFR: CrCl cannot be calculated (Patient's most recent lab result is older than the maximum 21 days allowed.). No results for input(s): "PROCALCITON", "WBC", "LATICACIDVEN" in the last 168 hours.  Liver Function  Tests: No results for input(s): "AST", "ALT", "ALKPHOS", "BILITOT", "PROT", "ALBUMIN" in the last 168 hours. No results for input(s): "LIPASE", "AMYLASE" in the last 168 hours. No results for input(s): "AMMONIA" in the last 168 hours.  ABG:    Component Value Date/Time   PHART 7.351 08/09/2013 2105   PCO2ART 64.6 (HH) 08/09/2013 2105   PO2ART 74.0 (L) 08/09/2013 2105   HCO3 36.0 (H) 08/09/2013 2105   TCO2 38 08/10/2013 1724   O2SAT 94.0 08/09/2013 2105    Coagulation Profile: No results for input(s): "INR", "PROTIME" in the last 168 hours.  Cardiac Enzymes: No results for input(s): "CKTOTAL", "CKMB", "CKMBINDEX", "TROPONINI" in the last 168 hours.  HbA1C: Hgb A1c MFr Bld  Date/Time Value Ref Range Status  05/30/2021 04:57 AM 6.3 (H) 4.8 - 5.6 % Final    Comment:    (NOTE) Pre diabetes:          5.7%-6.4%  Diabetes:              >6.4%  Glycemic control for   <7.0% adults with diabetes   08/10/2017 03:56 PM 6.1 (H) <5.7 % of total Hgb Final    Comment:    For someone without known diabetes, a hemoglobin  A1c value between 5.7% and 6.4% is consistent with prediabetes and should be confirmed with a  follow-up test. . For someone with known diabetes, a value <7% indicates that their diabetes is well controlled. A1c targets should be individualized based on duration of diabetes, age, comorbid conditions, and other considerations. . This assay result is consistent with an increased risk of diabetes. . Currently, no consensus exists regarding use of hemoglobin A1c for diagnosis of diabetes for children. .    CBG: No results for input(s): "GLUCAP" in the last 168 hours.  Review of Systems:   Patient is encephalopathic and/or intubated. Therefore history has been obtained from chart review.   Past Medical History:  She,  has a past medical history of Acute CHF ([place]), Alcohol abuse, Aortic stenosis, Asthma, COPD (chronic obstructive pulmonary disease) ([place]),  Endocarditis of prosthetic aortic valve ([place]) (05/2021), Hypotension, Hypothyroidism, Morbid obesity ([place]), S/P TAVR (transcatheter aortic valve replacement), and Thoracic ascending aortic aneurysm ([place]).   Surgical History:   Past Surgical History:  Procedure Laterality Date   INTRAOPERATIVE TRANSESOPHAGEAL ECHOCARDIOGRAM N/A 08/09/2013   Procedure: INTRAOPERATIVE TRANSESOPHAGEAL ECHOCARDIOGRAM;  Surgeon: Gaye Pollack, MD;  Location: [place] OR;  Service: Open Heart Surgery;  Laterality: N/A;   LEFT AND RIGHT HEART CATHETERIZATION WITH CORONARY ANGIOGRAM N/A 08/01/2013   Procedure: LEFT AND RIGHT HEART CATHETERIZATION WITH CORONARY ANGIOGRAM;  Surgeon: Sinclair Grooms, MD;  Location: [facility] CATH LAB;  Service: Cardiovascular;  Laterality: N/A;   TEE WITHOUT CARDIOVERSION N/A 05/31/2021   Procedure: TRANSESOPHAGEAL ECHOCARDIOGRAM (TEE);  Surgeon: Berniece Salines, DO;  Location: [facility] ENDOSCOPY;  Service: Cardiovascular;  Laterality: N/A;   TRANSCATHETER AORTIC VALVE REPLACEMENT, TRANSAPICAL Left 08/09/2013   Procedure: TRANSCATHETER AORTIC VALVE REPLACEMENT, TRANSAPICAL;  Surgeon: Gaye Pollack, MD;  Location: [place] OR;  Service: Open Heart Surgery;  Laterality: Left;   Social History:   reports that she quit smoking about 8 years ago. Her smoking use included cigarettes. She  has never used smokeless tobacco. She reports current alcohol use. She reports that she does not use drugs.   Family History:  Her family history includes Diabetes in her paternal grandmother; Liver disease in her son.   Allergies: Allergies  Allergen Reactions   Iodinated Contrast Media Rash   Penicillin G Rash   Plavix [Clopidogrel Bisulfate] Rash    Home Medications: Prior to Admission medications   Medication Sig Start Date End Date Taking? Authorizing Provider  albuterol (VENTOLIN HFA) 108 (90 Base) MCG/ACT inhaler TAKE 2 PUFFS BY MOUTH EVERY 6 HOURS AS NEEDED FOR WHEEZE OR SHORTNESS OF BREATH 01/28/22   Kathlynn Grate, DO   ALPRAZolam Prudy Feeler) 0.25 MG tablet Take 1 tablet (0.25 mg total) by mouth 2 (two) times daily as needed for anxiety. 06/11/21   [name], MD  amoxicillin (AMOXIL) 500 MG capsule Take 1 capsule (500 mg total) by mouth 2 (two) times daily. 01/28/22 07/27/22  Kathlynn Grate, DO  aspirin EC 81 MG EC tablet Take 1 tablet (81 mg total) by mouth daily. 08/13/13   Barrett, Erin R, PA-C  fluticasone furoate-vilanterol (BREO ELLIPTA) 100-25 MCG/ACT AEPB Inhale 1 puff into the lungs daily. 06/11/21   [name], MD  ibuprofen (ADVIL) 200 MG tablet Take 200 mg by mouth every 6 (six) hours as needed for moderate pain or headache.    [provider]  levothyroxine (SYNTHROID) 112 MCG tablet TAKE 1 TABLET BY MOUTH EVERY DAY BEFORE BREAKFAST 10/14/21   [name], MD  metoprolol tartrate (LOPRESSOR) 25 MG tablet Take 2 tablets (50 mg total) by mouth as directed. Please take 50mg  (two tabs) before your CT scan to lower HR. Take extra 25 mg HR remains >80 bpm 09/04/21 09/04/22  11/03/22, PA-C    Critical care time: 45 min  Janetta Hora MD  Please see [email or web address] for pager details.  From 7A-7P if no response, please call [phone] After hours, please call ELink [phone]

## 2022-02-25 ENCOUNTER — Inpatient Hospital Stay (HOSPITAL_COMMUNITY): Payer: PPO

## 2022-02-25 DIAGNOSIS — J9601 Acute respiratory failure with hypoxia: Secondary | ICD-10-CM | POA: Diagnosis not present

## 2022-02-25 LAB — POCT I-STAT 7, (LYTES, BLD GAS, ICA,H+H)
Acid-base deficit: 8 mmol/L — ABNORMAL HIGH (ref 0.0–2.0)
Bicarbonate: 20.7 mmol/L (ref 20.0–28.0)
Calcium, Ion: 1.13 mmol/L — ABNORMAL LOW (ref 1.15–1.40)
HCT: 48 % — ABNORMAL HIGH (ref 36.0–46.0)
Hemoglobin: 16.3 g/dL — ABNORMAL HIGH (ref 12.0–15.0)
O2 Saturation: 83 %
Patient temperature: 99
Potassium: 5.3 mmol/L — ABNORMAL HIGH (ref 3.5–5.1)
Sodium: 141 mmol/L (ref 135–145)
TCO2: 22 mmol/L (ref 22–32)
pCO2 arterial: 51.6 mmHg — ABNORMAL HIGH (ref 32–48)
pH, Arterial: 7.213 — ABNORMAL LOW (ref 7.35–7.45)
pO2, Arterial: 58 mmHg — ABNORMAL LOW (ref 83–108)

## 2022-02-25 LAB — COOXEMETRY PANEL
Carboxyhemoglobin: 1.5 % (ref 0.5–1.5)
Methemoglobin: <0.7 % (ref 0.0–1.5)
O2 Saturation: 71.4 %
Total hemoglobin: 15.5 g/dL (ref 12.0–16.0)

## 2022-02-25 LAB — SARS CORONAVIRUS 2 BY RT PCR: SARS Coronavirus 2 by RT PCR: NEGATIVE

## 2022-02-25 LAB — GLUCOSE, CAPILLARY: Glucose-Capillary: 76 mg/dL (ref 70–99)

## 2022-02-25 LAB — POTASSIUM: Potassium: 5.5 mmol/L — ABNORMAL HIGH (ref 3.5–5.1)

## 2022-02-25 MED ORDER — ACETAMINOPHEN 325 MG PO TABS: 650.0000 mg | ORAL_TABLET | Freq: Four times a day (QID) | ORAL | Status: DC | PRN

## 2022-02-25 MED ORDER — ACETAMINOPHEN 650 MG RE SUPP: 650.0000 mg | Freq: Four times a day (QID) | RECTAL | Status: DC | PRN

## 2022-02-25 MED ORDER — EPINEPHRINE HCL 5 MG/250ML IV SOLN IN NS: 0.5000 ug/min | INTRAVENOUS | Status: DC

## 2022-02-25 MED ORDER — EPINEPHRINE HCL 5 MG/250ML IV SOLN IN NS
INTRAVENOUS | Status: AC
  Administered 2022-02-25: 5 mg
  Filled 2022-02-25: qty 250

## 2022-02-25 MED ORDER — GLYCOPYRROLATE 1 MG PO TABS: 1.0000 mg | ORAL_TABLET | ORAL | Status: DC | PRN

## 2022-02-25 MED ORDER — GLYCOPYRROLATE 0.2 MG/ML IJ SOLN: 0.2000 mg | INTRAMUSCULAR | Status: DC | PRN

## 2022-02-25 MED ORDER — FUROSEMIDE 10 MG/ML IJ SOLN
8.0000 mg/h | INTRAVENOUS | Status: DC
  Filled 2022-02-25: qty 20

## 2022-02-25 MED ORDER — POLYVINYL ALCOHOL 1.4 % OP SOLN: 1.0000 [drp] | Freq: Four times a day (QID) | OPHTHALMIC | Status: DC | PRN

## 2022-02-25 MED ORDER — SODIUM CHLORIDE 0.9 % IV SOLN: INTRAVENOUS | Status: DC

## 2022-02-25 MED ORDER — MILRINONE LACTATE IN DEXTROSE 20-5 MG/100ML-% IV SOLN
0.2500 ug/kg/min | INTRAVENOUS | Status: DC
  Administered 2022-02-25: 0.25 ug/kg/min via INTRAVENOUS
  Filled 2022-02-25: qty 100

## 2022-03-02 LAB — CULTURE, BLOOD (ROUTINE X 2)
Culture: NO GROWTH
Special Requests: ADEQUATE

## 2022-03-03 ENCOUNTER — Ambulatory Visit: Payer: PPO

## 2022-03-05 ENCOUNTER — Ambulatory Visit: Payer: PPO

## 2022-03-25 NOTE — Progress Notes (Signed)
Patient's family decided to make patient comfort care after speaking with [name], NP at bedside. Patient extubated at 0415 with Myriam Jacobson, RT. Monitor showed asystole at 0426. At this time, absence of heart sounds was verified with this RN and Christene Lye RN. Time of death 85. [facility] notified. Patient's belongings were returned to son Kathlene November at time of admission and were as follows: pair of crocs, one pair of pants, 5 gold bracelets, and 1 gold necklace. Patient transported to morgue without incidence. Patient placement notified.

## 2022-03-25 NOTE — Progress Notes (Signed)
eLink Physician-Brief Progress Note Patient Name: Cathy Moody DOB: [birth year] MRN: [id number]   Date of Service  03/04/2022  HPI/Events of Note  Notified of oliguria. Urine output only 15cc.  Bladder scan showing no urine.   eICU Interventions  Continue to monitor for now. Pt is getting HCO3 gtt.      Intervention Category Intermediate Interventions: Oliguria - evaluation and management  Larinda Buttery 03/23/2022, 12:48 AM

## 2022-03-25 NOTE — Progress Notes (Signed)
PCCM INTERVAL PROGRESS NOTE  Patient'ts family at bedside. Worsening shock despite multiple pressors. Worsening vent requirements. Upon entering the room and introducing myself to Cathy Moody, who I had previously spoken to on the phone, he very clearly told me to keep her comfortable. I described this meant starting medications targeted towards total comfort and discontinuing life support. He and family understand.   Full comfort measures DNR Continue fentanyl infusion and will increase infusion and bolus doses.  RT to extubate per comfort protocol   [name], AGACNP-BC [facility]  See Amion for personal pager PCCM on call pager [phone] until 7pm. Please call Elink 7p-7a. [phone]  03/20/2022 4:03 AM

## 2022-03-25 NOTE — Progress Notes (Signed)
Pt extubated without incidence. Family at bedside.

## 2022-03-25 NOTE — Progress Notes (Addendum)
[facility] PCCM PROGRESS NOTE  S: Called to bedside to evaluate patient for worsening shock and dusky extremity distal to arterial line placed earlier in the shift.   Briefly this is a 71 year old female with PMH significant for CHF, AVR, endocarditis, etoh, medical non-compliance. She presented 7/3 with SOB and edema x 1 week. Has not been taking lasix. In extremis on presentation. VT runs. Cardioverted. Brief CPR in ED. Intubated, pressors, admitted to ICU. Cards consulted. No STEMI.   Now over the course of the evening she has had worsening shock. Echo showing EF less than 20% which is new from echo in April (50-55%). Regurgitation of the prosthetic aortic valve is worse as well from mild to moderate. Severe MR. CVP 18  O: BP (!) 81/69 (BP Location: Left Arm)   Pulse 93   Temp 99 F (37.2 C) (Oral)   Resp (!) 28   Ht 5' (1.524 m)   Wt 86.6 kg   SpO2 (!) 86%   BMI 37.29 kg/m   General:  Sedated on vent Neuro:  Sedated on vent HEENT:  [place]/AT, neck supple Cardiovascular:  RRR Lungs:  Coarse throughout Abdomen:  Soft, non-distended Musculoskeletal:  No acute deformity. R hand cool and dusky with cyanotic nail beds.  Skin:  Intact, MMM  A/P:  Cardiogenic shock LVEF newly down to less than 20% with global HK. Moderate AI, Severe MR both known, but now worse. No STEMI per cardiology.  - check coox - check CVP - Levo and vaso continue - Start milrinone, lasix infusion - remove art line, distal hand is dusky and nail beds are blue. All other sites have been evaluated and none are amenable to catheter placement. Will need to rely on cuff pressures unfortunately, which have been somewhat inconsistent.  - Prognosis guarded. Son updated. DNR established.     [name], AGACNP-BC [facility]  See Amion for personal pager PCCM on call pager [phone] until 7pm. Please call Elink 7p-7a. [phone]  03/13/2022 1:09 AM

## 2022-03-25 NOTE — Progress Notes (Signed)
eLink Physician-Brief Progress Note Patient Name: Cathy Moody DOB: [birth year] MRN: [id number]   Date of Service  February 28, 2022  HPI/Events of Note  Notified of hypotension despite max dose of levophed and vasopressin.    eICU Interventions  Increase levophed up to 11mcg/min. Hold milrinone and lasix gtts.  Continue vasopressin.  Add epinephrine gtt.   Spoke to the patient's son Cathy Needle regarding worse condition.  Code status was changed  to DNR earlier in the evening.  Pt's son was made aware that she is requiring more and more pressors.  Cathy Needle is on his way to the hospital.      Intervention Category Major Interventions: Shock - evaluation and management  Larinda Buttery 02-28-22, 2:42 AM

## 2022-03-25 NOTE — Death Summary Note (Signed)
DEATH SUMMARY   Patient Details  Name: Cathy Moody MRN: [id number] DOB: 1950/12/01  Admission/Discharge Information   Admit Date:  March 08, 2022  Date of Death: Date of Death: 2022-03-09  Time of Death: Time of Death: 0426  Length of Stay: 1  Referring Physician: [name], MD   Reason(s) for Hospitalization  Acute systolic heart failure Acute hypoxemic respiratory failure Cardiorenal syndrome with hyperkalemia  Brief Hospital Course (including significant findings, care, treatment, and services provided and events leading to death)  71 year old woman who presented to [facility] 7/3 via EMS with edema and SOB x 1 week after a beach trip, missed Lasix doses. Runs of VT noted with cardioversion x 2 into ST (100J). Brief CPR in ED. PMHx significant for asthma/COPD, CHF, AF, AS (s/p TAVR), c/f AV endocarditis, thoracic AAA, EtOH abuse.   Patient is intubated and sedated; history is obtained from chart review. Patient recently noted to have increased confusion. On long-term antibiotics since 07/2021 for oral infection.   On arrival to ED, patient was being bagged via BVM with mottling noted to abdomen. Intubated. Noted to be moving all 4 extremities prior to intubation. Initial concern for STEMI; Cardiology consulted and reviewed EKG with AF/atrial tach and LBBB, no acute ST changes. Pressors initiated for hypotension.  Despite aggressive care including ventilator, pressors patient continued to decline.  Family meetings held and decision made to allow Cathy Moody to pass peacefully off life support.   Pertinent Labs and Studies  Significant Diagnostic Studies US RENAL  Result Date: 08-Mar-2022 CLINICAL DATA:  Acute kidney injury. EXAM: RENAL / URINARY TRACT ULTRASOUND COMPLETE COMPARISON:  CT a 09/12/2021 FINDINGS: Right Kidney: Renal measurements: 10.8 x 4.8 x 5.4 cm = volume: 147 mL. No hydronephrosis. Increased renal parenchymal echogenicity. Technically limited assessment of renal parenchyma,  allowing for this no focal lesion. No visualized stone. Left Kidney: Renal measurements: 8.9 x 4.5 x 4.5 cm = volume: 95 mL. Left renal atrophy is seen on prior CT. No hydronephrosis. Increased renal parenchymal echogenicity. Technically limited assessment of renal parenchyma, allowing for this no focal lesion. No visualized stone. Bladder: Not seen on the current exam. Other: None. IMPRESSION: 1. Increased bilateral renal parenchymal echogenicity consistent with chronic medical renal disease. No obstructive uropathy. 2. Chronic left renal atrophy. Electronically Signed   By: Narda Rutherford M.D.   On: Mar 08, 2022 22:51   DG Chest Port 1 View  Result Date: 03/08/2022 CLINICAL DATA:  Central line, V-tach EXAM: PORTABLE CHEST 1 VIEW COMPARISON:  03/08/22, 05/28/2021, CT 09/12/2021 FINDINGS: Endotracheal tube tip is about 4.4 cm superior to the carina. Esophageal tube tip below the diaphragm but incompletely visualized. Aortic valve prosthesis. Left-sided central venous catheter tip overlies the SVC. No visible pneumothorax. Considerable cardiomegaly without significant change. Possible small right effusion IMPRESSION: 1. Left IJ central venous catheter with tip over the SVC. No pneumothorax 2. Considerable cardiomegaly without overt edema. Possible trace right pleural effusion. Electronically Signed   By: Jasmine Pang M.D.   On: March 08, 2022 19:00   ECHOCARDIOGRAM LIMITED  Result Date: 03/08/22    ECHOCARDIOGRAM LIMITED REPORT   Patient Name:   Cathy Moody Date of Exam: 03/08/2022 Medical Rec #:  [id number]     Height:       60.0 in Accession #:    [id number]    Weight:       190.9 lb Date of Birth:  1950-09-14      BSA:  1.830 m Patient Age:    71 years      BP:           98/69 mmHg Patient Gender: F             HR:           85 bpm. Exam Location:  Inpatient Procedure: 2D Echo Indications:     Ventricular Tachycardia I47.2  History:         Patient has prior history of Echocardiogram examinations,  most                  recent 11/26/2021. Aortic Valve Disease; Arrythmias:Tachycardia.                  Cardiac arrest.. Endocarditis of prosthetic valve.  Sonographer:     Merrie Roof RDCS Referring Phys:  [id number] Candee Furbish Diagnosing Phys: Rudean Haskell MD IMPRESSIONS  1. Left ventricular ejection fraction, by estimation, is <20%. The left ventricle has severely decreased function. The left ventricle demonstrates global hypokinesis. Left ventricular diastolic function could not be evaluated.  2. Right ventricular systolic function is hyperdynamic. The right ventricular size is normal.  3. Severe mitral valve regurgitation. Mild mitral stenosis. Moderate mitral annular calcification.  4. Tricuspid valve regurgitation is severe.  5. Aortic valve regurgitation is mild to moderate. Procedure Date: 2014. Effective orifince area, by VTI measures 0.44 cm. Aortic valve mean gradient measures 41.0 mmHg. Aortic valve Vmax measures 4.51 m/s. Comparison(s): New drop in LVEF and multiple severe valve disease. Cardiology at bedside during exam. FINDINGS  Left Ventricle: Left ventricular ejection fraction, by estimation, is <20%. The left ventricle has severely decreased function. The left ventricle demonstrates global hypokinesis. Left ventricular diastolic function could not be evaluated. Right Ventricle: The right ventricular size is normal. Right ventricular systolic function is hyperdynamic. Left Atrium: Left atrial size was not assessed. Right Atrium: Right atrial size was not assessed. Mitral Valve: Moderate mitral annular calcification. Severe mitral valve regurgitation. Mild mitral valve stenosis. Tricuspid Valve: Tricuspid valve regurgitation is severe. Aortic Valve: 0.14. Aortic valve regurgitation is mild to moderate. Aortic valve mean gradient measures 41.0 mmHg. Aortic valve peak gradient measures 81.4 mmHg. Aortic valve area, by VTI measures 0.44 cm. There is a 29 mm Sapien prosthetic, stented (TAVR)  valve present in the aortic position. IAS/Shunts: The interatrial septum was not assessed. LEFT VENTRICLE PLAX 2D LVOT diam:     2.00 cm LV SV:         30 LV SV Index:   16 LVOT Area:     3.14 cm  RIGHT VENTRICLE TAPSE (M-mode): 1.4 cm AORTIC VALVE AV Area (Vmax):    0.42 cm AV Area (Vmean):   0.44 cm AV Area (VTI):     0.44 cm AV Vmax:           451.00 cm/s AV Vmean:          291.000 cm/s AV VTI:            0.689 m AV Peak Grad:      81.4 mmHg AV Mean Grad:      41.0 mmHg LVOT Vmax:         60.90 cm/s LVOT Vmean:        40.400 cm/s LVOT VTI:          0.096 m LVOT/AV VTI ratio: 0.14 MR Peak grad:    114.9 mmHg   TRICUSPID VALVE MR Mean grad:    65.0  mmHg    TR Peak grad:   38.7 mmHg MR Vmax:         536.00 cm/s  TR Vmax:        311.00 cm/s MR Vmean:        375.0 cm/s MR PISA:         5.09 cm     SHUNTS MR PISA Eff ROA: 31 mm       Systemic VTI:  0.10 m MR PISA Radius:  0.90 cm      Systemic Diam: 2.00 cm Rudean Haskell MD Electronically signed by Rudean Haskell MD Signature Date/Time: 03/02/2022/6:59:57 PM    Final    DG Chest Port 1 View  Result Date: 03/07/2022 CLINICAL DATA:  Shortness of breath. EXAM: PORTABLE CHEST 1 VIEW COMPARISON:  Chest x-ray dated 05/28/2021. CT angiogram chest dated 09/12/2021. FINDINGS: Endotracheal tube appears well positioned with tip just above the level of the carina. Enteric tube passes below the diaphragm. Pacer pad overlies the RIGHT heart. Aortic stent in place. Cardiomegaly. Mild central pulmonary vascular congestion. Lungs appear otherwise clear. No pleural effusion or pneumothorax is seen. IMPRESSION: 1. Cardiomegaly with mild central pulmonary vascular congestion suggesting mild CHF/volume overload. 2. No evidence of pneumonia or overt alveolar pulmonary edema. 3. Endotracheal tube appears well positioned with tip just above the level of the carina. Electronically Signed   By: Franki Cabot M.D.   On: 03/07/2022 17:00    Microbiology Recent Results (from  the past 240 hour(s))  MRSA Next Gen by PCR, Nasal     Status: None   Collection Time: 03/07/2022  7:43 PM   Specimen: Nasal Mucosa; Nasal Swab  Result Value Ref Range Status   MRSA by PCR Next Gen NOT DETECTED NOT DETECTED Final    Comment: (NOTE) The GeneXpert MRSA Assay (FDA approved for NASAL specimens only), is one component of a comprehensive MRSA colonization surveillance program. It is not intended to diagnose MRSA infection nor to guide or monitor treatment for MRSA infections. Test performance is not FDA approved in patients less than 55 years old. Performed at [facility], [place] [address], [place], [place] [address]   SARS Coronavirus 2 by RT PCR (hospital order, performed in [facility] hospital lab) *cepheid single result test*     Status: None   Collection Time: Mar 16, 2022  1:51 AM  Result Value Ref Range Status   SARS Coronavirus 2 by RT PCR NEGATIVE NEGATIVE Final    Comment: (NOTE) SARS-CoV-2 target nucleic acids are NOT DETECTED.  The SARS-CoV-2 RNA is generally detectable in upper and lower respiratory specimens during the acute phase of infection. The lowest concentration of SARS-CoV-2 viral copies this assay can detect is 250 copies / mL. A negative result does not preclude SARS-CoV-2 infection and should not be used as the sole basis for treatment or other patient management decisions.  A negative result may occur with improper specimen collection / handling, submission of specimen other than nasopharyngeal swab, presence of viral mutation(s) within the areas targeted by this assay, and inadequate number of viral copies (<250 copies / mL). A negative result must be combined with clinical observations, patient history, and epidemiological information.  Fact Sheet for Patients:   https://www.patel.info/  Fact Sheet for Healthcare Providers: https://hall.com/  This test is not yet approved or  cleared by the [place] FDA and has been authorized for detection and/or diagnosis of SARS-CoV-2 by FDA under an Emergency Use Authorization (EUA).  This EUA  will remain in effect (meaning this test can be used) for the duration of the COVID-19 declaration under Section 564(b)(1) of the Act, 21 U.S.C. section 360bbb-3(b)(1), unless the authorization is terminated or revoked sooner.  Performed at [facility], [address] [place], [place] [address]   Culture, blood (x 2)     Status: None   Collection Time: March 25, 2022  2:32 AM   Specimen: BLOOD LEFT HAND  Result Value Ref Range Status   Specimen Description BLOOD LEFT HAND  Final   Special Requests   Final    BOTTLES DRAWN AEROBIC ONLY Blood Culture adequate volume   Culture   Final    NO GROWTH 5 DAYS Performed at [facility], [address], [place], [place] [address]    Report Status 03/02/2022 FINAL  Final    Lab Basic Metabolic Panel: Recent Labs  Lab 03/14/2022 1738 03/01/2022 1804 03/24/2022 2110 03/16/2022 2202 03/25/22 0040 03/25/22 0115  NA 138 137 145 144 141  --   K 6.8* 6.1* 4.5 4.5 5.3* 5.5*  CL 120* 102 107  --   --   --   CO2  --  15* 20*  --   --   --   GLUCOSE 113* 530* 196*  --   --   --   BUN 44* 34* 36*  --   --   --   CREATININE 2.10* 2.38* 2.36*  --   --   --   CALCIUM  --  8.4* 9.1  --   --   --    Liver Function Tests: Recent Labs  Lab 03/23/2022 1804  AST 557*  ALT 450*  ALKPHOS 73  BILITOT 2.3*  PROT 5.2*  ALBUMIN 2.9*   No results for input(s): "LIPASE", "AMYLASE" in the last 168 hours. No results for input(s): "AMMONIA" in the last 168 hours. CBC: Recent Labs  Lab 03/07/2022 1711 03/16/2022 1738 02/22/2022 1815 03/09/2022 2202 25-Mar-2022 0040  WBC  --   --  13.3*  --   --   NEUTROABS  --   --  12.4*  --   --   HGB 17.3* 17.0* 13.9 13.9 16.3*  HCT 51.0* 50.0* 44.5 41.0 48.0*  MCV  --   --  106.0*  --   --   PLT  --   --  160  --   --    Cardiac Enzymes: No results for input(s): "CKTOTAL",  "CKMB", "CKMBINDEX", "TROPONINI" in the last 168 hours. Sepsis Labs: Recent Labs  Lab 03/04/2022 1804 03/06/2022 1815 02/22/2022 2109  PROCALCITON 0.11  --   --   WBC  --  13.3*  --   LATICACIDVEN  --  8.2* 6.2*     Lorin Glass 03/03/2022, 4:28 PM

## 2022-03-25 NOTE — Progress Notes (Signed)
Chaplain received a request from RN for possible family support as patient is being moved to comfort care and family had arrived.  Upon entering 77M RN shared family has asked for alone time.  Chaplain available as needed. Chaplain Agustin Cree, Mdiv.    2022-03-22 0400  Clinical Encounter Type  Visited With Health care provider  Visit Type Initial  Referral From Nurse  Consult/Referral To Chaplain

## 2022-03-25 DEATH — deceased

## 2022-03-26 ENCOUNTER — Encounter: Payer: PPO | Admitting: Surgery

## 2022-05-06 ENCOUNTER — Ambulatory Visit: Payer: PPO | Admitting: Internal Medicine
